# Patient Record
Sex: Female | Born: 1935 | Race: White | Hispanic: No | Smoking: Never smoker
Health system: Southern US, Community
[De-identification: ages and names within clinical notes are randomized; demographics above are authoritative.]

## PROBLEM LIST (undated history)

## (undated) DIAGNOSIS — I4821 Permanent atrial fibrillation: Secondary | ICD-10-CM

## (undated) DIAGNOSIS — I5033 Acute on chronic diastolic (congestive) heart failure: Secondary | ICD-10-CM

## (undated) DIAGNOSIS — E039 Hypothyroidism, unspecified: Secondary | ICD-10-CM

## (undated) DIAGNOSIS — D649 Anemia, unspecified: Secondary | ICD-10-CM

## (undated) DIAGNOSIS — E669 Obesity, unspecified: Secondary | ICD-10-CM

## (undated) DIAGNOSIS — I350 Nonrheumatic aortic (valve) stenosis: Secondary | ICD-10-CM

## (undated) DIAGNOSIS — Z952 Presence of prosthetic heart valve: Secondary | ICD-10-CM

## (undated) DIAGNOSIS — Z950 Presence of cardiac pacemaker: Secondary | ICD-10-CM

## (undated) DIAGNOSIS — C169 Malignant neoplasm of stomach, unspecified: Secondary | ICD-10-CM

## (undated) DIAGNOSIS — I739 Peripheral vascular disease, unspecified: Secondary | ICD-10-CM

## (undated) DIAGNOSIS — E785 Hyperlipidemia, unspecified: Secondary | ICD-10-CM

## (undated) DIAGNOSIS — N189 Chronic kidney disease, unspecified: Secondary | ICD-10-CM

## (undated) DIAGNOSIS — I2510 Atherosclerotic heart disease of native coronary artery without angina pectoris: Secondary | ICD-10-CM

## (undated) DIAGNOSIS — R001 Bradycardia, unspecified: Secondary | ICD-10-CM

## (undated) DIAGNOSIS — I5032 Chronic diastolic (congestive) heart failure: Secondary | ICD-10-CM

## (undated) DIAGNOSIS — R42 Dizziness and giddiness: Secondary | ICD-10-CM

## (undated) DIAGNOSIS — I6529 Occlusion and stenosis of unspecified carotid artery: Secondary | ICD-10-CM

## (undated) DIAGNOSIS — I10 Essential (primary) hypertension: Secondary | ICD-10-CM

## (undated) DIAGNOSIS — M1990 Unspecified osteoarthritis, unspecified site: Secondary | ICD-10-CM

## (undated) DIAGNOSIS — K219 Gastro-esophageal reflux disease without esophagitis: Secondary | ICD-10-CM

## (undated) HISTORY — DX: Bradycardia, unspecified: R00.1

## (undated) HISTORY — PX: WHIPPLE PROCEDURE: SHX2667

## (undated) HISTORY — DX: Obesity, unspecified: E66.9

## (undated) HISTORY — DX: Nonrheumatic aortic (valve) stenosis: I35.0

## (undated) HISTORY — PX: US ECHOCARDIOGRAPHY: HXRAD669

## (undated) HISTORY — DX: Chronic diastolic (congestive) heart failure: I50.32

## (undated) HISTORY — DX: Permanent atrial fibrillation: I48.21

## (undated) HISTORY — PX: EYE SURGERY: SHX253

## (undated) HISTORY — PX: CHOLECYSTECTOMY: SHX55

## (undated) HISTORY — PX: CAROTID ENDARTERECTOMY: SUR193

## (undated) HISTORY — DX: Occlusion and stenosis of unspecified carotid artery: I65.29

## (undated) HISTORY — PX: CATARACT EXTRACTION W/ INTRAOCULAR LENS IMPLANT: SHX1309

## (undated) HISTORY — PX: CORONARY ANGIOPLASTY: SHX604

## (undated) HISTORY — PX: VAGINAL HYSTERECTOMY: SUR661

## (undated) HISTORY — DX: Dizziness and giddiness: R42

## (undated) HISTORY — DX: Essential (primary) hypertension: I10

## (undated) HISTORY — DX: Acute on chronic diastolic (congestive) heart failure: I50.33

## (undated) HISTORY — DX: Peripheral vascular disease, unspecified: I73.9

## (undated) HISTORY — DX: Atherosclerotic heart disease of native coronary artery without angina pectoris: I25.10

## (undated) HISTORY — PX: CARDIOVASCULAR STRESS TEST: SHX262

## (undated) HISTORY — PX: INSERT / REPLACE / REMOVE PACEMAKER: SUR710

## (undated) HISTORY — DX: Hyperlipidemia, unspecified: E78.5

---

## 2012-06-28 DIAGNOSIS — K449 Diaphragmatic hernia without obstruction or gangrene: Secondary | ICD-10-CM | POA: Insufficient documentation

## 2012-06-28 DIAGNOSIS — C7A8 Other malignant neuroendocrine tumors: Secondary | ICD-10-CM | POA: Insufficient documentation

## 2012-08-09 DIAGNOSIS — I214 Non-ST elevation (NSTEMI) myocardial infarction: Secondary | ICD-10-CM | POA: Insufficient documentation

## 2012-12-10 DIAGNOSIS — D3A8 Other benign neuroendocrine tumors: Secondary | ICD-10-CM | POA: Insufficient documentation

## 2016-02-06 ENCOUNTER — Encounter: Payer: Self-pay | Admitting: Cardiology

## 2016-02-06 ENCOUNTER — Encounter (INDEPENDENT_AMBULATORY_CARE_PROVIDER_SITE_OTHER): Payer: Self-pay

## 2016-02-06 ENCOUNTER — Ambulatory Visit (INDEPENDENT_AMBULATORY_CARE_PROVIDER_SITE_OTHER): Payer: Medicare Other | Admitting: Cardiology

## 2016-02-06 VITALS — BP 162/78 | HR 57 | Ht 72.0 in | Wt 212.0 lb

## 2016-02-06 DIAGNOSIS — I4819 Other persistent atrial fibrillation: Secondary | ICD-10-CM

## 2016-02-06 DIAGNOSIS — I481 Persistent atrial fibrillation: Secondary | ICD-10-CM | POA: Diagnosis not present

## 2016-02-06 NOTE — Patient Instructions (Signed)
Medication Instructions:   Your physician recommends that you continue on your current medications as directed. Please refer to the Current Medication list given to you today.  Labwork:  none  Testing/Procedures:  none  Follow-Up:  Your physician recommends that you schedule a follow-up appointment in: 3 months.  Any Other Special Instructions Will Be Listed Below (If Applicable).  If you need a refill on your cardiac medications before your next appointment, please call your pharmacy. 

## 2016-02-06 NOTE — Progress Notes (Addendum)
Electrophysiology Office Note   Date:  02/06/2016   ID:  Theresa Cain, DOB Oct 24, 1926, MRN [id number]  PCP:  Theresa Ranch, MD  Cardiologist:  [facility] Primary Electrophysiologist:  Theresa Corso Meredith Leeds, MD    Chief Complaint  Patient presents with  . New Patient (Initial Visit)    PAF/Bradycardia     History of Present Illness: [name] is a 82 y.o. female who presents today for electrophysiology evaluation.   History of atrial fibrillation, carotid artery disease, hypertension, CVA, peripheral vascular disease. Presented to her primary cardiologist due to lightheadedness, vertigo, and she'll be headed feeling. She says that she was diagnosed with atrial fibrillation approximately a year ago. She was put on Eliquis at that time and has not missed any doses since starting the medication. She says that she has symptoms of fatigue, shortness of breath, and palpitations. She is able to do most of her daily activities without issue. According to her, she wore a monitor which showed evidence of bradycardia down to the 30s.   Today, she denies symptoms of palpitations, chest pain, orthopnea, PND, lower extremity edema, claudication, dizziness, presyncope, syncope, bleeding, or neurologic sequela. The patient is tolerating medications without difficulties and is otherwise without complaint today.    Past Medical History:  Diagnosis Date  . A-fib ([place])   . Angina at rest [facility])   . Anxiety   . Aortic valve stenosis   . Bilateral edema of lower extremity   . Bradycardia   . Carotid artery occlusion   . Hypertension   . Obesity   . PVD (peripheral vascular disease) ([place])   . SOB (shortness of breath)   . Vertigo    Past Surgical History:  Procedure Laterality Date  . CARDIOVASCULAR STRESS TEST    . CAROTID ENDARTERECTOMY    . US ECHOCARDIOGRAPHY       Current Outpatient Prescriptions  Medication Sig Dispense Refill  . amLODipine (NORVASC) 5 MG tablet Take 5 mg by  mouth daily.    Marland Kitchen apixaban (ELIQUIS) 5 MG TABS tablet Take 5 mg by mouth 2 (two) times daily.    Marland Kitchen aspirin EC 81 MG tablet Take 81 mg by mouth daily.    . Cholecalciferol (VITAMIN D3) 50000 units TABS Take 50,000 Units by mouth daily.    Marland Kitchen escitalopram (LEXAPRO) 10 MG tablet Take 10 mg by mouth daily.    Marland Kitchen gabapentin (NEURONTIN) 600 MG tablet Take 600 mg by mouth 2 (two) times daily.     . meclizine (ANTIVERT) 12.5 MG tablet Take 12.5 mg by mouth 3 (three) times daily as needed for dizziness (up to ten days).    Marland Kitchen omeprazole (PRILOSEC) 20 MG capsule Take 20 mg by mouth daily.    Marland Kitchen oxybutynin (DITROPAN-XL) 10 MG 24 hr tablet Take 10 mg by mouth at bedtime.    . pravastatin (PRAVACHOL) 40 MG tablet Take 40 mg by mouth daily.    Marland Kitchen torsemide (DEMADEX) 100 MG tablet Take 100 mg by mouth daily.     No current facility-administered medications for this visit.     Allergies:   Penicillins and Sulfa antibiotics   Social History:  The patient  reports that she has never smoked. She has never used smokeless tobacco. She reports that she does not drink alcohol or use drugs.   Family History:  The patient's family history includes Heart disease in her sister.    ROS:  Please see the history of present illness.   Otherwise, review of  systems is positive for none.   All other systems are reviewed and negative.    PHYSICAL EXAM: VS:  BP (!) 162/78   Pulse (!) 57   Ht 6' (1.829 m)   Wt 212 lb (96.2 kg)   BMI 28.75 kg/m  , BMI Body mass index is 28.75 kg/m. GEN: Well nourished, well developed, in no acute distress  HEENT: normal  Neck: no JVD, carotid bruits, or masses Cardiac: iRRR; 2/6 systolic murmur at LUSB, rubs, or gallops,no edema  Respiratory:  clear to auscultation bilaterally, normal work of breathing GI: soft, nontender, nondistended, + BS MS: no deformity or atrophy  Skin: warm and dry Neuro:  Strength and sensation are intact Psych: euthymic mood, full affect  EKG:  EKG is  ordered today. Personal review of the ekg ordered shows atrial fibrillation, rate 57  Recent Labs: No results found for requested labs within last 8760 hours.    Lipid Panel  No results found for: CHOL, TRIG, HDL, CHOLHDL, VLDL, LDLCALC, LDLDIRECT   Wt Readings from Last 3 Encounters:  02/06/16 212 lb (96.2 kg)      Other studies Reviewed: Additional studies/ records that were reviewed today include: 12/31/15 TTE Review of the above records today demonstrates:  LVEF>55% (see care everywhere)   ASSESSMENT AND PLAN:  1.  Paroxysmal atrial fibrillation: Currently anticoagulated with Eliquis. She is rate controlled on no rate controlling medicines at the time. We'll continue her on her current medical management. We did discuss the option of returning to sinus rhythm. We discussed medical management as well as cardioversion. We Tayonna Bacha await the results of the event monitor to determine the best course of action.  This patients CHA2DS2-VASc Score and unadjusted Ischemic Stroke Rate (% per year) is equal to 7.2 % stroke rate/year from a score of 5  Above score calculated as 1 point each if present [CHF, HTN, DM, Vascular=MI/PAD/Aortic Plaque, Age if 65-74, or Female] Above score calculated as 2 points each if present [Age > 75, or Stroke/TIA/TE]   2. Bradycardia: Patient reports that she has been bradycardic. Unfortunately we do not have the results of her most recent event monitor. We'll work to get those results. In the interim, I have discussed with both her and her daughter the possibility of a pacemaker being placed. Risks and benefits were discussed. Risks include bleeding, infection, tamponade, and pneumothorax, among others. They understand these risks and has agreed to the procedure if it is necessary pending monitor results.    Current medicines are reviewed at length with the patient today.   The patient does not have concerns regarding her medicines.  The following changes  were made today:  none  Labs/ tests ordered today include:  Orders Placed This Encounter  Procedures  . EKG 12-Lead     Disposition:   FU with Antwoin Lackey 3 months  Signed, Stashia Sia Meredith Leeds, MD  02/06/2016 3:53 PM     [facility] [address] [place] [address] [phone] (office) [phone] (fax)

## 2016-02-18 ENCOUNTER — Telehealth: Payer: Self-pay | Admitting: Cardiology

## 2016-02-18 NOTE — Telephone Encounter (Signed)
Advised dtr that we had not received "complete" holter monitor report.  Did inform her that we could see holter "summary" but not entire report.  Dtr will call and request they send entire holter report for review by physician.  She understands I will call her once received/reviewed.   [facility] note from care everywhere: 48 hour Holter Hookup date December 06, 2015 Indication atrial fibrillation Patient with a Holter for 48 hours Total beats 87,102 Minimum rate 22 maximum 113 average of 62 Rhythm is mostly atrial fibrillation with bradycardia Frequent PVCs Short pauses during A. Fib Significant episodes of bradycardia No significant runs of sustained tachycardia Occasional bigeminy No diary submitted . Conclusion Sick sinus syndrome with atrial fibrillation underlying rhythm Severe bradycardia down to 22 times mostly in the 50s Consider long-term anticoagulation if patient is a candidate Consider permanent pacemaker if symptoms persist

## 2016-02-18 NOTE — Telephone Encounter (Signed)
Follow Up:    Pt's daughter trying to find out if you received pt's monitor results from [facility]?

## 2016-02-20 ENCOUNTER — Telehealth: Payer: Self-pay | Admitting: *Deleted

## 2016-02-20 NOTE — Telephone Encounter (Signed)
Patients daughter calling to see if we have received complete holter monitor report from [place]. (see previous note).  Please call.

## 2016-02-21 NOTE — Telephone Encounter (Signed)
Dtr reports mom's SOB worsening and PCP is looking to see if she has PNA.  Hx of AFib, murmur also could be causative factor. Informed dtr we have received complete holter results.  She understands I will discuss mom's case w/ Dr. Curt Bears tomorrow/Monday and call her back by next week with a plan.

## 2016-02-21 NOTE — Telephone Encounter (Signed)
Call Documentation   Shelly A Wells at 02/20/2016 12:12 PM   Status: Signed    Patients daughter calling to see if we have received complete holter monitor report from [place]. (see previous note).  Please call.

## 2016-02-22 ENCOUNTER — Emergency Department (HOSPITAL_COMMUNITY): Payer: Medicare Other

## 2016-02-22 ENCOUNTER — Encounter (HOSPITAL_COMMUNITY): Payer: Self-pay | Admitting: *Deleted

## 2016-02-22 ENCOUNTER — Observation Stay (HOSPITAL_COMMUNITY)
Admission: EM | Admit: 2016-02-22 | Discharge: 2016-02-23 | Disposition: A | Discharge status: Home / Self Care | Payer: Medicare Other | Attending: Internal Medicine | Admitting: Internal Medicine

## 2016-02-22 DIAGNOSIS — T501X5A Adverse effect of loop [high-ceiling] diuretics, initial encounter: Secondary | ICD-10-CM | POA: Insufficient documentation

## 2016-02-22 DIAGNOSIS — Z7982 Long term (current) use of aspirin: Secondary | ICD-10-CM | POA: Insufficient documentation

## 2016-02-22 DIAGNOSIS — Z79899 Other long term (current) drug therapy: Secondary | ICD-10-CM | POA: Insufficient documentation

## 2016-02-22 DIAGNOSIS — R001 Bradycardia, unspecified: Secondary | ICD-10-CM | POA: Insufficient documentation

## 2016-02-22 DIAGNOSIS — D649 Anemia, unspecified: Secondary | ICD-10-CM | POA: Diagnosis not present

## 2016-02-22 DIAGNOSIS — Z8673 Personal history of transient ischemic attack (TIA), and cerebral infarction without residual deficits: Secondary | ICD-10-CM | POA: Diagnosis not present

## 2016-02-22 DIAGNOSIS — I5033 Acute on chronic diastolic (congestive) heart failure: Secondary | ICD-10-CM | POA: Diagnosis not present

## 2016-02-22 DIAGNOSIS — I739 Peripheral vascular disease, unspecified: Secondary | ICD-10-CM | POA: Diagnosis not present

## 2016-02-22 DIAGNOSIS — I350 Nonrheumatic aortic (valve) stenosis: Secondary | ICD-10-CM | POA: Insufficient documentation

## 2016-02-22 DIAGNOSIS — F419 Anxiety disorder, unspecified: Secondary | ICD-10-CM | POA: Diagnosis not present

## 2016-02-22 DIAGNOSIS — I480 Paroxysmal atrial fibrillation: Secondary | ICD-10-CM | POA: Diagnosis not present

## 2016-02-22 DIAGNOSIS — I110 Hypertensive heart disease with heart failure: Secondary | ICD-10-CM | POA: Diagnosis not present

## 2016-02-22 DIAGNOSIS — Z7901 Long term (current) use of anticoagulants: Secondary | ICD-10-CM | POA: Diagnosis not present

## 2016-02-22 DIAGNOSIS — I509 Heart failure, unspecified: Secondary | ICD-10-CM

## 2016-02-22 DIAGNOSIS — E876 Hypokalemia: Secondary | ICD-10-CM | POA: Diagnosis not present

## 2016-02-22 DIAGNOSIS — R0602 Shortness of breath: Secondary | ICD-10-CM | POA: Diagnosis present

## 2016-02-22 HISTORY — DX: Acute on chronic diastolic (congestive) heart failure: I50.33

## 2016-02-22 LAB — BASIC METABOLIC PANEL
ANION GAP: 9 (ref 5–15)
BUN: 7 mg/dL (ref 6–20)
CALCIUM: 8.5 mg/dL — AB (ref 8.9–10.3)
CO2: 26 mmol/L (ref 22–32)
Chloride: 104 mmol/L (ref 101–111)
Creatinine, Ser: 1 mg/dL (ref 0.44–1.00)
GFR, EST AFRICAN AMERICAN: 57 mL/min — AB (ref >60)
GFR, EST NON AFRICAN AMERICAN: 49 mL/min — AB (ref >60)
Glucose, Bld: 120 mg/dL — ABNORMAL HIGH (ref 65–99)
Potassium: 3.5 mmol/L (ref 3.5–5.1)
Sodium: 139 mmol/L (ref 135–145)

## 2016-02-22 LAB — BRAIN NATRIURETIC PEPTIDE: B NATRIURETIC PEPTIDE 5: 390 pg/mL — AB (ref 0.0–100.0)

## 2016-02-22 LAB — CBC
HCT: 29.7 % — ABNORMAL LOW (ref 36.0–46.0)
HEMOGLOBIN: 9 g/dL — AB (ref 12.0–15.0)
MCH: 24.5 pg — ABNORMAL LOW (ref 26.0–34.0)
MCHC: 30.3 g/dL (ref 30.0–36.0)
MCV: 80.7 fL (ref 78.0–100.0)
Platelets: 157 10*3/uL (ref 150–400)
RBC: 3.68 MIL/uL — AB (ref 3.87–5.11)
RDW: 16.7 % — ABNORMAL HIGH (ref 11.5–15.5)
WBC: 6.7 10*3/uL (ref 4.0–10.5)

## 2016-02-22 LAB — I-STAT TROPONIN, ED: TROPONIN I, POC: 0 ng/mL (ref 0.00–0.08)

## 2016-02-22 MED ORDER — PRAVASTATIN SODIUM 40 MG PO TABS
40.0000 mg | ORAL_TABLET | Freq: Every evening | ORAL | Status: DC
  Administered 2016-02-22: 40 mg via ORAL
  Filled 2016-02-22: qty 1

## 2016-02-22 MED ORDER — APIXABAN 5 MG PO TABS
5.0000 mg | ORAL_TABLET | Freq: Two times a day (BID) | ORAL | Status: DC
  Administered 2016-02-22 – 2016-02-23 (×2): 5 mg via ORAL
  Filled 2016-02-22 (×2): qty 1

## 2016-02-22 MED ORDER — AMLODIPINE BESYLATE 5 MG PO TABS
5.0000 mg | ORAL_TABLET | Freq: Every day | ORAL | Status: DC
  Administered 2016-02-23: 5 mg via ORAL
  Filled 2016-02-22: qty 1

## 2016-02-22 MED ORDER — FUROSEMIDE 10 MG/ML IJ SOLN
80.0000 mg | Freq: Once | INTRAMUSCULAR | Status: AC
  Administered 2016-02-22: 80 mg via INTRAVENOUS
  Filled 2016-02-22: qty 8

## 2016-02-22 MED ORDER — SODIUM CHLORIDE 0.9% FLUSH
3.0000 mL | Freq: Two times a day (BID) | INTRAVENOUS | Status: DC
  Administered 2016-02-22 – 2016-02-23 (×2): 3 mL via INTRAVENOUS

## 2016-02-22 MED ORDER — TORSEMIDE 20 MG PO TABS
20.0000 mg | ORAL_TABLET | Freq: Every day | ORAL | Status: DC
Start: 2016-02-23 — End: 2016-02-22

## 2016-02-22 MED ORDER — ESCITALOPRAM OXALATE 10 MG PO TABS
10.0000 mg | ORAL_TABLET | Freq: Every day | ORAL | Status: DC
  Administered 2016-02-22: 10 mg via ORAL
  Filled 2016-02-22: qty 1

## 2016-02-22 MED ORDER — ASPIRIN EC 81 MG PO TBEC
81.0000 mg | DELAYED_RELEASE_TABLET | Freq: Every day | ORAL | Status: DC
  Administered 2016-02-23: 81 mg via ORAL
  Filled 2016-02-22: qty 1

## 2016-02-22 MED ORDER — FUROSEMIDE 10 MG/ML IJ SOLN
40.0000 mg | Freq: Every day | INTRAMUSCULAR | Status: DC
  Administered 2016-02-23: 40 mg via INTRAVENOUS
  Filled 2016-02-22: qty 4

## 2016-02-22 MED ORDER — GABAPENTIN 600 MG PO TABS
1200.0000 mg | ORAL_TABLET | Freq: Two times a day (BID) | ORAL | Status: DC
  Administered 2016-02-22 – 2016-02-23 (×2): 1200 mg via ORAL
  Filled 2016-02-22 (×2): qty 2

## 2016-02-22 MED ORDER — PANTOPRAZOLE SODIUM 40 MG PO TBEC
40.0000 mg | DELAYED_RELEASE_TABLET | Freq: Every day | ORAL | Status: DC
  Administered 2016-02-23: 40 mg via ORAL
  Filled 2016-02-22: qty 1

## 2016-02-22 MED ORDER — ESCITALOPRAM OXALATE 10 MG PO TABS
ORAL_TABLET | ORAL | Status: AC
  Administered 2016-02-23: 10 mg
  Filled 2016-02-22: qty 1

## 2016-02-22 MED ORDER — LEVOFLOXACIN 500 MG PO TABS
500.0000 mg | ORAL_TABLET | Freq: Every day | ORAL | Status: DC
  Administered 2016-02-23: 500 mg via ORAL
  Filled 2016-02-22: qty 1

## 2016-02-22 MED ORDER — PRAVASTATIN SODIUM 40 MG PO TABS
ORAL_TABLET | ORAL | Status: AC
  Administered 2016-02-23: 40 mg
  Filled 2016-02-22: qty 1

## 2016-02-22 MED ORDER — SODIUM CHLORIDE 0.9 % IV SOLN: 250.0000 mL | INTRAVENOUS | Status: DC | PRN

## 2016-02-22 MED ORDER — ACETAMINOPHEN 500 MG PO TABS: 1000.0000 mg | ORAL_TABLET | Freq: Four times a day (QID) | ORAL | Status: DC | PRN

## 2016-02-22 MED ORDER — OXYBUTYNIN CHLORIDE ER 10 MG PO TB24
10.0000 mg | ORAL_TABLET | Freq: Every day | ORAL | Status: DC
  Administered 2016-02-22: 10 mg via ORAL
  Filled 2016-02-22 (×2): qty 1

## 2016-02-22 MED ORDER — ONDANSETRON HCL 4 MG/2ML IJ SOLN
4.0000 mg | Freq: Four times a day (QID) | INTRAMUSCULAR | Status: DC | PRN
Start: 2016-02-22 — End: 2016-02-23

## 2016-02-22 MED ORDER — SODIUM CHLORIDE 0.9% FLUSH: 3.0000 mL | INTRAVENOUS | Status: DC | PRN

## 2016-02-22 NOTE — ED Provider Notes (Signed)
Medical screening examination/treatment/procedure(s) were conducted as a shared visit with non-physician practitioner(s) and myself.  I personally evaluated the patient during the encounter.   EKG Interpretation  Date/Time:  Friday February 22 2016 14:47:27 EST Ventricular Rate:  76 PR Interval:    QRS Duration: 76 QT Interval:  400 QTC Calculation: 450 R Axis:   -20 Text Interpretation:  Atrial fibrillation Septal infarct , age undetermined Abnormal ECG Confirmed by Hashem Goynes  MD, Esaw Knippel ([id number]) on 02/22/2016 9:31:03 PM     Patient here with increasing shortness of breath times several days. Also noted swelling to her extremities. X-rays show CHF. Given Lasix here is doing better initially but when ambulating became more dyspneic. Will admit for diuresis   Lacretia Leigh, MD 02/22/16 2131

## 2016-02-22 NOTE — ED Notes (Signed)
Dr Gardner in room 

## 2016-02-22 NOTE — ED Notes (Signed)
Pt given a warm blanket. No questions at this time.

## 2016-02-22 NOTE — ED Notes (Signed)
This RN in room to speak with pt. Pt sitting up in the chair at the bedside and off the monitor. Pt in NAD. Pt and daughter stated to this RN that they are not happy about being admitted. This RN provided education to both about information about admission.

## 2016-02-22 NOTE — H&P (Signed)
History and Physical    Theresa Cain [id number] DOB: 1926-04-07 DOA: 02/22/2016   PCP: Kendrick Ranch, MD Chief Complaint:  Chief Complaint  Patient presents with  . Shortness of Breath  . Chest Pain    HPI: Theresa Cain is a 82 y.o. female with medical history significant of a.fib, AVS, bradycardia currently being evaluated for pacer.  Patient hadnt been feeling well over last week.  Got put on levaquin.  Thought she was getting dehydrated so stopped her torsemide on her own.  She became increasingly SOB since stopping torsemide over the weekend.  Presents to ED.  ED Course: CHF.  Review of Systems: As per HPI otherwise 10 point review of systems negative.    Past Medical History:  Diagnosis Date  . A-fib ([place])   . Angina at rest [facility])   . Anxiety   . Aortic valve stenosis   . Bilateral edema of lower extremity   . Bradycardia   . Carotid artery occlusion   . Hypertension   . Obesity   . PVD (peripheral vascular disease) ([place])   . SOB (shortness of breath)   . Vertigo     Past Surgical History:  Procedure Laterality Date  . CARDIOVASCULAR STRESS TEST    . CAROTID ENDARTERECTOMY    . US ECHOCARDIOGRAPHY       reports that she has never smoked. She has never used smokeless tobacco. She reports that she does not drink alcohol or use drugs.  Allergies  Allergen Reactions  . Penicillins Hives  . Sulfa Antibiotics Hives    Family History  Problem Relation Age of Onset  . Heart disease Sister       Prior to Admission medications   Medication Sig Start Date End Date Taking? Authorizing Provider  acetaminophen (TYLENOL) 500 MG tablet Take 1,000 mg by mouth every 6 (six) hours as needed for moderate pain.   Yes Historical Provider, MD  amLODipine (NORVASC) 5 MG tablet Take 5 mg by mouth daily.   Yes Historical Provider, MD  apixaban (ELIQUIS) 5 MG TABS tablet Take 5 mg by mouth 2 (two) times daily.   Yes Historical Provider, MD  aspirin EC 81 MG  tablet Take 81 mg by mouth daily.   Yes Historical Provider, MD  DM-Doxylamine-Acetaminophen (NYQUIL COLD & FLU PO) Take 10 mLs by mouth at bedtime as needed (COLD).   Yes Historical Provider, MD  escitalopram (LEXAPRO) 10 MG tablet Take 10 mg by mouth at bedtime.    Yes Historical Provider, MD  gabapentin (NEURONTIN) 600 MG tablet Take 1,200 mg by mouth 2 (two) times daily.    Yes Historical Provider, MD  levofloxacin (LEVAQUIN) 500 MG tablet Take 500 mg by mouth daily.   Yes Historical Provider, MD  Multiple Vitamin (MULTIVITAMIN WITH MINERALS) TABS tablet Take 1 tablet by mouth daily.   Yes Historical Provider, MD  omeprazole (PRILOSEC) 20 MG capsule Take 20 mg by mouth daily.   Yes Historical Provider, MD  oxybutynin (DITROPAN-XL) 10 MG 24 hr tablet Take 10 mg by mouth at bedtime.   Yes Historical Provider, MD  pravastatin (PRAVACHOL) 40 MG tablet Take 40 mg by mouth every evening.    Yes Historical Provider, MD  torsemide (DEMADEX) 20 MG tablet Take 20 mg by mouth daily.   Yes Historical Provider, MD  Vitamin D, Ergocalciferol, (DRISDOL) 50000 units CAPS capsule Take 50,000 Units by mouth 2 (two) times a week.   Yes Historical Provider, MD    Physical Exam: Vitals:  02/22/16 2030 02/22/16 2115 02/22/16 2145 02/22/16 2200  BP: 169/90 156/79 147/74 167/57  Pulse: 61 80 73 80  Resp: 22 17 19 17   Temp:      TempSrc:      SpO2: 98% 94% 99% 96%  Height:          Constitutional: NAD, calm, comfortable Eyes: PERRL, lids and conjunctivae normal ENMT: Mucous membranes are moist. Posterior pharynx clear of any exudate or lesions.Normal dentition.  Neck: normal, supple, no masses, no thyromegaly Respiratory: clear to auscultation bilaterally, no wheezing, no crackles. Normal respiratory effort. No accessory muscle use.  Cardiovascular: Regular rate and rhythm, no murmurs / rubs / gallops. No extremity edema. 2+ pedal pulses. No carotid bruits.  Abdomen: no tenderness, no masses palpated.  No hepatosplenomegaly. Bowel sounds positive.  Musculoskeletal: no clubbing / cyanosis. No joint deformity upper and lower extremities. Good ROM, no contractures. Normal muscle tone.  Skin: no rashes, lesions, ulcers. No induration Neurologic: CN 2-12 grossly intact. Sensation intact, DTR normal. Strength 5/5 in all 4.  Psychiatric: Normal judgment and insight. Alert and oriented x 3. Normal mood.    Labs on Admission: I have personally reviewed following labs and imaging studies  CBC:  Recent Labs Lab 02/22/16 1454  WBC 6.7  HGB 9.0*  HCT 29.7*  MCV 80.7  PLT [id number]   Basic Metabolic Panel:  Recent Labs Lab 02/22/16 1454  NA 139  K 3.5  CL 104  CO2 26  GLUCOSE 120*  BUN 7  CREATININE 1.00  CALCIUM 8.5*   GFR: CrCl cannot be calculated (Unknown ideal weight.). Liver Function Tests: No results for input(s): AST, ALT, ALKPHOS, BILITOT, PROT, ALBUMIN in the last 168 hours. No results for input(s): LIPASE, AMYLASE in the last 168 hours. No results for input(s): AMMONIA in the last 168 hours. Coagulation Profile: No results for input(s): INR, PROTIME in the last 168 hours. Cardiac Enzymes: No results for input(s): CKTOTAL, CKMB, CKMBINDEX, TROPONINI in the last 168 hours. BNP (last 3 results) No results for input(s): PROBNP in the last 8760 hours. HbA1C: No results for input(s): HGBA1C in the last 72 hours. CBG: No results for input(s): GLUCAP in the last 168 hours. Lipid Profile: No results for input(s): CHOL, HDL, LDLCALC, TRIG, CHOLHDL, LDLDIRECT in the last 72 hours. Thyroid Function Tests: No results for input(s): TSH, T4TOTAL, FREET4, T3FREE, THYROIDAB in the last 72 hours. Anemia Panel: No results for input(s): VITAMINB12, FOLATE, FERRITIN, TIBC, IRON, RETICCTPCT in the last 72 hours. Urine analysis: No results found for: COLORURINE, APPEARANCEUR, LABSPEC, PHURINE, GLUCOSEU, HGBUR, BILIRUBINUR, KETONESUR, PROTEINUR, UROBILINOGEN, NITRITE, LEUKOCYTESUR Sepsis  Labs: @LABRCNTIP (procalcitonin:4,lacticidven:4) )No results found for this or any previous visit (from the past 240 hour(s)).   Radiological Exams on Admission: Dg Chest 2 View  Result Date: 02/22/2016 CLINICAL DATA:  Cough for 8 days EXAM: CHEST  2 VIEW COMPARISON:  None. FINDINGS: There is interstitial pulmonary edema. There is no airspace consolidation. There is cardiomegaly with pulmonary venous hypertension. There is atherosclerotic calcification in the aorta. No adenopathy. There is degenerative change in the thoracic spine. There is postoperative change in the right neck region. IMPRESSION: There is evidence of a degree of congestive heart failure. There is aortic atherosclerosis. Electronically Signed   By: Lowella Grip III M.D.   On: 02/22/2016 15:56    EKG: Independently reviewed.  Assessment/Plan Active Problems:   Acute on chronic diastolic CHF (congestive heart failure) ([place])    1. Acute on chronic diastolic CHF - 1.  Admitting for overnight obs 2. CHF pathway 3. Lasix 40mg  IV daily, got 40mg  IV in ED with large volume output 4. Patient actually feels better and wants to go home but EDP is concerned about desat with ambulation to upper 80s. 5. EF 55% in October   DVT prophylaxis: Eliquis Code Status: Full Family Communication: Daughter at bedside Consults called: None Admission status: Admit to obs   Rula Ken[place], [facility] Pager [phone] from 7PM-7AM  If 7AM-7PM, please contact the day physician for the patient [email or web address] Password [facility]  02/22/2016, 10:49 PM

## 2016-02-22 NOTE — ED Triage Notes (Signed)
Pt reports sob since Tuesday. Denies swelling to extremities. Has been to pcp and had xray done which showed fluid on her lungs. Denies hx of chf. Has occ chest pains. ekg done at triage and no resp distress noted, pt is able to speak in full sentences.

## 2016-02-22 NOTE — ED Provider Notes (Signed)
[facility] DEPT Provider Note   CSN: [id number] Arrival date & time: 02/22/16  1442     History   Chief Complaint Chief Complaint  Patient presents with  . Shortness of Breath  . Chest Pain   HPI  Blood pressure 153/76, pulse 75, temperature 98.1 F (36.7 C), temperature source Oral, resp. rate 17, height 5\' 11"  (1.803 m), SpO2 94 %.  Theresa Cain is a 82 y.o. female with past medical history of A. fib with bradycardia (anticoagulated with Xarelto, scheduled pacemaker pending echo), aortic valve stenosis, hypertension, PVD complaining of shortness of breath worsening over the course of several days associated with dry cough no fever chills, reduced by mouth intake. Patient self DC'd her torsemide because she felt like she wasn't drinking a lot of water. She denies orthopnea, worsening peripheral edema. Endorses PND. Has fleeting chest pain, no active chest pain at this time. Patient was started on Levaquin for presumed pneumonia, she's had 2 doses of 500 mg.  Past Medical History:  Diagnosis Date  . A-fib ([place])   . Angina at rest [facility])   . Anxiety   . Aortic valve stenosis   . Bilateral edema of lower extremity   . Bradycardia   . Carotid artery occlusion   . Hypertension   . Obesity   . PVD (peripheral vascular disease) ([place])   . SOB (shortness of breath)   . Vertigo     There are no active problems to display for this patient.   Past Surgical History:  Procedure Laterality Date  . CARDIOVASCULAR STRESS TEST    . CAROTID ENDARTERECTOMY    . US ECHOCARDIOGRAPHY      OB History    No data available       Home Medications    Prior to Admission medications   Medication Sig Start Date End Date Taking? Authorizing Provider  acetaminophen (TYLENOL) 500 MG tablet Take 1,000 mg by mouth every 6 (six) hours as needed for moderate pain.   Yes Historical Provider, MD  amLODipine (NORVASC) 5 MG tablet Take 5 mg by mouth daily.   Yes Historical Provider, MD  apixaban  (ELIQUIS) 5 MG TABS tablet Take 5 mg by mouth 2 (two) times daily.   Yes Historical Provider, MD  aspirin EC 81 MG tablet Take 81 mg by mouth daily.   Yes Historical Provider, MD  DM-Doxylamine-Acetaminophen (NYQUIL COLD & FLU PO) Take 10 mLs by mouth at bedtime as needed (COLD).   Yes Historical Provider, MD  escitalopram (LEXAPRO) 10 MG tablet Take 10 mg by mouth at bedtime.    Yes Historical Provider, MD  gabapentin (NEURONTIN) 600 MG tablet Take 1,200 mg by mouth 2 (two) times daily.    Yes Historical Provider, MD  levofloxacin (LEVAQUIN) 500 MG tablet Take 500 mg by mouth daily.   Yes Historical Provider, MD  Multiple Vitamin (MULTIVITAMIN WITH MINERALS) TABS tablet Take 1 tablet by mouth daily.   Yes Historical Provider, MD  omeprazole (PRILOSEC) 20 MG capsule Take 20 mg by mouth daily.   Yes Historical Provider, MD  oxybutynin (DITROPAN-XL) 10 MG 24 hr tablet Take 10 mg by mouth at bedtime.   Yes Historical Provider, MD  pravastatin (PRAVACHOL) 40 MG tablet Take 40 mg by mouth every evening.    Yes Historical Provider, MD  torsemide (DEMADEX) 20 MG tablet Take 20 mg by mouth daily.   Yes Historical Provider, MD  Vitamin D, Ergocalciferol, (DRISDOL) 50000 units CAPS capsule Take 50,000 Units by mouth  2 (two) times a week.   Yes Historical Provider, MD    Family History Family History  Problem Relation Age of Onset  . Heart disease Sister     Social History Social History  Substance Use Topics  . Smoking status: Never Smoker  . Smokeless tobacco: Never Used  . Alcohol use No     Allergies   Penicillins and Sulfa antibiotics   Review of Systems Review of Systems  10 systems reviewed and found to be negative, except as noted in the HPI.  Physical Exam Updated Vital Signs BP 169/90   Pulse 61   Temp 98.1 F (36.7 C) (Oral)   Resp 22   Ht 5\' 11"  (1.803 m)   LMP  (Exact Date)   SpO2 98%   Physical Exam  Constitutional: She is oriented to person, place, and time. She  appears well-developed and well-nourished. No distress.  HENT:  Head: Normocephalic and atraumatic.  Mouth/Throat: Oropharynx is clear and moist.  Eyes: Conjunctivae and EOM are normal. Pupils are equal, round, and reactive to light.  Neck: Normal range of motion.  Cardiovascular: Normal rate, regular rhythm and intact distal pulses.   Murmur heard. Pulmonary/Chest: Effort normal and breath sounds normal.  Abdominal: Soft. There is no tenderness.  Musculoskeletal: Normal range of motion. She exhibits edema.  1+ pitting edema to ankles bilaterally  Neurological: She is alert and oriented to person, place, and time.  Skin: She is not diaphoretic.  Psychiatric: She has a normal mood and affect.  Nursing note and vitals reviewed.    ED Treatments / Results  Labs (all labs ordered are listed, but only abnormal results are displayed) Labs Reviewed  BASIC METABOLIC PANEL - Abnormal; Notable for the following:       Result Value   Glucose, Bld 120 (*)    Calcium 8.5 (*)    GFR calc non Af Amer 49 (*)    GFR calc Af Amer 57 (*)    All other components within normal limits  CBC - Abnormal; Notable for the following:    RBC 3.68 (*)    Hemoglobin 9.0 (*)    HCT 29.7 (*)    MCH 24.5 (*)    RDW 16.7 (*)    All other components within normal limits  BRAIN NATRIURETIC PEPTIDE - Abnormal; Notable for the following:    B Natriuretic Peptide 390.0 (*)    All other components within normal limits  I-STAT TROPOININ, ED    EKG  EKG Interpretation  Date/Time:  Friday February 22 2016 14:47:27 EST Ventricular Rate:  76 PR Interval:    QRS Duration: 76 QT Interval:  400 QTC Calculation: 450 R Axis:   -20 Text Interpretation:  Atrial fibrillation Septal infarct , age undetermined Abnormal ECG Confirmed by Zenia Resides  MD, ANTHONY ([id number]) on 02/22/2016 9:31:03 PM       Radiology Dg Chest 2 View  Result Date: 02/22/2016 CLINICAL DATA:  Cough for 8 days EXAM: CHEST  2 VIEW COMPARISON:   None. FINDINGS: There is interstitial pulmonary edema. There is no airspace consolidation. There is cardiomegaly with pulmonary venous hypertension. There is atherosclerotic calcification in the aorta. No adenopathy. There is degenerative change in the thoracic spine. There is postoperative change in the right neck region. IMPRESSION: There is evidence of a degree of congestive heart failure. There is aortic atherosclerosis. Electronically Signed   By: Lowella Grip III M.D.   On: 02/22/2016 15:56    Procedures Procedures (including critical  care time)  Medications Ordered in ED Medications  levofloxacin (LEVAQUIN) tablet 500 mg (not administered)  apixaban (ELIQUIS) tablet 5 mg (not administered)  amLODipine (NORVASC) tablet 5 mg (not administered)  acetaminophen (TYLENOL) tablet 1,000 mg (not administered)  aspirin EC tablet 81 mg (not administered)  escitalopram (LEXAPRO) tablet 10 mg (not administered)  gabapentin (NEURONTIN) tablet 1,200 mg (not administered)  torsemide (DEMADEX) tablet 20 mg (not administered)  pravastatin (PRAVACHOL) tablet 40 mg (not administered)  oxybutynin (DITROPAN-XL) 24 hr tablet 10 mg (not administered)  pantoprazole (PROTONIX) EC tablet 40 mg (not administered)  furosemide (LASIX) injection 80 mg (80 mg Intravenous Given 02/22/16 1933)     Initial Impression / Assessment and Plan / ED Course  I have reviewed the triage vital signs and the nursing notes.  Pertinent labs & imaging results that were available during my care of the patient were reviewed by me and considered in my medical decision making (see chart for details).  Clinical Course     Vitals:   02/22/16 1900 02/22/16 1930 02/22/16 2015 02/22/16 2030  BP: 164/96 163/86 (!) 135/101 169/90  Pulse: 80 85 79 61  Resp: 21 25 17 22   Temp:      TempSrc:      SpO2: 93% 94% 98% 98%  Height:        Medications  levofloxacin (LEVAQUIN) tablet 500 mg (not administered)  apixaban (ELIQUIS)  tablet 5 mg (not administered)  amLODipine (NORVASC) tablet 5 mg (not administered)  acetaminophen (TYLENOL) tablet 1,000 mg (not administered)  aspirin EC tablet 81 mg (not administered)  escitalopram (LEXAPRO) tablet 10 mg (not administered)  gabapentin (NEURONTIN) tablet 1,200 mg (not administered)  torsemide (DEMADEX) tablet 20 mg (not administered)  pravastatin (PRAVACHOL) tablet 40 mg (not administered)  oxybutynin (DITROPAN-XL) 24 hr tablet 10 mg (not administered)  pantoprazole (PROTONIX) EC tablet 40 mg (not administered)  furosemide (LASIX) injection 80 mg (80 mg Intravenous Given 02/22/16 1933)    Theresa Cain is 82 y.o. female presenting with Shortness of breath, she has intermittent chest pain but she is chest pain-free at this time. This patient self DC'd her torsemide because she felt like she wasn't drinking enough water to necessitate a water pill. Chest x-rays consistent with CHF. Patient is given IV Lasix 80 mg and she has urinated quite a bit, she reports significant subjective improvement however, when patient ambulates she stays around 87%. Will need admission for further diuresis. Case discussed with Dr. Alcario Drought who accepts admission.  Final Clinical Impressions(s) / ED Diagnoses   Final diagnoses:  Acute on chronic congestive heart failure, unspecified congestive heart failure type [facility])    New Prescriptions New Prescriptions   No medications on file     Monico Blitz, PA-C 02/22/16 2217    Forde Dandy, MD 02/23/16 [id number]

## 2016-02-22 NOTE — ED Notes (Signed)
Dr Alcario Drought, This RN and Elmyra Ricks PA in room to speak with pt and daughter about admission and pt and

## 2016-02-23 DIAGNOSIS — I5033 Acute on chronic diastolic (congestive) heart failure: Secondary | ICD-10-CM | POA: Diagnosis not present

## 2016-02-23 DIAGNOSIS — I110 Hypertensive heart disease with heart failure: Secondary | ICD-10-CM | POA: Diagnosis not present

## 2016-02-23 LAB — BASIC METABOLIC PANEL
ANION GAP: 11 (ref 5–15)
BUN: 8 mg/dL (ref 6–20)
CALCIUM: 8.2 mg/dL — AB (ref 8.9–10.3)
CHLORIDE: 101 mmol/L (ref 101–111)
CO2: 27 mmol/L (ref 22–32)
Creatinine, Ser: 1.03 mg/dL — ABNORMAL HIGH (ref 0.44–1.00)
GFR calc non Af Amer: 47 mL/min — ABNORMAL LOW (ref >60)
GFR, EST AFRICAN AMERICAN: 55 mL/min — AB (ref >60)
GLUCOSE: 126 mg/dL — AB (ref 65–99)
POTASSIUM: 3.1 mmol/L — AB (ref 3.5–5.1)
Sodium: 139 mmol/L (ref 135–145)

## 2016-02-23 MED ORDER — POTASSIUM CHLORIDE CRYS ER 20 MEQ PO TBCR
40.0000 meq | EXTENDED_RELEASE_TABLET | Freq: Once | ORAL | Status: AC
  Administered 2016-02-23: 40 meq via ORAL
  Filled 2016-02-23: qty 2

## 2016-02-23 MED ORDER — INFLUENZA VAC SPLIT QUAD 0.5 ML IM SUSY: 0.5000 mL | PREFILLED_SYRINGE | INTRAMUSCULAR | Status: DC

## 2016-02-23 NOTE — Progress Notes (Signed)
SATURATION QUALIFICATIONS: (This note is used to comply with regulatory documentation for home oxygen)  Patient Saturations on Room Air at Rest = 92%  Patient Saturations on Room Air while Ambulating = 92%   

## 2016-02-23 NOTE — Discharge Instructions (Signed)
Heart Failure °Heart failure is a condition in which the heart has trouble pumping blood because it has become weak or stiff. This means that the heart does not pump blood efficiently for the body to work well. For some people with heart failure, fluid may back up into the lungs and there may be swelling (edema) in the lower legs. Heart failure is usually a long-term (chronic) condition. It is important for you to take good care of yourself and follow the treatment plan from your health care provider. °What are the causes? °This condition is caused by some health problems, including: °· High blood pressure (hypertension). Hypertension causes the heart muscle to work harder than normal. High blood pressure eventually causes the heart to become stiff and weak. °· Coronary artery disease (CAD). CAD is the buildup of cholesterol and fat (plaques) in the arteries of the heart. °· Heart attack (myocardial infarction). Injured tissue, which is caused by the heart attack, does not contract as well and the heart's ability to pump blood is weakened. °· Abnormal heart valves. When the heart valves do not open and close properly, the heart muscle must pump harder to keep the blood flowing. °· Heart muscle disease (cardiomyopathy or myocarditis). Heart muscle disease is damage to the heart muscle from a variety of causes, such as drug or alcohol abuse, infections, or unknown causes. These can increase the risk of heart failure. °· Lung disease. When the lungs do not work properly, the heart must work harder. ° °What increases the risk? °Risk of heart failure increases as a person ages. This condition is also more likely to develop in people who: °· Are overweight. °· Are female. °· Smoke or chew tobacco. °· Abuse alcohol or illegal drugs. °· Have taken medicines that can damage the heart, such as chemotherapy drugs. °· Have diabetes. °? High blood sugar (glucose) is associated with high fat (lipid) levels in the blood. °? Diabetes  can also damage tiny blood vessels that carry nutrients to the heart muscle. °· Have abnormal heart rhythms. °· Have thyroid problems. °· Have low blood counts (anemia). ° °What are the signs or symptoms? °Symptoms of this condition include: °· Shortness of breath with activity, such as when climbing stairs. °· Persistent cough. °· Swelling of the feet, ankles, legs, or abdomen. °· Unexplained weight gain. °· Difficulty breathing when lying flat (orthopnea). °· Waking from sleep because of the need to sit up and get more air. °· Rapid heartbeat. °· Fatigue and loss of energy. °· Feeling light-headed, dizzy, or close to fainting. °· Loss of appetite. °· Nausea. °· Increased urination during the night (nocturia). °· Confusion. ° °How is this diagnosed? °This condition is diagnosed based on: °· Medical history, symptoms, and a physical exam. °· Diagnostic tests, which may include: °? Echocardiogram. °? Electrocardiogram (ECG). °? Chest X-ray. °? Blood tests. °? Exercise stress test. °? Radionuclide scans. °? Cardiac catheterization and angiogram. ° °How is this treated? °Treatment for this condition is aimed at managing the symptoms of heart failure. Medicines, behavioral changes, or other treatments may be necessary to treat heart failure. °Medicines °These may include: °· Angiotensin-converting enzyme (ACE) inhibitors. This type of medicine blocks the effects of a blood protein called angiotensin-converting enzyme. ACE inhibitors relax (dilate) the blood vessels and help to lower blood pressure. °· Angiotensin receptor blockers (ARBs). This type of medicine blocks the actions of a blood protein called angiotensin. ARBs dilate the blood vessels and help to lower blood pressure. °· Water   pills (diuretics). Diuretics cause the kidneys to remove salt and water from the blood. The extra fluid is removed through urination, leaving a lower volume of blood that the heart has to pump. °· Beta blockers. These improve heart  muscle strength and they prevent the heart from beating too quickly. °· Digoxin. This increases the force of the heartbeat. ° °Healthy behavior changes °These may include: °· Reaching and maintaining a healthy weight. °· Stopping smoking or chewing tobacco. °· Eating heart-healthy foods. °· Limiting or avoiding alcohol. °· Stopping use of street drugs (illegal drugs). °· Physical activity. ° °Other treatments °These may include: °· Surgery to open blocked coronary arteries or repair damaged heart valves. °· Placement of a biventricular pacemaker to improve heart muscle function (cardiac resynchronization therapy). This device paces both the right ventricle and left ventricle. °· Placement of a device to treat serious abnormal heart rhythms (implantable cardioverter defibrillator, or ICD). °· Placement of a device to improve the pumping ability of the heart (left ventricular assist device, or LVAD). °· Heart transplant. This can cure heart failure, and it is considered for certain patients who do not improve with other therapies. ° °Follow these instructions at home: °Medicines °· Take over-the-counter and prescription medicines only as told by your health care provider. Medicines are important in reducing the workload of your heart, slowing the progression of heart failure, and improving your symptoms. °? Do not stop taking your medicine unless your health care provider told you to do that. °? Do not skip any dose of medicine. °? Refill your prescriptions before you run out of medicine. You need your medicines every day. °Eating and drinking ° °· Eat heart-healthy foods. Talk with a dietitian to make an eating plan that is right for you. °? Choose foods that contain no trans fat and are low in saturated fat and cholesterol. Healthy choices include fresh or frozen fruits and vegetables, fish, lean meats, legumes, fat-free or low-fat dairy products, and whole-grain or high-fiber foods. °? Limit salt (sodium) if  directed by your health care provider. Sodium restriction may reduce symptoms of heart failure. Ask a dietitian to recommend heart-healthy seasonings. °? Use healthy cooking methods instead of frying. Healthy methods include roasting, grilling, broiling, baking, poaching, steaming, and stir-frying. °· Limit your fluid intake if directed by your health care provider. Fluid restriction may reduce symptoms of heart failure. °Lifestyle °· Stop smoking or using chewing tobacco. Nicotine and tobacco can damage your heart and your blood vessels. Do not use nicotine gum or patches before talking to your health care provider. °· Limit alcohol intake to no more than 1 drink per day for non-pregnant women and 2 drinks per day for men. One drink equals 12 oz of beer, 5 oz of wine, or 1½ oz of hard liquor. °? Drinking more than that is harmful to your heart. Tell your health care provider if you drink alcohol several times a week. °? Talk with your health care provider about whether any level of alcohol use is safe for you. °? If your heart has already been damaged by alcohol or you have severe heart failure, drinking alcohol should be stopped completely. °· Stop use of illegal drugs. °· Lose weight if directed by your health care provider. Weight loss may reduce symptoms of heart failure. °· Do moderate physical activity if directed by your health care provider. People who are elderly and people with severe heart failure should consult with a health care provider for physical activity recommendations. °  Monitor important information °· Weigh yourself every day. Keeping track of your weight daily helps you to notice excess fluid sooner. °? Weigh yourself every morning after you urinate and before you eat breakfast. °? Wear the same amount of clothing each time you weigh yourself. °? Record your daily weight. Provide your health care provider with your weight record. °· Monitor and record your blood pressure as told by your health  care provider. °· Check your pulse as told by your health care provider. °Dealing with extreme temperatures °· If the weather is extremely hot: °? Avoid vigorous physical activity. °? Use air conditioning or fans or seek a cooler location. °? Avoid caffeine and alcohol. °? Wear loose-fitting, lightweight, and light-colored clothing. °· If the weather is extremely cold: °? Avoid vigorous physical activity. °? Layer your clothes. °? Wear mittens or gloves, a hat, and a scarf when you go outside. °? Avoid alcohol. °General instructions °· Manage other health conditions such as hypertension, diabetes, thyroid disease, or abnormal heart rhythms as told by your health care provider. °· Learn to manage stress. If you need help to do this, ask your health care provider. °· Plan rest periods when fatigued. °· Get ongoing education and support as needed. °· Participate in or seek rehabilitation as needed to maintain or improve independence and quality of life. °· Stay up to date with immunizations. Keeping current on pneumococcal and influenza immunizations is especially important to prevent respiratory infections. °· Keep all follow-up visits as told by your health care provider. This is important. °Contact a health care provider if: °· You have a rapid weight gain. °· You have increasing shortness of breath that is unusual for you. °· You are unable to participate in your usual physical activities. °· You tire easily. °· You cough more than normal, especially with physical activity. °· You have any swelling or more swelling in areas such as your hands, feet, ankles, or abdomen. °· You are unable to sleep because it is hard to breathe. °· You feel like your heart is beating quickly (palpitations). °· You become dizzy or light-headed when you stand up. °Get help right away if: °· You have difficulty breathing. °· You notice or your family notices a change in your awareness, such as having trouble staying awake or having  difficulty with concentration. °· You have pain or discomfort in your chest. °· You have an episode of fainting (syncope). °This information is not intended to replace advice given to you by your health care provider. Make sure you discuss any questions you have with your health care provider. °Document Released: 03/10/2005 Document Revised: 11/13/2015 Document Reviewed: 10/03/2015 °[employer] Interactive Patient Education © 2017 [employer] ° °

## 2016-02-23 NOTE — Discharge Summary (Signed)
Pt got discharged to home, discharge instructions provided and patient showed understanding to it, IV taken out,Telemonitor DC,pt left unit in wheelchair with all of the belongings accompanied with a family member (daughter) 

## 2016-02-23 NOTE — Discharge Summary (Signed)
Physician Discharge Summary  Theresa Cain [id number] DOB: 13-Jan-1927  PCP: Kendrick Ranch, MD  Admit date: 02/22/2016 Discharge date: 02/23/2016  Recommendations for Outpatient Follow-up:  1. Dr. Cristie Hem, PCP in 3 days with repeat labs (CBC & BMP). 2. Dr. Constance Haw, EP Cardiology  Home Health: None Equipment/Devices: None    Discharge Condition: Improved and stable.  CODE STATUS: Full  Diet recommendation: Heart healthy diet.  Discharge Diagnoses:  Active Problems:   Acute on chronic diastolic CHF (congestive heart failure) (HCC)   Brief/Interim Summary: 82 year old female with PMH of atrial fibrillation, carotid artery disease, hypertension, CVA, peripheral vascular disease, aortic valve stenosis, being evaluated by EP cardiology for bradycardia, appears to have had an URTI for which she was placed on levofloxacin, had poor appetite and felt that continuing her diuretics would make her more dehydrated and hence voluntarily stopped taking torsemide for 4-5 days and presented to [facility] ED with 3-4 days of progressive dyspnea. She was admitted for decompensated CHF.  Assessment and plan:  1. Acute on chronic diastolic CHF: Precipitated by missing diuretics in the context of URTI. Patient follows with primary Cardiologist, Dr. Clayborn Bigness at the [facility] clinic. As per EP cardiologist's note 02/06/16, TTE 01/10/16 showed LVEF >55%. POC troponin 1 negative. BNP 390. EKG showed A. fib with controlled ventricular rate 76/m, normal axis, Q waves in V1-2 and QTC 450 ms. She received 2 doses of IV Lasix 40 mg with good effect. -2.4 L since admission. Dyspnea resolved and not hypoxic with ambulation on room air. Patient was counseled to resume her home dose of torsemide from tomorrow and regarding compliance with medications. She verbalized understanding.  2. Hypokalemia: Secondary to diuresis. Replaced prior to discharge. 3. Paroxysmal A. fib: Controlled  ventricular rate without bradyarrhythmias. Not on beta blockers. Continue Eliquis. CHA2DS2-VASc Score : 5  4. History of bradycardia: Being evaluated by outpatient EP cardiology. They were waiting on most recent event monitor results and plan follow-up. 5. Essential hypertension: Mildly uncontrolled at times. Continue home regimen of amlodipine. 6. History of CVA: Patient has been on low-dose aspirin and Eliquis prior to admission. Follow-up with PCP or her cardiologist to determine if aspirin can be discontinued due to increased bleeding risk. 7. Anemia: Hemoglobin 9. No prior results to compare. May be chronic. Follow with PCP with repeat CBC.   Discharge Instructions  Discharge Instructions    (HEART FAILURE PATIENTS) Call MD:  Anytime you have any of the following symptoms: 1) 3 pound weight gain in 24 hours or 5 pounds in 1 week 2) shortness of breath, with or without a dry hacking cough 3) swelling in the hands, feet or stomach 4) if you have to sleep on extra pillows at night in order to breathe.    Complete by:  As directed    Call MD for:  extreme fatigue    Complete by:  As directed    Call MD for:  persistant dizziness or light-headedness    Complete by:  As directed    Call MD for:  severe uncontrolled pain    Complete by:  As directed    Diet - low sodium heart healthy    Complete by:  As directed    Increase activity slowly    Complete by:  As directed        Medication List    TAKE these medications   acetaminophen 500 MG tablet Commonly known as:  TYLENOL Take 1,000 mg by mouth every 6 (six)  hours as needed for moderate pain.   amLODipine 5 MG tablet Commonly known as:  NORVASC Take 5 mg by mouth daily.   aspirin EC 81 MG tablet Take 81 mg by mouth daily.   ELIQUIS 5 MG Tabs tablet Generic drug:  apixaban Take 5 mg by mouth 2 (two) times daily.   escitalopram 10 MG tablet Commonly known as:  LEXAPRO Take 10 mg by mouth at bedtime.   gabapentin 600 MG  tablet Commonly known as:  NEURONTIN Take 1,200 mg by mouth 2 (two) times daily.   levofloxacin 500 MG tablet Commonly known as:  LEVAQUIN Take 500 mg by mouth daily.   multivitamin with minerals Tabs tablet Take 1 tablet by mouth daily.   NYQUIL COLD & FLU PO Take 10 mLs by mouth at bedtime as needed (COLD).   omeprazole 20 MG capsule Commonly known as:  PRILOSEC Take 20 mg by mouth daily.   oxybutynin 10 MG 24 hr tablet Commonly known as:  DITROPAN-XL Take 10 mg by mouth at bedtime.   pravastatin 40 MG tablet Commonly known as:  PRAVACHOL Take 40 mg by mouth every evening.   torsemide 20 MG tablet Commonly known as:  DEMADEX Take 20 mg by mouth daily.   Vitamin D (Ergocalciferol) 50000 units Caps capsule Commonly known as:  DRISDOL Take 50,000 Units by mouth 2 (two) times a week.      Follow-up Information    Kendrick Ranch, MD. Schedule an appointment as soon as possible for a visit in 3 day(s).   Specialty:  Internal Medicine Why:  To be seen with repeat labs (CBC & BMP). Contact information: [place] [phone]        Will Meredith Leeds, MD. Schedule an appointment as soon as possible for a visit.   Specialty:  Cardiology Contact information: [address] [place] [address] [phone]          Allergies  Allergen Reactions  . Penicillins Hives  . Sulfa Antibiotics Hives    Consultations:  None   Procedures/Studies: Dg Chest 2 View  Result Date: 02/22/2016 CLINICAL DATA:  Cough for 8 days EXAM: CHEST  2 VIEW COMPARISON:  None. FINDINGS: There is interstitial pulmonary edema. There is no airspace consolidation. There is cardiomegaly with pulmonary venous hypertension. There is atherosclerotic calcification in the aorta. No adenopathy. There is degenerative change in the thoracic spine. There is postoperative change in the right neck region. IMPRESSION: There is evidence of a degree  of congestive heart failure. There is aortic atherosclerosis. Electronically Signed   By: Lowella Grip III M.D.   On: 02/22/2016 15:56      Subjective: Denies complaints. Denies dyspnea, current chest pain, cough, dizziness or lightheadedness. He ambulated steadily with RN supervision without complaints and oxygen saturation 92% on room air. Gives history of chronic intermittent fleeting anterior chest pain, unrelated to exertion which she has had for several months and has brought this to the attention of her cardiologist and has not noted any change in its character.  Discharge Exam:  Vitals:   02/22/16 2245 02/22/16 2324 02/23/16 0449 02/23/16 1200  BP: 165/87 (!) 163/66 (!) 154/79 (!) 118/59  Pulse: 86 85 69 90  Resp: 12 20 20 20   Temp:  98.1 F (36.7 C) 97.3 F (36.3 C) 97.4 F (36.3 C)  TempSrc:  Oral Oral Oral  SpO2: 98% 95% 95% 95%  Weight:  96.7 kg (213 lb 1.6 oz) 95.8  kg (211 lb 1.6 oz)   Height:  5\' 11"  (1.803 m)      General:Pleasant elderly female seen ambulating steadily uncomfortably in the hall with RN supervision without distress.  Cardiovascular: S1 & S2 heard,RRR, S1/S2 +. No murmurs, rubs, gallops or clicks. No JVD. Trace bilateral ankle edema. Telemetry: A. fib with controlled ventricular rate. Respiratory: Clear to auscultation without wheezing, rhonchi or crackles. No increased work of breathing. Abdominal:  Non distended, non tender & soft. No organomegaly or masses appreciated. Normal bowel sounds heard. CNS: Alert and oriented. No focal deficits. Extremities: no edema, no cyanosis    The results of significant diagnostics from this hospitalization (including imaging, microbiology, ancillary and laboratory) are listed below for reference.     Microbiology: No results found for this or any previous visit (from the past 240 hour(s)).   Labs: BNP (last 3 results)  Recent Labs  02/22/16 1454  BNP [id number]*   Basic Metabolic Panel:  Recent  Labs Lab 02/22/16 1454 02/23/16 0316  NA 139 139  K 3.5 3.1*  CL 104 101  CO2 26 27  GLUCOSE 120* 126*  BUN 7 8  CREATININE 1.00 1.03*  CALCIUM 8.5* 8.2*   Liver Function Tests: No results for input(s): AST, ALT, ALKPHOS, BILITOT, PROT, ALBUMIN in the last 168 hours. No results for input(s): LIPASE, AMYLASE in the last 168 hours. No results for input(s): AMMONIA in the last 168 hours. CBC:  Recent Labs Lab 02/22/16 1454  WBC 6.7  HGB 9.0*  HCT 29.7*  MCV 80.7  PLT 157      Time coordinating discharge: Over 30 minutes  SIGNED:  Vernell Leep, MD, FACP, FHM. [facility] Pager [phone]  If 7PM-7AM, please contact night-coverage [email or web address] Password [email or web address] 02/23/2016, 1:42 PM

## 2016-02-25 NOTE — Telephone Encounter (Signed)
Reviewed w/ Camnitz.   lmtcb to arrange appt to discuss possible PPM.

## 2016-02-26 NOTE — Telephone Encounter (Signed)
Pt's dtr returns my call.  Explains mom was just in the hospital for acute on chronic chf.  We discussed monitor and pt symptoms.  They are scheduled to follow up next Monday with Camnitz to discuss possible PPM implant for bradycarida. She thanks me for helping.

## 2016-03-03 ENCOUNTER — Other Ambulatory Visit: Payer: Self-pay | Admitting: Cardiology

## 2016-03-03 ENCOUNTER — Ambulatory Visit (INDEPENDENT_AMBULATORY_CARE_PROVIDER_SITE_OTHER): Payer: Medicare Other | Admitting: Cardiology

## 2016-03-03 ENCOUNTER — Encounter: Payer: Self-pay | Admitting: Cardiology

## 2016-03-03 ENCOUNTER — Encounter: Payer: Self-pay | Admitting: *Deleted

## 2016-03-03 VITALS — BP 152/74 | HR 62 | Ht 72.0 in | Wt 210.8 lb

## 2016-03-03 DIAGNOSIS — I495 Sick sinus syndrome: Secondary | ICD-10-CM

## 2016-03-03 DIAGNOSIS — Z01812 Encounter for preprocedural laboratory examination: Secondary | ICD-10-CM | POA: Diagnosis not present

## 2016-03-03 DIAGNOSIS — I4819 Other persistent atrial fibrillation: Secondary | ICD-10-CM

## 2016-03-03 DIAGNOSIS — I481 Persistent atrial fibrillation: Secondary | ICD-10-CM

## 2016-03-03 LAB — CBC WITH DIFFERENTIAL/PLATELET
BASOS ABS: 0 {cells}/uL (ref 0–200)
BASOS PCT: 0 %
EOS PCT: 3 %
Eosinophils Absolute: 258 cells/uL (ref 15–500)
HCT: 32 % — ABNORMAL LOW (ref 35.0–45.0)
HEMOGLOBIN: 9.5 g/dL — AB (ref 11.7–15.5)
LYMPHS ABS: 1978 {cells}/uL (ref 850–3900)
Lymphocytes Relative: 23 %
MCH: 23.2 pg — AB (ref 27.0–33.0)
MCHC: 29.7 g/dL — ABNORMAL LOW (ref 32.0–36.0)
MCV: 78 fL — ABNORMAL LOW (ref 80.0–100.0)
Monocytes Absolute: 516 cells/uL (ref 200–950)
Monocytes Relative: 6 %
NEUTROS ABS: 5848 {cells}/uL (ref 1500–7800)
Neutrophils Relative %: 68 %
PLATELETS: 255 10*3/uL (ref 140–400)
RBC: 4.1 MIL/uL (ref 3.80–5.10)
RDW: 16.8 % — ABNORMAL HIGH (ref 11.0–15.0)
WBC: 8.6 10*3/uL (ref 3.8–10.8)

## 2016-03-03 LAB — BASIC METABOLIC PANEL
BUN: 16 mg/dL (ref 7–25)
CHLORIDE: 100 mmol/L (ref 98–110)
CO2: 31 mmol/L (ref 20–31)
CREATININE: 1.62 mg/dL — AB (ref 0.60–0.88)
Calcium: 8.6 mg/dL (ref 8.6–10.4)
Glucose, Bld: 115 mg/dL — ABNORMAL HIGH (ref 65–99)
Potassium: 4.5 mmol/L (ref 3.5–5.3)
Sodium: 138 mmol/L (ref 135–146)

## 2016-03-03 NOTE — Addendum Note (Signed)
Addended by: Stanton Kidney on: 03/03/2016 05:27 PM   Modules accepted: Orders

## 2016-03-03 NOTE — Progress Notes (Signed)
Electrophysiology Office Note   Date:  03/03/2016   ID:  Theresa Cain, DOB 1926/09/25, MRN [id number]  PCP:  Kendrick Ranch, MD  Cardiologist:  [facility] Primary Electrophysiologist:  Tristin Gladman Meredith Leeds, MD    Chief Complaint  Patient presents with  . Follow-up    PAF     History of Present Illness: Theresa Cain is a 82 y.o. female who presents today for electrophysiology evaluation.   History of atrial fibrillation, carotid artery disease, hypertension, CVA, peripheral vascular disease. Presented to her primary cardiologist due to lightheadedness, vertigo, and she'll be headed feeling. She says that she was diagnosed with atrial fibrillation. She was put on Eliquis at that time. She was admitted to the hospital with acute on chronic diastolic heart failure. This was precipitated by missing her diuretics in the context of a URTI. She received Lasix and was out 2.4 L since admission. She was in atrial fibrillation during the hospital stay with controlled ventricular rate and no bradycardia arrhythmias.   Today, she denies symptoms of palpitations, chest pain, orthopnea, PND, lower extremity edema, claudication, dizziness, presyncope, syncope, bleeding, or neurologic sequela. The patient is tolerating medications without difficulties and is otherwise without complaint today. She does say that her heart rate goes fast multiple times during the day which makes her feel weak and fatigued. She wore a heart monitor with heart rates down into the 30s to 40s.    Past Medical History:  Diagnosis Date  . A-fib ([place])   . Angina at rest [facility])   . Anxiety   . Aortic valve stenosis   . Bilateral edema of lower extremity   . Bradycardia   . Carotid artery occlusion   . Hypertension   . Obesity   . PVD (peripheral vascular disease) ([place])   . SOB (shortness of breath)   . Vertigo    Past Surgical History:  Procedure Laterality Date  . CARDIOVASCULAR STRESS TEST    . CAROTID  ENDARTERECTOMY    . US ECHOCARDIOGRAPHY       Current Outpatient Prescriptions  Medication Sig Dispense Refill  . acetaminophen (TYLENOL) 500 MG tablet Take 1,000 mg by mouth every 6 (six) hours as needed for moderate pain.    Marland Kitchen amLODipine (NORVASC) 5 MG tablet Take 5 mg by mouth daily.    Marland Kitchen apixaban (ELIQUIS) 5 MG TABS tablet Take 5 mg by mouth 2 (two) times daily.    Marland Kitchen aspirin EC 81 MG tablet Take 81 mg by mouth daily.    Marland Kitchen DM-Doxylamine-Acetaminophen (NYQUIL COLD & FLU PO) Take 10 mLs by mouth at bedtime as needed (COLD).    Marland Kitchen escitalopram (LEXAPRO) 10 MG tablet Take 10 mg by mouth at bedtime.     . gabapentin (NEURONTIN) 600 MG tablet Take 1,200 mg by mouth 2 (two) times daily.     Marland Kitchen levofloxacin (LEVAQUIN) 500 MG tablet Take 500 mg by mouth daily.    . Multiple Vitamin (MULTIVITAMIN WITH MINERALS) TABS tablet Take 1 tablet by mouth daily.    Marland Kitchen omeprazole (PRILOSEC) 20 MG capsule Take 20 mg by mouth daily.    Marland Kitchen oxybutynin (DITROPAN-XL) 10 MG 24 hr tablet Take 10 mg by mouth at bedtime.    . pravastatin (PRAVACHOL) 40 MG tablet Take 40 mg by mouth every evening.     . torsemide (DEMADEX) 20 MG tablet Take 20 mg by mouth daily.    . Vitamin D, Ergocalciferol, (DRISDOL) 50000 units CAPS capsule Take 50,000 Units by mouth 2 (  two) times a week.     No current facility-administered medications for this visit.     Allergies:   Penicillins and Sulfa antibiotics   Social History:  The patient  reports that she has never smoked. She has never used smokeless tobacco. She reports that she does not drink alcohol or use drugs.   Family History:  The patient's family history includes Heart disease in her sister.    ROS:  Please see the history of present illness.   Otherwise, review of systems is positive for Waking up short of breath, chest pressure, cough, dyspnea on exertion, dizziness.   All other systems are reviewed and negative.    PHYSICAL EXAM: VS:  BP (!) 152/74   Pulse 62   Ht  6' (1.829 m)   Wt 210 lb 12.8 oz (95.6 kg)   LMP  (Exact Date)   BMI 28.59 kg/m  , BMI Body mass index is 28.59 kg/m. GEN: Well nourished, well developed, in no acute distress  HEENT: normal  Neck: no JVD, carotid bruits, or masses Cardiac: iRRR; 2/6 systolic murmur at LUSB, rubs, or gallops,no edema  Respiratory:  clear to auscultation bilaterally, normal work of breathing GI: soft, nontender, nondistended, + BS MS: no deformity or atrophy  Skin: warm and dry Neuro:  Strength and sensation are intact Psych: euthymic mood, full affect  EKG:  EKG is ordered today. Personal review of the ekg ordered 02/23/16 shows atrial fibrillation, rate 86, septal infarct  Recent Labs: 02/22/2016: B Natriuretic Peptide 390.0; Hemoglobin 9.0; Platelets 157 02/23/2016: BUN 8; Creatinine, Ser 1.03; Potassium 3.1; Sodium 139    Lipid Panel  No results found for: CHOL, TRIG, HDL, CHOLHDL, VLDL, LDLCALC, LDLDIRECT   Wt Readings from Last 3 Encounters:  03/03/16 210 lb 12.8 oz (95.6 kg)  02/23/16 211 lb 1.6 oz (95.8 kg)  02/06/16 212 lb (96.2 kg)      Other studies Reviewed: Additional studies/ records that were reviewed today include: 12/31/15 TTE Review of the above records today demonstrates:  LVEF>55% (see care everywhere)   ASSESSMENT AND PLAN:  1.  Paroxysmal atrial fibrillation: Currently anticoagulated with Eliquis. She is rate controlled on no rate controlling medicines at the time. We'll continue her on her current medical management. We did discuss the option of returning to sinus rhythm. We discussed medical management as well as cardioversion. We Chesni Vos await the results of the event monitor to determine the best course of action.  This patients CHA2DS2-VASc Score and unadjusted Ischemic Stroke Rate (% per year) is equal to 7.2 % stroke rate/year from a score of 5  Above score calculated as 1 point each if present [CHF, HTN, DM, Vascular=MI/PAD/Aortic Plaque, Age if 65-74, or  Female] Above score calculated as 2 points each if present [Age > 75, or Stroke/TIA/TE]   2. Bradycardia: She has had episodes of bradycardia during waking hours, as well as tachycardia with her atrial fibrillation. I did discuss with her the option of pacemaker placement for further control of her atrial fibrillation. We discussed risks and benefits of pacemaker placement. Risks include bleeding, tamponade, infection, and pneumothorax. She understands these risks and has agreed to the procedure.    Current medicines are reviewed at length with the patient today.   The patient does not have concerns regarding her medicines.  The following changes were made today:  none  Labs/ tests ordered today include:  No orders of the defined types were placed in this encounter.  Disposition:   FU with Raha Tennison 3 months  Signed, Seann Genther Meredith Leeds, MD  03/03/2016 4:18 PM     [address] [place] [id number] [phone] (office) [phone] (fax)

## 2016-03-03 NOTE — Patient Instructions (Signed)
Medication Instructions:  Your physician recommends that you continue on your current medications as directed. Please refer to the Current Medication list given to you today.  Labwork: Pre procedure labs today: BMET/CBD w/ diff  Testing/Procedures: Your physician has recommended that you have a pacemaker inserted. A pacemaker is a small device that is placed under the skin of your chest or abdomen to help control abnormal heart rhythms. This device uses electrical pulses to prompt the heart to beat at a normal rate. Pacemakers are used to treat heart rhythms that are too slow. Wire (leads) are attached to the pacemaker that goes into the chambers of you heart. This is done in the hospital and usually requires and overnight stay. Please see the instruction sheet given to you today for more information.  Follow-Up: Your physician recommends that you schedule a follow-up appointment in: 10-14 days, after your procedure on 03/04/16, with device clinic for a wound check.  Your physician recommends that you schedule a follow-up appointment in: 3 months, after your procedure on 03/04/16, with Dr. Curt Bears.  If you need a refill on your cardiac medications before your next appointment, please call your pharmacy.  Thank you for choosing [facility]!!   Trinidad Curet, RN [phone]  Any Other Special Instructions Will Be Listed Below (If Applicable).  Pacemaker Implantation, Adult Pacemaker implantation is a procedure to place a pacemaker inside your chest. A pacemaker is a small computer that sends electrical signals to the heart and helps your heart beat normally. A pacemaker also stores information about your heart rhythms. You may need pacemaker implantation if you:  Have a slow heartbeat (bradycardia).  Faint (syncope).  Have shortness of breath (dyspnea) due to heart problems. The pacemaker attaches to your heart through a wire, called a lead. Sometimes just one lead is needed. Other  times, there will be two leads. There are two types of pacemakers:  Transvenous pacemaker. This type is placed under the skin or muscle of your chest. The lead goes through a vein in the chest area to reach the inside of the heart.  Epicardial pacemaker. This type is placed under the skin or muscle of your chest or belly. The lead goes through your chest to the outside of the heart. Tell a health care provider about:  Any allergies you have.  All medicines you are taking, including vitamins, herbs, eye drops, creams, and over-the-counter medicines.  Any problems you or family members have had with anesthetic medicines.  Any blood or bone disorders you have.  Any surgeries you have had.  Any medical conditions you have.  Whether you are pregnant or may be pregnant. What are the risks? Generally, this is a safe procedure. However, problems may occur, including:  Infection.  Bleeding.  Failure of the pacemaker or the lead.  Collapse of a lung or bleeding into a lung.  Blood clot inside a blood vessel with a lead.  Damage to the heart.  Infection inside the heart (endocarditis).  Allergic reactions to medicines. What happens before the procedure? Staying hydrated  Follow instructions from your health care provider about hydration, which may include:  Up to 2 hours before the procedure - you may continue to drink clear liquids, such as water, clear fruit juice, black coffee, and plain tea. Eating and drinking restrictions  Follow instructions from your health care provider about eating and drinking, which may include:  8 hours before the procedure - stop eating heavy meals or foods such as meat,  fried foods, or fatty foods.  6 hours before the procedure - stop eating light meals or foods, such as toast or cereal.  6 hours before the procedure - stop drinking milk or drinks that contain milk.  2 hours before the procedure - stop drinking clear liquids. Medicines  Ask  your health care provider about:  Changing or stopping your regular medicines. This is especially important if you are taking diabetes medicines or blood thinners.  Taking medicines such as aspirin and ibuprofen. These medicines can thin your blood. Do not take these medicines before your procedure if your health care provider instructs you not to.  You may be given antibiotic medicine to help prevent infection. General instructions  You will have a heart evaluation. This may include an electrocardiogram (ECG), chest X-ray, and heart imaging (echocardiogram,  or echo) tests.  You will have blood tests.  Do not use any products that contain nicotine or tobacco, such as cigarettes and e-cigarettes. If you need help quitting, ask your health care provider.  Plan to have someone take you home from the hospital or clinic.  If you will be going home right after the procedure, plan to have someone with you for 24 hours.  Ask your health care provider how your surgical site will be marked or identified. What happens during the procedure?  To reduce your risk of infection:  Your health care team will wash or sanitize their hands.  Your skin will be washed with soap.  Hair may be removed from the surgical area.  An IV tube will be inserted into one of your veins.  You will be given one or more of the following:  A medicine to help you relax (sedative).  A medicine to numb the area (local anesthetic).  A medicine to make you fall asleep (general anesthetic).  If you are getting a transvenous pacemaker:  An incision will be made in your upper chest.  A pocket will be made for the pacemaker. It may be placed under the skin or between layers of muscle.  The lead will be inserted into a blood vessel that returns to the heart.  While X-rays are taken by an imaging machine (fluoroscopy), the lead will be advanced through the vein to the inside of your heart.  The other end of the  lead will be tunneled under the skin and attached to the pacemaker.  If you are getting an epicardial pacemaker:  An incision will be made near your ribs or breastbone (sternum) for the lead.  The lead will be attached to the outside of your heart.  Another incision will be made in your chest or upper belly to create a pocket for the pacemaker.  The free end of the lead will be tunneled under the skin and attached to the pacemaker.  The transvenous or epicardial pacemaker will be tested. Imaging studies may be done to check the lead position.  The incisions will be closed with stitches (sutures), adhesive strips, or skin glue.  Bandages (dressing) will be placed over the incisions. The procedure may vary among health care providers and hospitals. What happens after the procedure?  Your blood pressure, heart rate, breathing rate, and blood oxygen level will be monitored until the medicines you were given have worn off.  You will be given antibiotics and pain medicine.  ECG and chest x-rays will be done.  You will wear a continuous type of ECG (Holter monitor) to check your heart rhythm.  Your  health care provider willprogram the pacemaker.  Do not drive for 24 hours if you received a sedative. This information is not intended to replace advice given to you by your health care provider. Make sure you discuss any questions you have with your health care provider. Document Released: 02/28/2002 Document Revised: 09/28/2015 Document Reviewed: 08/22/2015 Elsevier Interactive Patient Education  2017 [employer].

## 2016-03-04 ENCOUNTER — Ambulatory Visit (HOSPITAL_COMMUNITY)
Admission: RE | Admit: 2016-03-04 | Discharge: 2016-03-06 | Disposition: A | Discharge status: Home / Self Care | Payer: Medicare Other | Source: Ambulatory Visit | Attending: Cardiology | Admitting: Cardiology

## 2016-03-04 ENCOUNTER — Encounter (HOSPITAL_COMMUNITY)
Admission: RE | Disposition: A | Discharge status: Home / Self Care | Payer: Self-pay | Source: Ambulatory Visit | Attending: Cardiology

## 2016-03-04 ENCOUNTER — Encounter (HOSPITAL_COMMUNITY): Payer: Self-pay | Admitting: General Practice

## 2016-03-04 DIAGNOSIS — Z79899 Other long term (current) drug therapy: Secondary | ICD-10-CM | POA: Insufficient documentation

## 2016-03-04 DIAGNOSIS — I700 Atherosclerosis of aorta: Secondary | ICD-10-CM | POA: Insufficient documentation

## 2016-03-04 DIAGNOSIS — Z880 Allergy status to penicillin: Secondary | ICD-10-CM | POA: Insufficient documentation

## 2016-03-04 DIAGNOSIS — I480 Paroxysmal atrial fibrillation: Secondary | ICD-10-CM | POA: Diagnosis not present

## 2016-03-04 DIAGNOSIS — T82110A Breakdown (mechanical) of cardiac electrode, initial encounter: Secondary | ICD-10-CM | POA: Diagnosis present

## 2016-03-04 DIAGNOSIS — I6529 Occlusion and stenosis of unspecified carotid artery: Secondary | ICD-10-CM | POA: Insufficient documentation

## 2016-03-04 DIAGNOSIS — Z959 Presence of cardiac and vascular implant and graft, unspecified: Secondary | ICD-10-CM

## 2016-03-04 DIAGNOSIS — I272 Pulmonary hypertension, unspecified: Secondary | ICD-10-CM | POA: Diagnosis not present

## 2016-03-04 DIAGNOSIS — I119 Hypertensive heart disease without heart failure: Secondary | ICD-10-CM | POA: Insufficient documentation

## 2016-03-04 DIAGNOSIS — Z7901 Long term (current) use of anticoagulants: Secondary | ICD-10-CM | POA: Diagnosis not present

## 2016-03-04 DIAGNOSIS — T82120A Displacement of cardiac electrode, initial encounter: Secondary | ICD-10-CM | POA: Insufficient documentation

## 2016-03-04 DIAGNOSIS — Y713 Surgical instruments, materials and cardiovascular devices (including sutures) associated with adverse incidents: Secondary | ICD-10-CM | POA: Diagnosis not present

## 2016-03-04 DIAGNOSIS — I495 Sick sinus syndrome: Secondary | ICD-10-CM | POA: Diagnosis present

## 2016-03-04 HISTORY — DX: Malignant neoplasm of stomach, unspecified: C16.9

## 2016-03-04 HISTORY — DX: Unspecified osteoarthritis, unspecified site: M19.90

## 2016-03-04 HISTORY — PX: EP IMPLANTABLE DEVICE: SHX172B

## 2016-03-04 HISTORY — DX: Gastro-esophageal reflux disease without esophagitis: K21.9

## 2016-03-04 HISTORY — DX: Presence of cardiac pacemaker: Z95.0

## 2016-03-04 LAB — SURGICAL PCR SCREEN
MRSA, PCR: NEGATIVE
STAPHYLOCOCCUS AUREUS: POSITIVE — AB

## 2016-03-04 SURGERY — PACEMAKER IMPLANT

## 2016-03-04 MED ORDER — LIDOCAINE HCL (PF) 1 % IJ SOLN
INTRAMUSCULAR | Status: AC
  Filled 2016-03-04: qty 60

## 2016-03-04 MED ORDER — FENTANYL CITRATE (PF) 100 MCG/2ML IJ SOLN
INTRAMUSCULAR | Status: AC
  Filled 2016-03-04: qty 2

## 2016-03-04 MED ORDER — AMLODIPINE BESYLATE 5 MG PO TABS
5.0000 mg | ORAL_TABLET | Freq: Every day | ORAL | Status: DC
  Administered 2016-03-05 – 2016-03-06 (×2): 5 mg via ORAL
  Filled 2016-03-04 (×2): qty 1

## 2016-03-04 MED ORDER — VANCOMYCIN HCL IN DEXTROSE 1-5 GM/200ML-% IV SOLN
1000.0000 mg | INTRAVENOUS | Status: AC
  Administered 2016-03-04: 1000 mg via INTRAVENOUS

## 2016-03-04 MED ORDER — MIDAZOLAM HCL 5 MG/5ML IJ SOLN
INTRAMUSCULAR | Status: DC | PRN
  Administered 2016-03-04: 1 mg via INTRAVENOUS

## 2016-03-04 MED ORDER — FENTANYL CITRATE (PF) 100 MCG/2ML IJ SOLN
INTRAMUSCULAR | Status: DC | PRN
  Administered 2016-03-04: 25 ug via INTRAVENOUS

## 2016-03-04 MED ORDER — LIDOCAINE HCL (PF) 1 % IJ SOLN
INTRAMUSCULAR | Status: DC | PRN
  Administered 2016-03-04: 40 mL

## 2016-03-04 MED ORDER — GABAPENTIN 400 MG PO CAPS
1200.0000 mg | ORAL_CAPSULE | Freq: Two times a day (BID) | ORAL | Status: DC
  Administered 2016-03-04 – 2016-03-06 (×4): 1200 mg via ORAL
  Filled 2016-03-04 (×4): qty 3

## 2016-03-04 MED ORDER — HEPARIN (PORCINE) IN NACL 2-0.9 UNIT/ML-% IJ SOLN
INTRAMUSCULAR | Status: DC | PRN
  Administered 2016-03-04: 17:00:00

## 2016-03-04 MED ORDER — ACETAMINOPHEN 325 MG PO TABS: 325.0000 mg | ORAL_TABLET | ORAL | Status: DC | PRN

## 2016-03-04 MED ORDER — CHLORHEXIDINE GLUCONATE CLOTH 2 % EX PADS
6.0000 | MEDICATED_PAD | Freq: Every day | CUTANEOUS | Status: DC
  Administered 2016-03-04: 22:00:00 6 via TOPICAL

## 2016-03-04 MED ORDER — METOPROLOL TARTRATE 25 MG PO TABS
25.0000 mg | ORAL_TABLET | Freq: Two times a day (BID) | ORAL | Status: DC
  Administered 2016-03-04 – 2016-03-06 (×4): 25 mg via ORAL
  Filled 2016-03-04 (×4): qty 1

## 2016-03-04 MED ORDER — OXYBUTYNIN CHLORIDE ER 10 MG PO TB24
10.0000 mg | ORAL_TABLET | Freq: Every day | ORAL | Status: DC
  Administered 2016-03-04 – 2016-03-05 (×2): 10 mg via ORAL
  Filled 2016-03-04 (×2): qty 1

## 2016-03-04 MED ORDER — ADULT MULTIVITAMIN W/MINERALS CH
1.0000 | ORAL_TABLET | Freq: Every day | ORAL | Status: DC
  Administered 2016-03-05 – 2016-03-06 (×2): 1 via ORAL
  Filled 2016-03-04 (×2): qty 1

## 2016-03-04 MED ORDER — APIXABAN 5 MG PO TABS
5.0000 mg | ORAL_TABLET | Freq: Two times a day (BID) | ORAL | Status: DC
  Administered 2016-03-04: 22:00:00 5 mg via ORAL
  Filled 2016-03-04: qty 1

## 2016-03-04 MED ORDER — SODIUM CHLORIDE 0.9 % IR SOLN
80.0000 mg | Status: AC
  Administered 2016-03-04: 80 mg

## 2016-03-04 MED ORDER — LEVOFLOXACIN 500 MG PO TABS
500.0000 mg | ORAL_TABLET | Freq: Every day | ORAL | Status: DC
  Filled 2016-03-04 (×2): qty 1

## 2016-03-04 MED ORDER — VITAMIN D (ERGOCALCIFEROL) 1.25 MG (50000 UNIT) PO CAPS
50000.0000 [IU] | ORAL_CAPSULE | ORAL | Status: DC
  Filled 2016-03-04: qty 1

## 2016-03-04 MED ORDER — HEPARIN (PORCINE) IN NACL 2-0.9 UNIT/ML-% IJ SOLN
INTRAMUSCULAR | Status: AC
  Filled 2016-03-04: qty 1000

## 2016-03-04 MED ORDER — SODIUM CHLORIDE 0.9 % IR SOLN
Status: AC
  Filled 2016-03-04: qty 2

## 2016-03-04 MED ORDER — VANCOMYCIN HCL IN DEXTROSE 1-5 GM/200ML-% IV SOLN
INTRAVENOUS | Status: AC
  Filled 2016-03-04: qty 200

## 2016-03-04 MED ORDER — ACETAMINOPHEN 500 MG PO TABS: 1000.0000 mg | ORAL_TABLET | Freq: Four times a day (QID) | ORAL | Status: DC | PRN

## 2016-03-04 MED ORDER — PANTOPRAZOLE SODIUM 40 MG PO TBEC
40.0000 mg | DELAYED_RELEASE_TABLET | Freq: Every day | ORAL | Status: DC
  Administered 2016-03-05 – 2016-03-06 (×2): 40 mg via ORAL
  Filled 2016-03-04 (×2): qty 1

## 2016-03-04 MED ORDER — ESCITALOPRAM OXALATE 10 MG PO TABS
10.0000 mg | ORAL_TABLET | Freq: Every day | ORAL | Status: DC
  Administered 2016-03-04 – 2016-03-05 (×2): 10 mg via ORAL
  Filled 2016-03-04 (×2): qty 1

## 2016-03-04 MED ORDER — ASPIRIN EC 81 MG PO TBEC
81.0000 mg | DELAYED_RELEASE_TABLET | Freq: Every day | ORAL | Status: DC
  Filled 2016-03-04: qty 1

## 2016-03-04 MED ORDER — ONDANSETRON HCL 4 MG/2ML IJ SOLN: 4.0000 mg | Freq: Four times a day (QID) | INTRAMUSCULAR | Status: DC | PRN

## 2016-03-04 MED ORDER — MIDAZOLAM HCL 5 MG/5ML IJ SOLN
INTRAMUSCULAR | Status: AC
  Filled 2016-03-04: qty 5

## 2016-03-04 MED ORDER — VANCOMYCIN HCL IN DEXTROSE 1-5 GM/200ML-% IV SOLN
1000.0000 mg | Freq: Two times a day (BID) | INTRAVENOUS | Status: AC
  Administered 2016-03-05: 1000 mg via INTRAVENOUS
  Filled 2016-03-04: qty 200

## 2016-03-04 MED ORDER — GABAPENTIN 600 MG PO TABS: 1200.0000 mg | ORAL_TABLET | Freq: Two times a day (BID) | ORAL | Status: DC

## 2016-03-04 MED ORDER — MUPIROCIN 2 % EX OINT
TOPICAL_OINTMENT | CUTANEOUS | Status: AC
  Administered 2016-03-04: 1 via TOPICAL
  Filled 2016-03-04: qty 22

## 2016-03-04 MED ORDER — YOU HAVE A PACEMAKER BOOK
Freq: Once | Status: AC
  Administered 2016-03-04: 21:00:00
  Filled 2016-03-04: qty 1

## 2016-03-04 MED ORDER — MUPIROCIN 2 % EX OINT
1.0000 "application " | TOPICAL_OINTMENT | Freq: Two times a day (BID) | CUTANEOUS | Status: DC
  Administered 2016-03-04 – 2016-03-06 (×4): 1 via NASAL
  Filled 2016-03-04: qty 22

## 2016-03-04 MED ORDER — TORSEMIDE 20 MG PO TABS
20.0000 mg | ORAL_TABLET | Freq: Every day | ORAL | Status: DC
  Administered 2016-03-05: 10:00:00 20 mg via ORAL
  Filled 2016-03-04: qty 1

## 2016-03-04 MED ORDER — SODIUM CHLORIDE 0.9 % IV SOLN
INTRAVENOUS | Status: DC
  Administered 2016-03-04: 13:00:00 via INTRAVENOUS

## 2016-03-04 MED ORDER — PRAVASTATIN SODIUM 40 MG PO TABS
40.0000 mg | ORAL_TABLET | Freq: Every evening | ORAL | Status: DC
  Administered 2016-03-04 – 2016-03-05 (×2): 40 mg via ORAL
  Filled 2016-03-04 (×2): qty 1

## 2016-03-04 MED ORDER — MUPIROCIN 2 % EX OINT
1.0000 "application " | TOPICAL_OINTMENT | Freq: Once | CUTANEOUS | Status: AC
  Administered 2016-03-04: 1 via TOPICAL

## 2016-03-04 SURGICAL SUPPLY — 7 items
CABLE SURGICAL S-101-97-12 (CABLE) ×3 IMPLANT
LEAD CAPSURE NOVUS 5076-52CM (Lead) ×3 IMPLANT
LEAD CAPSURE NOVUS 5076-58CM (Lead) ×3 IMPLANT
PAD DEFIB LIFELINK (PAD) ×3 IMPLANT
PPM ADVISA MRI DR A2DR01 (Pacemaker) ×3 IMPLANT
SHEATH CLASSIC 7F (SHEATH) ×6 IMPLANT
TRAY PACEMAKER INSERTION (PACKS) ×3 IMPLANT

## 2016-03-04 NOTE — H&P (Signed)
Theresa Cain is a 82 y.o. female with a history of atrial fibrillation and tachy-brady syndrome.  She has been having symptomatic tachycardia with issues with bradycardia as well on no medications.  On exam, irregular rhythm, 3/6 systolic murmur at the base, lungs clear. Risks and benefits explained.  Risks include but not limited to bleeding, infection, tamponade, pneumothorax.  The patient understands the risks and has agreed to the procedure.  Will Curt Bears, MD 03/04/2016 3:25 PM

## 2016-03-05 ENCOUNTER — Encounter (HOSPITAL_COMMUNITY): Payer: Self-pay | Admitting: Cardiology

## 2016-03-05 ENCOUNTER — Encounter (HOSPITAL_COMMUNITY)
Admission: RE | Disposition: A | Discharge status: Home / Self Care | Payer: Self-pay | Source: Ambulatory Visit | Attending: Cardiology

## 2016-03-05 ENCOUNTER — Ambulatory Visit (HOSPITAL_COMMUNITY): Payer: Medicare Other

## 2016-03-05 DIAGNOSIS — T82120A Displacement of cardiac electrode, initial encounter: Secondary | ICD-10-CM

## 2016-03-05 DIAGNOSIS — I495 Sick sinus syndrome: Secondary | ICD-10-CM | POA: Diagnosis not present

## 2016-03-05 DIAGNOSIS — T82110A Breakdown (mechanical) of cardiac electrode, initial encounter: Secondary | ICD-10-CM | POA: Diagnosis present

## 2016-03-05 DIAGNOSIS — I272 Pulmonary hypertension, unspecified: Secondary | ICD-10-CM | POA: Diagnosis not present

## 2016-03-05 DIAGNOSIS — I480 Paroxysmal atrial fibrillation: Secondary | ICD-10-CM | POA: Diagnosis not present

## 2016-03-05 HISTORY — PX: EP IMPLANTABLE DEVICE: SHX172B

## 2016-03-05 LAB — BASIC METABOLIC PANEL
ANION GAP: 6 (ref 5–15)
BUN: 10 mg/dL (ref 6–20)
CALCIUM: 8.9 mg/dL (ref 8.9–10.3)
CO2: 28 mmol/L (ref 22–32)
Chloride: 104 mmol/L (ref 101–111)
Creatinine, Ser: 1.05 mg/dL — ABNORMAL HIGH (ref 0.44–1.00)
GFR calc Af Amer: 54 mL/min — ABNORMAL LOW (ref >60)
GFR, EST NON AFRICAN AMERICAN: 46 mL/min — AB (ref >60)
GLUCOSE: 127 mg/dL — AB (ref 65–99)
POTASSIUM: 4.3 mmol/L (ref 3.5–5.1)
SODIUM: 138 mmol/L (ref 135–145)

## 2016-03-05 LAB — GLUCOSE, CAPILLARY: Glucose-Capillary: 96 mg/dL (ref 65–99)

## 2016-03-05 SURGERY — LEAD REVISION/REPAIR
Anesthesia: LOCAL

## 2016-03-05 MED ORDER — VANCOMYCIN HCL IN DEXTROSE 1-5 GM/200ML-% IV SOLN
1000.0000 mg | Freq: Two times a day (BID) | INTRAVENOUS | Status: AC
  Administered 2016-03-06: 1000 mg via INTRAVENOUS
  Filled 2016-03-05: qty 200

## 2016-03-05 MED ORDER — SODIUM CHLORIDE 0.9 % IR SOLN
80.0000 mg | Status: AC
  Administered 2016-03-05: 80 mg

## 2016-03-05 MED ORDER — APIXABAN 2.5 MG PO TABS
2.5000 mg | ORAL_TABLET | Freq: Two times a day (BID) | ORAL | Status: DC
  Administered 2016-03-06: 2.5 mg via ORAL
  Filled 2016-03-05 (×2): qty 1

## 2016-03-05 MED ORDER — ACETAMINOPHEN 325 MG PO TABS: 325.0000 mg | ORAL_TABLET | ORAL | Status: DC | PRN

## 2016-03-05 MED ORDER — MIDAZOLAM HCL 5 MG/5ML IJ SOLN
INTRAMUSCULAR | Status: AC
  Filled 2016-03-05: qty 5

## 2016-03-05 MED ORDER — LIDOCAINE HCL (PF) 1 % IJ SOLN
INTRAMUSCULAR | Status: AC
  Filled 2016-03-05: qty 60

## 2016-03-05 MED ORDER — VANCOMYCIN HCL IN DEXTROSE 1-5 GM/200ML-% IV SOLN
1000.0000 mg | INTRAVENOUS | Status: AC
  Administered 2016-03-05: 1000 mg via INTRAVENOUS

## 2016-03-05 MED ORDER — HEPARIN (PORCINE) IN NACL 2-0.9 UNIT/ML-% IJ SOLN
INTRAMUSCULAR | Status: AC
  Filled 2016-03-05: qty 500

## 2016-03-05 MED ORDER — FENTANYL CITRATE (PF) 100 MCG/2ML IJ SOLN
INTRAMUSCULAR | Status: AC
  Filled 2016-03-05: qty 2

## 2016-03-05 MED ORDER — VANCOMYCIN HCL IN DEXTROSE 1-5 GM/200ML-% IV SOLN
INTRAVENOUS | Status: AC
  Filled 2016-03-05: qty 200

## 2016-03-05 MED ORDER — MIDAZOLAM HCL 5 MG/5ML IJ SOLN
INTRAMUSCULAR | Status: DC | PRN
  Administered 2016-03-05: 1 mg via INTRAVENOUS

## 2016-03-05 MED ORDER — SODIUM CHLORIDE 0.9 % IV SOLN
INTRAVENOUS | Status: AC
  Administered 2016-03-05: 17:00:00 via INTRAVENOUS

## 2016-03-05 MED ORDER — HEPARIN (PORCINE) IN NACL 2-0.9 UNIT/ML-% IJ SOLN
INTRAMUSCULAR | Status: DC | PRN
  Administered 2016-03-05: 16:00:00

## 2016-03-05 MED ORDER — SODIUM CHLORIDE 0.9 % IR SOLN
Status: AC
Start: 2016-03-05 — End: 2016-03-05
  Filled 2016-03-05: qty 2

## 2016-03-05 MED ORDER — SODIUM CHLORIDE 0.9 % IV SOLN: INTRAVENOUS | Status: DC

## 2016-03-05 MED ORDER — LIDOCAINE HCL (PF) 1 % IJ SOLN
INTRAMUSCULAR | Status: DC | PRN
  Administered 2016-03-05: 45 mL via INTRADERMAL

## 2016-03-05 MED ORDER — ONDANSETRON HCL 4 MG/2ML IJ SOLN: 4.0000 mg | Freq: Four times a day (QID) | INTRAMUSCULAR | Status: DC | PRN

## 2016-03-05 MED ORDER — FENTANYL CITRATE (PF) 100 MCG/2ML IJ SOLN
INTRAMUSCULAR | Status: DC | PRN
  Administered 2016-03-05: 25 ug via INTRAVENOUS

## 2016-03-05 SURGICAL SUPPLY — 6 items
CABLE SURGICAL S-101-97-12 (CABLE) ×2 IMPLANT
HEMOSTAT SURGICEL 2X4 FIBR (HEMOSTASIS) ×2 IMPLANT
LEAD CAPSURE NOVUS 5076-52CM (Lead) ×2 IMPLANT
PAD DEFIB LIFELINK (PAD) ×2 IMPLANT
SHEATH CLASSIC 7F (SHEATH) ×2 IMPLANT
TRAY PACEMAKER INSERTION (PACKS) ×2 IMPLANT

## 2016-03-05 NOTE — Progress Notes (Signed)
Received report by phone from radiology, unable to confirm placement of RA lead by chest xray. Texted Chanetta Marshall NP above information.

## 2016-03-05 NOTE — Discharge Instructions (Addendum)

## 2016-03-05 NOTE — Progress Notes (Addendum)
SUBJECTIVE: The patient is doing well today.  At this time, she denies chest pain, shortness of breath, or any new concerns. 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  Marland Kitchen amLODipine  5 mg Oral Daily  . apixaban  5 mg Oral BID  . aspirin EC  81 mg Oral Daily  . Chlorhexidine Gluconate Cloth  6 each Topical Daily  . escitalopram  10 mg Oral QHS  . gabapentin  1,200 mg Oral BID  . levofloxacin  500 mg Oral q1800  . metoprolol tartrate  25 mg Oral BID  . multivitamin with minerals  1 tablet Oral Daily  . mupirocin ointment  1 application Nasal BID  . oxybutynin  10 mg Oral QHS  . pantoprazole  40 mg Oral Daily  . pravastatin  40 mg Oral QPM  . torsemide  20 mg Oral Daily  . [START ON 03/06/2016] Vitamin D (Ergocalciferol)  50,000 Units Oral Once per day on Mon Thu     OBJECTIVE: Physical Exam: Vitals:   03/04/16 2000 03/04/16 2027 03/04/16 2100 03/05/16 0309  BP: (!) 160/62 (!) 160/62 (!) 142/50 (!) 145/54  Pulse: 61 63  60  Resp: (!) 21 18 20  (!) 24  Temp:  97.7 F (36.5 C)  97.9 F (36.6 C)  TempSrc:  Oral  Oral  SpO2: 94% 94% 97% 94%  Weight:    211 lb 10.3 oz (96 kg)  Height:        Intake/Output Summary (Last 24 hours) at 03/05/16 [id number] Last data filed at 03/05/16 0600  Gross per 24 hour  Intake            542.5 ml  Output             1150 ml  Net           -607.5 ml    Telemetry reveals sinus rhythm  GEN- The patient is well appearing, alert and oriented x 3 today.   Head- normocephalic, atraumatic Eyes-  Sclera clear, conjunctiva pink Ears- hearing intact Oropharynx- clear Neck- supple, no JVP Lymph- no cervical lymphadenopathy Lungs- Clear to ausculation bilaterally, normal work of breathing Heart- Regular rate and rhythm, no murmurs, rubs or gallops, PMI not laterally displaced GI- soft, NT, ND, + BS Extremities- no clubbing, cyanosis, or edema Skin- no rash or lesion Psych- euthymic mood, full affect Neuro- strength  and sensation are intact  LABS: Basic Metabolic Panel:  Recent Labs  03/03/16 1700  NA 138  K 4.5  CL 100  CO2 31  GLUCOSE 115*  BUN 16  CREATININE 1.62*  CALCIUM 8.6   Liver Function Tests: No results for input(s): AST, ALT, ALKPHOS, BILITOT, PROT, ALBUMIN in the last 72 hours. No results for input(s): LIPASE, AMYLASE in the last 72 hours. CBC:  Recent Labs  03/03/16 1700  WBC 8.6  NEUTROABS 5,848  HGB 9.5*  HCT 32.0*  MCV 78.0*  PLT 255   Cardiac Enzymes: No results for input(s): CKTOTAL, CKMB, CKMBINDEX, TROPONINI in the last 72 hours. BNP: Invalid input(s): POCBNP D-Dimer: No results for input(s): DDIMER in the last 72 hours. Hemoglobin A1C: No results for input(s): HGBA1C in the last 72 hours. Fasting Lipid Panel: No results for input(s): CHOL, HDL, LDLCALC, TRIG, CHOLHDL, LDLDIRECT in the last 72 hours. Thyroid Function Tests: No results for input(s): TSH, T4TOTAL, T3FREE, THYROIDAB in the last 72 hours.  Invalid input(s): FREET3 Anemia Panel: No results for input(s): VITAMINB12, FOLATE, FERRITIN, TIBC, IRON,  RETICCTPCT in the last 72 hours.  RADIOLOGY: Dg Chest 2 View  Result Date: 03/05/2016 CLINICAL DATA:  Cardiac arrhythmia with pacemaker placement EXAM: CHEST  2 VIEW COMPARISON:  February 22, 2016 FINDINGS: There is a pacemaker lead which clearly extends to the right ventricle. A second pacemaker lead appears somewhat inferior in location with respect to expected location of the right atrium. No pneumothorax. There remains interstitial pulmonary edema with mild cardiomegaly. There appears to be pulmonary venous hypertension as well. There is atherosclerotic calcification aorta. No adenopathy. IMPRESSION: Pacemaker with leads as described. The more posterior lead tip is more inferior in location than is generally seen with right atrial placement. On this study, is not possible to confirm that this lead is lead attached to the right atrium. The more  anterior lead tip is clearly in the right ventricular region. No pneumothorax. Evidence of underlying congestive heart failure persists. There is atherosclerotic calcification in the aorta. These results will be called to the ordering clinician or representative by the Radiologist Assistant, and communication documented in the PACS or zVision Dashboard. Electronically Signed   By: Lowella Grip III M.D.   On: 03/05/2016 07:35   Dg Chest 2 View  Result Date: 02/22/2016 CLINICAL DATA:  Cough for 8 days EXAM: CHEST  2 VIEW COMPARISON:  None. FINDINGS: There is interstitial pulmonary edema. There is no airspace consolidation. There is cardiomegaly with pulmonary venous hypertension. There is atherosclerotic calcification in the aorta. No adenopathy. There is degenerative change in the thoracic spine. There is postoperative change in the right neck region. IMPRESSION: There is evidence of a degree of congestive heart failure. There is aortic atherosclerosis. Electronically Signed   By: Lowella Grip III M.D.   On: 02/22/2016 15:56    ASSESSMENT AND PLAN:  Active Problems:   Tachy-brady syndrome ([place])  Unfortunately, her RA lead has since dislodged.  Will need to be replaced.  Discussed this with her and her daughter.  Risks and benefits of lead revision discussed.  Risks include but not limited to bleeding, infection, tamponade, pneumothorax.  She understands these risks and has agreed to the procedure.  She has had a small amount of breakfast, will plan for later in the day today. Will Meredith Leeds, MD 03/05/2016 8:28 AM   n Reviewed XR personally and reviewed procedure with patient and daughter.  Will hope to use current lead to maintain access Do not see indication for ASA so will stop Cr 1.6 this am so apixoban dose may need adjustment, although that is acute change   Will recheck bmet in am

## 2016-03-05 NOTE — Care Management Note (Signed)
Case Management Note  Patient Details  Name: Theresa Cain MRN: [id number] Date of Birth: 05-14-1926  Subjective/Objective:  S/p pace maker implant, patient has RA lead dislodgement and for replacement today. She lives alone, her son lives right beside her and he checks on her quite often per daughter, also daughter states she will be there with her some as well.  NCM will cont to follow for dc needs.                   Action/Plan:   Expected Discharge Date:                  Expected Discharge Plan:  Home/Self Care  In-House Referral:     Discharge planning Services  CM Consult  Post Acute Care Choice:    Choice offered to:     DME Arranged:    DME Agency:     HH Arranged:    HH Agency:     Status of Service:  Completed, signed off  If discussed at [employer] of Stay Meetings, dates discussed:    Additional Comments:  Zenon Mayo, RN 03/05/2016, 9:03 AM

## 2016-03-06 ENCOUNTER — Ambulatory Visit (HOSPITAL_COMMUNITY): Payer: Medicare Other

## 2016-03-06 ENCOUNTER — Encounter (HOSPITAL_COMMUNITY): Payer: Self-pay | Admitting: Internal Medicine

## 2016-03-06 DIAGNOSIS — I272 Pulmonary hypertension, unspecified: Secondary | ICD-10-CM | POA: Diagnosis not present

## 2016-03-06 DIAGNOSIS — I480 Paroxysmal atrial fibrillation: Secondary | ICD-10-CM | POA: Diagnosis not present

## 2016-03-06 DIAGNOSIS — I495 Sick sinus syndrome: Secondary | ICD-10-CM | POA: Diagnosis not present

## 2016-03-06 DIAGNOSIS — T82120A Displacement of cardiac electrode, initial encounter: Secondary | ICD-10-CM | POA: Diagnosis not present

## 2016-03-06 MED ORDER — METOPROLOL TARTRATE 25 MG PO TABS: 25.0000 mg | ORAL_TABLET | Freq: Two times a day (BID) | ORAL | 3 refills | Status: DC

## 2016-03-06 MED ORDER — MUPIROCIN 2 % EX OINT: 1.0000 "application " | TOPICAL_OINTMENT | Freq: Two times a day (BID) | CUTANEOUS | 0 refills | Status: AC

## 2016-03-06 NOTE — Discharge Summary (Signed)
ELECTROPHYSIOLOGY PROCEDURE DISCHARGE SUMMARY    Patient ID: Theresa Cain,  MRN: [id number], DOB/AGE: 80-Jan-1928 80 y.o.  Admit date: 03/04/2016 Discharge date: 03/06/2016  Primary Care Physician: Kendrick Ranch, MD  Primary Cardiologist: Dr. Clayborn Bigness Electrophysiologist: Dr. Curt Bears  Primary Discharge Diagnosis:  1. Tachy/brady syndrome     status post pacemaker implantation this admission  Secondary Discharge Diagnosis:  1. Paroxysmal AFib     CHA2DS2Vasc is at least 5, on Eliquis 2. PVD (carotid) 3. HTN  Allergies  Allergen Reactions  . Penicillins Hives  . Sulfa Antibiotics Hives     Procedures This Admission:  1.  Implantation of a MDT dual chamber PPM on 03/04/16 by Dr Curt Bears.  The patient received a Medtronic Advisa DR MRI Sure Scan model [id number] (serial number [id number]), Medtronic model [id number] (serial number [id number]) right atrial lead and a Medtronic model 5076 (serial number [id number]) right ventricular leadThere were no immediate post procedure complications. 2. RA lead revision 03/05/16 with Dr. Caryl Comes secondary to RA lead dislodgement 3.  CXR on 03/06/16 demonstrated no pneumothorax status post device implantation.   Brief HPI: Theresa Cain is a 82 y.o. female was referred to electrophysiology in the outpatient setting for consideration of PPM implantation.  Past medical history includes PAF, HTN, PVD.  The patient has had symptomatic bradycardia , tachy/brady syndrome.  Risks, benefits, and alternatives to PPM implantation were reviewed with the patient who wished to proceed.   Hospital Course:  The patient was admitted and underwent implantation of a PPM with details as outlined above.  Unfortunately overnight he RA lead dislodged and required revision 03/05/16 with dr. Caryl Comes.  SHe was monitored on telemetry overnight which demonstrated AFib w/CVR, intermittent V pacing.  Left chest was without hematoma or ecchymosis.  The device was  interrogated and found to be functioning normally.  CXR was obtained and demonstrated no pneumothorax status post device implantation.  Wound care, arm mobility, and restrictions were reviewed with the patient.  Last Creatinine is 1.05, back to her baseline and Kellyn Mansfield resume her home dose of Eliquis 5mg  BID.  The patient was examined by Dr. Curt Bears and considered stable for discharge to home.    Physical Exam: Vitals:   03/05/16 2000 03/05/16 2045 03/06/16 0454 03/06/16 0711  BP:  (!) 120/49 (!) 131/55 (!) 116/94  Pulse:  (!) 50 (!) 56 (!) 55  Resp: 20 17 19  (!) 22  Temp:  97.5 F (36.4 C) 97.7 F (36.5 C) 98.1 F (36.7 C)  TempSrc:  Oral Oral Oral  SpO2:  95% 95% 94%  Weight:   202 lb 2.6 oz (91.7 kg)   Height:        GEN- The patient is well appearing, alert and oriented x 3 today.   HEENT: normocephalic, atraumatic; sclera clear, conjunctiva pink; hearing intact; oropharynx clear; neck supple, no JVP Lungs- Clear to ausculation bilaterally, normal work of breathing.  No wheezes, rales, rhonchi Heart- Regular rate and rhythm, 2/6 SM, no rubs or gallops GI- soft, non-tender, non-distended Extremities- no clubbing, cyanosis, or edema MS- no significant deformity or atrophy Skin- warm and dry, no rash or lesion, left chest without hematoma/ecchymosis Psych- euthymic mood, full affect Neuro- no gross deficits   Labs:   Lab Results  Component Value Date   WBC 8.6 03/03/2016   HGB 9.5 (L) 03/03/2016   HCT 32.0 (L) 03/03/2016   MCV 78.0 (L) 03/03/2016   PLT 255 03/03/2016     Recent  Labs Lab 03/05/16 1050  NA 138  K 4.3  CL 104  CO2 28  BUN 10  CREATININE 1.05*  CALCIUM 8.9  GLUCOSE 127*    Discharge Medications:    Medication List    STOP taking these medications   aspirin EC 81 MG tablet   levofloxacin 500 MG tablet Commonly known as:  LEVAQUIN     TAKE these medications   acetaminophen 500 MG tablet Commonly known as:  TYLENOL Take 1,000 mg by mouth  every 6 (six) hours as needed for moderate pain.   amLODipine 5 MG tablet Commonly known as:  NORVASC Take 5 mg by mouth daily.   ELIQUIS 5 MG Tabs tablet Generic drug:  apixaban Take 5 mg by mouth 2 (two) times daily.   escitalopram 10 MG tablet Commonly known as:  LEXAPRO Take 10 mg by mouth at bedtime.   gabapentin 600 MG tablet Commonly known as:  NEURONTIN Take 1,200 mg by mouth 2 (two) times daily.   metoprolol tartrate 25 MG tablet Commonly known as:  LOPRESSOR Take 1 tablet (25 mg total) by mouth 2 (two) times daily.   multivitamin with minerals Tabs tablet Take 1 tablet by mouth daily.   mupirocin ointment 2 % Commonly known as:  BACTROBAN Place 1 application into the nose 2 (two) times daily.   NYQUIL COLD & FLU PO Take 10 mLs by mouth at bedtime as needed (COLD).   omeprazole 20 MG capsule Commonly known as:  PRILOSEC Take 20 mg by mouth daily.   oxybutynin 10 MG 24 hr tablet Commonly known as:  DITROPAN-XL Take 10 mg by mouth at bedtime.   pravastatin 40 MG tablet Commonly known as:  PRAVACHOL Take 40 mg by mouth every evening.   torsemide 20 MG tablet Commonly known as:  DEMADEX Take 20 mg by mouth daily.   Vitamin D (Ergocalciferol) 50000 units Caps capsule Commonly known as:  DRISDOL Take 50,000 Units by mouth 2 (two) times a week.       Disposition:  Home Discharge Instructions    Diet - low sodium heart healthy    Complete by:  As directed    Increase activity slowly    Complete by:  As directed      Follow-up Information    [facility] Follow up on 03/20/2016.   Specialty:  Cardiology Why:  2:30PM, wound check Contact information: [address], [place]       Keirah Konitzer Meredith Leeds, MD Follow up on 06/19/2016.   Specialty:  Cardiology Why:  2:30PM Contact information: [place] [address] [phone]            Duration of Discharge Encounter: Greater than 30 minutes including physician time.  Signed, Tommye Standard, PA-C 03/06/2016 9:11 AM  I have seen and examined this patient with Tommye Standard.  Agree with above, note added to reflect my findings.  On exam, regular rhythm, no murmurs, lungs clear. Had pacemaker placed for sick sinus with A lead dislodgement.  Lead revised without abnormality.  CXR and interrogation stable.  Plan for discharge today with follow up in device clinic in 10 days.    Delane Wessinger M. Eyob Godlewski MD 03/06/2016 11:44 AM

## 2016-03-06 NOTE — Progress Notes (Signed)
Tech offered bath to Pt. Pt and daughter stated that they would take care of bath once Pt arrived home.

## 2016-03-06 NOTE — Progress Notes (Signed)
Hourly rounding done

## 2016-03-20 ENCOUNTER — Ambulatory Visit (INDEPENDENT_AMBULATORY_CARE_PROVIDER_SITE_OTHER): Payer: Medicare Other | Admitting: *Deleted

## 2016-03-20 DIAGNOSIS — I481 Persistent atrial fibrillation: Secondary | ICD-10-CM | POA: Diagnosis not present

## 2016-03-20 DIAGNOSIS — I4819 Other persistent atrial fibrillation: Secondary | ICD-10-CM

## 2016-03-20 LAB — CUP PACEART INCLINIC DEVICE CHECK
Brady Statistic AS VP Percent: 49.82 %
Brady Statistic AS VS Percent: 50.18 %
Brady Statistic RA Percent Paced: 0 %
Implantable Lead Implant Date: 20171212
Implantable Lead Location: 753859
Implantable Lead Location: 753860
Implantable Lead Model: 5076
Lead Channel Impedance Value: 475 Ohm
Lead Channel Pacing Threshold Amplitude: 0.5 V
Lead Channel Pacing Threshold Pulse Width: 0.4 ms
Lead Channel Sensing Intrinsic Amplitude: 1 mV
Lead Channel Sensing Intrinsic Amplitude: 1.875 mV
Lead Channel Sensing Intrinsic Amplitude: 12.125 mV
Lead Channel Setting Pacing Amplitude: 3.5 V
Lead Channel Setting Pacing Pulse Width: 0.4 ms
MDC IDC LEAD IMPLANT DT: 20171212
MDC IDC MSMT BATTERY REMAINING LONGEVITY: 128 mo
MDC IDC MSMT BATTERY VOLTAGE: 3.11 V
MDC IDC MSMT LEADCHNL RA IMPEDANCE VALUE: 323 Ohm
MDC IDC MSMT LEADCHNL RV IMPEDANCE VALUE: 418 Ohm
MDC IDC MSMT LEADCHNL RV IMPEDANCE VALUE: 513 Ohm
MDC IDC MSMT LEADCHNL RV SENSING INTR AMPL: 11.125 mV
MDC IDC PG IMPLANT DT: 20171212
MDC IDC SESS DTM: 20171228165123
MDC IDC SET LEADCHNL RV SENSING SENSITIVITY: 2.8 mV
MDC IDC STAT BRADY AP VP PERCENT: 0 %
MDC IDC STAT BRADY AP VS PERCENT: 0 %
MDC IDC STAT BRADY RV PERCENT PACED: 53.41 %

## 2016-03-20 NOTE — Progress Notes (Signed)
Wound check appointment. Steri-strips removed. Wound without redness or edema. Incision edges approximated, wound well healed. Normal device function. Thresholds, sensing, and impedances consistent with implant measurements. Device programmed at 3.5V for extra safety margin until 3 month visit. Histogram distribution appropriate for patient and level of activity. 100% AF- on Eliquis. No high ventricular rates noted. Patient educated about wound care, arm mobility, lifting restrictions. Theresa Cain was encouraged to keep a record of daily weights and to call our office with weight gain of 2 lbs in 1 day or 5lbs over 3 days (d/t occasional SOB). ROV with WC 06/19/16.

## 2016-05-05 ENCOUNTER — Telehealth: Payer: Self-pay | Admitting: Cardiology

## 2016-05-05 NOTE — Telephone Encounter (Signed)
New message      Pt c/o of Chest Pain: STAT if CP now or developed within 24 hours  1. Are you having CP right now? yes  2. Are you experiencing any other symptoms (ex. SOB, nausea, vomiting, sweating)? Sob, tightness,   3. How long have you been experiencing Cp? Week ago   4. Is your CP continuous or coming and going? continuous  5. Have you taken Nitroglycerin? Does not have any ?  Put pt through to triage

## 2016-05-05 NOTE — Telephone Encounter (Signed)
Spoke with daughter, Theresa Cain, [place] on file.  She states pt had PPM placed in Dec.  Pt has been extremely weak and fatigued.  It takes two people to assist her in getting up.  Friday night pt woke up during the night and could not catch her breath.  Woke up last night at 1AM with chest tightness.  This resolved after a couple of hours.  This is the second time she has had the chest tightness.  No BP's available but daughter states they have been good.  HR this morning was 49.  Pt prescribed Torsemide 20mg  QD.  Has been taking 10mg  BID to see if any improvement in weakness and fatigue but there was no improvement.  Spoke with Dr. Curt Bears and he said have pt get chest Xray and follow up with PCP and if PCP feels she needs to see him we can schedule her.  Spoke with daughter and informed her of recommendations.  PCP is in [place], [place].  Daughter states that she will call them now and see if they can do the chest xray so pt doesn't have to come all the way to [place].  Advised her to contact our office if they are unable to and we can order it.  Daughter appreciative for assistance.

## 2016-05-06 ENCOUNTER — Telehealth: Payer: Self-pay | Admitting: Cardiology

## 2016-05-06 NOTE — Telephone Encounter (Signed)
New message      Calling to give update from PCP---Dr Linton Ham.  PCP did cxr---pt is in CHF.  They doubled her fluid pill.  Daughter want to talk to nurse regarding heart valve.  Please call

## 2016-05-06 NOTE — Telephone Encounter (Signed)
Left message to call back  

## 2016-05-06 NOTE — Telephone Encounter (Signed)
Patients daughter called back.  She stated that she had talked to [facility] yesterday about mom's ECHO. She stated that [place] is getting a copy and would like a call back when she gets it.  Don't know if this will have an impact on when the patient will be schedules to come in.

## 2016-05-07 NOTE — Telephone Encounter (Signed)
Informed dtr that echo reviewed and it showed only mild valve dz -- nothing requiring intervention.  This isn't the cause of her current symptoms.  Keep current follow up in March.  Advised to call the office with further questions/concerns. (Dtr is going to inform PCP who thinks it is related to her valve) Advised dtr to also have PCP do follow up BMET after increasing Lasix.  She will call their office to arrange.

## 2016-05-09 ENCOUNTER — Ambulatory Visit: Payer: Medicare Other | Admitting: Cardiology

## 2016-05-19 ENCOUNTER — Telehealth: Payer: Self-pay | Admitting: Cardiology

## 2016-05-19 NOTE — Telephone Encounter (Signed)
New Message:    Please call asap,Pt is having an allergic reaction to her Metoprolol 25 mg. She is having real bad headaches,pain shooting through her head,cam not sleep. He hand and lips are numb and tingling and she is real weak. Pt did stop taking her Metoprolol on Friday.pt 's blood have elevated since she stopped taking it. Her blood pressure yesterday was 136/72.

## 2016-05-19 NOTE — Telephone Encounter (Signed)
Called, spoke to [place], pt's daughter (on [place]). Daughter stated pt has felt fatigued since pt started Metoprolol on 03/04/17. Headache and tingling in lips and fingers started about 2 weeks ago. BPs while taking Metoprolol have been around 116/60s. Pt decided to stop taking Metoprolol on 05/16/16, due to symptoms. BP yesterday was 136/70. Daughter stated pt is feeling much better. Will forward to Dr. Curt Bears to advise.

## 2016-05-20 MED ORDER — DILTIAZEM HCL ER COATED BEADS 180 MG PO CP24: 180.0000 mg | ORAL_CAPSULE | Freq: Every day | ORAL | 3 refills | Status: DC

## 2016-05-20 NOTE — Telephone Encounter (Signed)
Advised to start Diltiazem 180 mg once daily for blood pressure control.  Dtr verbalized understanding and will pick up for pt this afternoon and have her start it.

## 2016-06-10 ENCOUNTER — Encounter: Payer: Self-pay | Admitting: Cardiology

## 2016-06-19 ENCOUNTER — Encounter (INDEPENDENT_AMBULATORY_CARE_PROVIDER_SITE_OTHER): Payer: Self-pay

## 2016-06-19 ENCOUNTER — Encounter: Payer: Self-pay | Admitting: Cardiology

## 2016-06-19 ENCOUNTER — Ambulatory Visit (INDEPENDENT_AMBULATORY_CARE_PROVIDER_SITE_OTHER): Payer: Medicare Other | Admitting: Cardiology

## 2016-06-19 VITALS — BP 114/60 | HR 57 | Ht 72.0 in | Wt 206.0 lb

## 2016-06-19 DIAGNOSIS — I4819 Other persistent atrial fibrillation: Secondary | ICD-10-CM

## 2016-06-19 DIAGNOSIS — I481 Persistent atrial fibrillation: Secondary | ICD-10-CM

## 2016-06-19 DIAGNOSIS — I495 Sick sinus syndrome: Secondary | ICD-10-CM

## 2016-06-19 DIAGNOSIS — Z950 Presence of cardiac pacemaker: Secondary | ICD-10-CM

## 2016-06-19 LAB — CUP PACEART INCLINIC DEVICE CHECK
Battery Remaining Longevity: 109 mo
Brady Statistic AP VS Percent: 0 %
Brady Statistic AS VS Percent: 51.29 %
Brady Statistic RV Percent Paced: 52.39 %
Implantable Lead Implant Date: 20171212
Implantable Lead Location: 753860
Implantable Lead Model: 5076
Lead Channel Impedance Value: 304 Ohm
Lead Channel Impedance Value: 437 Ohm
Lead Channel Impedance Value: 513 Ohm
Lead Channel Pacing Threshold Amplitude: 0.5 V
Lead Channel Pacing Threshold Pulse Width: 0.4 ms
Lead Channel Sensing Intrinsic Amplitude: 10.375 mV
Lead Channel Setting Pacing Amplitude: 2.5 V
Lead Channel Setting Sensing Sensitivity: 2.8 mV
MDC IDC LEAD IMPLANT DT: 20171212
MDC IDC LEAD LOCATION: 753859
MDC IDC MSMT BATTERY VOLTAGE: 3.03 V
MDC IDC MSMT LEADCHNL RA SENSING INTR AMPL: 0.625 mV
MDC IDC MSMT LEADCHNL RA SENSING INTR AMPL: 1.25 mV
MDC IDC MSMT LEADCHNL RV IMPEDANCE VALUE: 399 Ohm
MDC IDC MSMT LEADCHNL RV SENSING INTR AMPL: 12.5 mV
MDC IDC PG IMPLANT DT: 20171212
MDC IDC SESS DTM: 20180329150259
MDC IDC SET LEADCHNL RV PACING PULSEWIDTH: 0.4 ms
MDC IDC STAT BRADY AP VP PERCENT: 0 %
MDC IDC STAT BRADY AS VP PERCENT: 48.71 %
MDC IDC STAT BRADY RA PERCENT PACED: 0 %

## 2016-06-19 MED ORDER — AMIODARONE HCL 200 MG PO TABS: 200.0000 mg | ORAL_TABLET | Freq: Every day | ORAL | 0 refills | Status: DC

## 2016-06-19 MED ORDER — AMIODARONE HCL 200 MG PO TABS: ORAL_TABLET | ORAL | 0 refills | Status: DC

## 2016-06-19 NOTE — Progress Notes (Signed)
Electrophysiology Office Note   Date:  06/19/2016   ID:  Theresa Cain, DOB 27-Dec-1926, MRN [id number]  PCP:  Kendrick Ranch, MD  Cardiologist:  [facility] Primary Electrophysiologist:  Thelbert Gartin Meredith Leeds, MD    Chief Complaint  Patient presents with  . Pacemaker Check    Tachy-Brady syndrome     History of Present Illness: Theresa Cain is a 83 y.o. female who presents today for electrophysiology evaluation.   History of atrial fibrillation, carotid artery disease, hypertension, CVA, peripheral vascular disease. Presented to her primary cardiologist due to lightheadedness, vertigo, and she'll be headed feeling. She says that she was diagnosed with atrial fibrillation. 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 Says that she feels much better after pacemaker placement. Unfortunately, she does have some episodes of fatigue and shortness of breath. These happen on random days that are not predictably.   Today, she denies symptoms of palpitations, chest pain, orthopnea, PND, lower extremity edema, claudication, dizziness, presyncope, syncope, bleeding, or neurologic sequela. The patient is tolerating medications without difficulties and is otherwise without complaint today.    Past Medical History:  Diagnosis Date  . A-fib ([place])   . Angina at rest [facility])   . Anxiety   . Aortic valve stenosis   . Bilateral edema of lower extremity   . Bradycardia   . Carotid artery occlusion   . CHF (congestive heart failure) ([place])   . GERD (gastroesophageal reflux disease)   . Heart murmur   . High cholesterol   . Hypertension   . Obesity   . Osteoarthritis   . Presence of permanent cardiac pacemaker   . PVD (peripheral vascular disease) ([place])   . SOB (shortness of breath)   . Stomach cancer ([place]) ~ 2013  . Vertigo    Past Surgical History:  Procedure Laterality Date  . CARDIOVASCULAR STRESS TEST    . CAROTID ENDARTERECTOMY Right ~ 2005  . CATARACT  EXTRACTION W/ INTRAOCULAR LENS IMPLANT Right   . CHOLECYSTECTOMY  ~ 2013   "part of her [facility] OR"  . EP IMPLANTABLE DEVICE N/A 03/04/2016   Procedure: Pacemaker Implant;  Surgeon: Esmay Amspacher Meredith Leeds, MD;  Location: [facility];  Service: Cardiovascular;  Laterality: N/A;  . EP IMPLANTABLE DEVICE N/A 03/05/2016   Procedure: Lead Revision/Repair;  Surgeon: Deboraha Sprang, MD;  Location: [facility];  Service: Cardiovascular;  Laterality: N/A;  . INSERT / REPLACE / REMOVE PACEMAKER    . US ECHOCARDIOGRAPHY    . VAGINAL HYSTERECTOMY    . WHIPPLE PROCEDURE  ~ 2013     Current Outpatient Prescriptions  Medication Sig Dispense Refill  . acetaminophen (TYLENOL) 500 MG tablet Take 1,000 mg by mouth every 6 (six) hours as needed for moderate pain.    Marland Kitchen amLODipine (NORVASC) 5 MG tablet Take 5 mg by mouth daily.    Marland Kitchen apixaban (ELIQUIS) 5 MG TABS tablet Take 5 mg by mouth 2 (two) times daily.    Marland Kitchen diltiazem (CARDIZEM CD) 180 MG 24 hr capsule Take 1 capsule (180 mg total) by mouth daily. 90 capsule 3  . DM-Doxylamine-Acetaminophen (NYQUIL COLD & FLU PO) Take 10 mLs by mouth at bedtime as needed (COLD).    Marland Kitchen escitalopram (LEXAPRO) 10 MG tablet Take 10 mg by mouth at bedtime.     . gabapentin (NEURONTIN) 600 MG tablet Take 1,200 mg by mouth 2 (two) times daily.     . Multiple Vitamin (MULTIVITAMIN WITH MINERALS) TABS  tablet Take 1 tablet by mouth daily.    Marland Kitchen omeprazole (PRILOSEC) 20 MG capsule Take 20 mg by mouth daily.    Marland Kitchen oxybutynin (DITROPAN-XL) 10 MG 24 hr tablet Take 10 mg by mouth at bedtime.    . pravastatin (PRAVACHOL) 40 MG tablet Take 40 mg by mouth every evening.     . torsemide (DEMADEX) 20 MG tablet Take 20 mg by mouth daily.    . Vitamin D, Ergocalciferol, (DRISDOL) 50000 units CAPS capsule Take 50,000 Units by mouth 2 (two) times a week.     No current facility-administered medications for this visit.     Allergies:   Penicillins and Sulfa antibiotics   Social  History:  The patient  reports that she has never smoked. She has never used smokeless tobacco. She reports that she does not drink alcohol or use drugs.   Family History:  The patient's family history includes Heart disease in her sister.    ROS:  Please see the history of present illness.   Otherwise, review of systems is positive for fatigue, leg swelling, DOE, joint swelling, headaches, dizziness.   All other systems are reviewed and negative.    PHYSICAL EXAM: VS:  BP 114/60   Pulse (!) 57   Ht 6' (1.829 m)   Wt 206 lb (93.4 kg)   BMI 27.94 kg/m  , BMI Body mass index is 27.94 kg/m. GEN: Well nourished, well developed, in no acute distress  HEENT: normal  Neck: no JVD, carotid bruits, or masses Cardiac: iRRR; 2/6 systolic murmur at LUSB, rubs, or gallops,no edema  Respiratory:  clear to auscultation bilaterally, normal work of breathing GI: soft, nontender, nondistended, + BS MS: no deformity or atrophy  Skin: warm and dry Neuro:  Strength and sensation are intact Psych: euthymic mood, full affect  EKG:  EKG is ordered today. Personal review of the ekg ordered shows atrial fibrillation, intermittent V pacing  Recent Labs: 02/22/2016: B Natriuretic Peptide 390.0 03/03/2016: Hemoglobin 9.5; Platelets 255 03/05/2016: BUN 10; Creatinine, Ser 1.05; Potassium 4.3; Sodium 138    Lipid Panel  No results found for: CHOL, TRIG, HDL, CHOLHDL, VLDL, LDLCALC, LDLDIRECT   Wt Readings from Last 3 Encounters:  06/19/16 206 lb (93.4 kg)  03/06/16 202 lb 2.6 oz (91.7 kg)  03/03/16 210 lb 12.8 oz (95.6 kg)      Other studies Reviewed: Additional studies/ records that were reviewed today include: 12/31/15 TTE Review of the above records today demonstrates:  LVEF>55%, Moderate aortic stenosis, peak gradient 57.8 mmHg (see care everywhere)   ASSESSMENT AND PLAN:  1.  Paroxysmal atrial fibrillation: Currently anticoagulated with Eliquis. She is continuing to have episodes of  fatigue that is potentially associated with atrial fibrillation. We'll therefore start her on amiodarone today and have her cardioverted in 2 weeks after amiodarone load.  This patients CHA2DS2-VASc Score and unadjusted Ischemic Stroke Rate (% per year) is equal to 7.2 % stroke rate/year from a score of 5  Above score calculated as 1 point each if present [CHF, HTN, DM, Vascular=MI/PAD/Aortic Plaque, Age if 65-74, or Female] Above score calculated as 2 points each if present [Age > 75, or Stroke/TIA/TE]   2. Tachy-brady syndrome: Medtronic dual chamber pacemaker placed 03/04/16. Device functioning appropriately. No changes necessary today.  3. Aortic stenosis: Found to be moderate on her echo from 12/31/15 by Dr. Geronimo Running. Continue current monitoring. Current medicines are reviewed at length with the patient today.   The patient does not have concerns  regarding her medicines.  The following changes were made today:  Start amiodarone  Labs/ tests ordered today include:  Orders Placed This Encounter  Procedures  . EKG 12-Lead     Disposition:   FU with Shaka Cardin 3 months  Signed, Miryah Ralls Meredith Leeds, MD  06/19/2016 2:42 PM     [address] [place] [address] [phone] (office) [phone] (fax)

## 2016-06-19 NOTE — Patient Instructions (Signed)
Medication Instructions:    Your physician has recommended you make the following change in your medication: 1) START Amiodarone  - take 400 mg (2 tablets) twice daily for two weeks, then  - take 400 mg (2 tablets ) once daily for two weeks, then  - take 200 mg (1 tablets) once daily  --- If you need a refill on your cardiac medications before your next appointment, please call your pharmacy. ---  Labwork:  None ordered  Testing/Procedures: Your physician has recommended that you have a Cardioversion (DCCV). Electrical Cardioversion uses a jolt of electricity to your heart either through paddles or wired patches attached to your chest. This is a controlled, usually prescheduled, procedure. Defibrillation is done under light anesthesia in the hospital, and you usually go home the day of the procedure. This is done to get your heart back into a normal rhythm. You are not awake for the procedure.  Sherin Murdoch, RN will call you to arrange this procedure.  Follow-Up: Your physician recommends that you schedule a follow-up appointment in: 6 weeks with Dr. Curt Bears.  Thank you for choosing [facility]!!   Trinidad Curet, RN [phone]   Any Other Special Instructions Will Be Listed Below (If Applicable).   Electrical Cardioversion Electrical cardioversion is the delivery of a jolt of electricity to restore a normal rhythm to the heart. A rhythm that is too fast or is not regular keeps the heart from pumping well. In this procedure, sticky patches or metal paddles are placed on the chest to deliver electricity to the heart from a device. This procedure may be done in an emergency if:  There is low or no blood pressure as a result of the heart rhythm.  Normal rhythm must be restored as fast as possible to protect the brain and heart from further damage.  It may save a life. This procedure may also be done for irregular or fast heart rhythms that are not immediately  life-threatening. Tell a health care provider about:  Any allergies you have.  All medicines you are taking, including vitamins, herbs, eye drops, creams, and over-the-counter medicines.  Any problems you or family members have had with anesthetic medicines.  Any blood disorders you have.  Any surgeries you have had.  Any medical conditions you have.  Whether you are pregnant or may be pregnant. What are the risks? Generally, this is a safe procedure. However, problems may occur, including:  Allergic reactions to medicines.  A blood clot that breaks free and travels to other parts of your body.  The possible return of an abnormal heart rhythm within hours or days after the procedure.  Your heart stopping (cardiac arrest). This is rare. What happens before the procedure? Medicines   Your health care provider may have you start taking:  Blood-thinning medicines (anticoagulants) so your blood does not clot as easily.  Medicines may be given to help stabilize your heart rate and rhythm.  Ask your health care provider about changing or stopping your regular medicines. This is especially important if you are taking diabetes medicines or blood thinners. General instructions   Plan to have someone take you home from the hospital or clinic.  If you will be going home right after the procedure, plan to have someone with you for 24 hours.  Follow instructions from your health care provider about eating or drinking restrictions. What happens during the procedure?  To lower your risk of infection:  Your health care team will wash or  sanitize their hands.  Your skin will be washed with soap.  An IV tube will be inserted into one of your veins.  You will be given a medicine to help you relax (sedative).  Sticky patches (electrodes) or metal paddles may be placed on your chest.  An electrical shock will be delivered. The procedure may vary among health care providers and  hospitals. What happens after the procedure?  Your blood pressure, heart rate, breathing rate, and blood oxygen level will be monitored until the medicines you were given have worn off.  Do not drive for 24 hours if you were given a sedative.  Your heart rhythm will be watched to make sure it does not change. This information is not intended to replace advice given to you by your health care provider. Make sure you discuss any questions you have with your health care provider. Document Released: 02/28/2002 Document Revised: 11/07/2015 Document Reviewed: 09/14/2015 Elsevier Interactive Patient Education  2017 [place].     Amiodarone tablets What is this medicine? AMIODARONE (a MEE oh da rone) is an antiarrhythmic drug. It helps make your heart beat regularly. Because of the side effects caused by this medicine, it is only used when other medicines have not worked. It is usually used for heartbeat problems that may be life threatening. This medicine may be used for other purposes; ask your health care provider or pharmacist if you have questions. COMMON BRAND NAME(S): Cordarone, Pacerone What should I tell my health care provider before I take this medicine? They need to know if you have any of these conditions: -liver disease -lung disease -other heart problems -thyroid disease -an unusual or allergic reaction to amiodarone, iodine, other medicines, foods, dyes, or preservatives -pregnant or trying to get pregnant -breast-feeding How should I use this medicine? Take this medicine by mouth with a glass of water. Follow the directions on the prescription label. You can take this medicine with or without food. However, you should always take it the same way each time. Take your doses at regular intervals. Do not take your medicine more often than directed. Do not stop taking except on the advice of your doctor or health care professional. A special MedGuide will be given to you by  the pharmacist with each prescription and refill. Be sure to read this information carefully each time. Talk to your pediatrician regarding the use of this medicine in children. Special care may be needed. Overdosage: If you think you have taken too much of this medicine contact a poison control center or emergency room at once. NOTE: This medicine is only for you. Do not share this medicine with others. What if I miss a dose? If you miss a dose, take it as soon as you can. If it is almost time for your next dose, take only that dose. Do not take double or extra doses. What may interact with this medicine? Do not take this medicine with any of the following medications: -abarelix -apomorphine -arsenic trioxide -certain antibiotics like erythromycin, gemifloxacin, levofloxacin, pentamidine -certain medicines for depression like amoxapine, tricyclic antidepressants -certain medicines for fungal infections like fluconazole, itraconazole, ketoconazole, posaconazole, voriconazole -certain medicines for irregular heart beat like disopyramide, dofetilide, dronedarone, ibutilide, propafenone, sotalol -certain medicines for malaria like chloroquine, halofantrine -cisapride -droperidol -haloperidol -hawthorn -maprotiline -methadone -phenothiazines like chlorpromazine, mesoridazine, thioridazine -pimozide -ranolazine -red yeast rice -vardenafil -ziprasidone This medicine may also interact with the following medications: -antiviral medicines for HIV or AIDS -certain medicines for blood pressure, heart  disease, irregular heart beat -certain medicines for cholesterol like atorvastatin, cerivastatin, lovastatin, simvastatin -certain medicines for hepatitis C like sofosbuvir and ledipasvir; sofosbuvir -certain medicines for seizures like phenytoin -certain medicines for thyroid problems -certain medicines that treat or prevent blood clots like  warfarin -cholestyramine -cimetidine -clopidogrel -cyclosporine -dextromethorphan -diuretics -fentanyl -general anesthetics -grapefruit juice -lidocaine -loratadine -methotrexate -other medicines that prolong the QT interval (cause an abnormal heart rhythm) -procainamide -quinidine -rifabutin, rifampin, or rifapentine -St. John's Wort -trazodone This list may not describe all possible interactions. Give your health care provider a list of all the medicines, herbs, non-prescription drugs, or dietary supplements you use. Also tell them if you smoke, drink alcohol, or use illegal drugs. Some items may interact with your medicine. What should I watch for while using this medicine? Your condition will be monitored closely when you first begin therapy. Often, this drug is first started in a hospital or other monitored health care setting. Once you are on maintenance therapy, visit your doctor or health care professional for regular checks on your progress. Because your condition and use of this medicine carry some risk, it is a good idea to carry an identification card, necklace or bracelet with details of your condition, medications, and doctor or health care professional. Dennis Bast may get drowsy or dizzy. Do not drive, use machinery, or do anything that needs mental alertness until you know how this medicine affects you. Do not stand or sit up quickly, especially if you are an older patient. This reduces the risk of dizzy or fainting spells. This medicine can make you more sensitive to the sun. Keep out of the sun. If you cannot avoid being in the sun, wear protective clothing and use sunscreen. Do not use sun lamps or tanning beds/booths. You should have regular eye exams before and during treatment. Call your doctor if you have blurred vision, see halos, or your eyes become sensitive to light. Your eyes may get dry. It may be helpful to use a lubricating eye solution or artificial tears  solution. If you are going to have surgery or a procedure that requires contrast dyes, tell your doctor or health care professional that you are taking this medicine. What side effects may I notice from receiving this medicine? Side effects that you should report to your doctor or health care professional as soon as possible: -allergic reactions like skin rash, itching or hives, swelling of the face, lips, or tongue -blue-gray coloring of the skin -blurred vision, seeing blue green halos, increased sensitivity of the eyes to light -breathing problems -chest pain -dark urine -fast, irregular heartbeat -feeling faint or light-headed -intolerance to heat or cold -nausea or vomiting -pain and swelling of the scrotum -pain, tingling, numbness in feet, hands -redness, blistering, peeling or loosening of the skin, including inside the mouth -spitting up blood -stomach pain -sweating -unusual or uncontrolled movements of body -unusually weak or tired -weight gain or loss -yellowing of the eyes or skin Side effects that usually do not require medical attention (report to your doctor or health care professional if they continue or are bothersome): -change in sex drive or performance -constipation -dizziness -headache -loss of appetite -trouble sleeping This list may not describe all possible side effects. Call your doctor for medical advice about side effects. You may report side effects to FDA at 1-800-FDA-1088. Where should I keep my medicine? Keep out of the reach of children. Store at room temperature between 20 and 25 degrees C (68 and 77 degrees F).  Protect from light. Keep container tightly closed. Throw away any unused medicine after the expiration date. NOTE: This sheet is a summary. It may not cover all possible information. If you have questions about this medicine, talk to your doctor, pharmacist, or health care provider.  2018 Elsevier/Gold Standard (2013-06-13 19:48:11)

## 2016-07-15 ENCOUNTER — Telehealth: Payer: Self-pay | Admitting: Cardiology

## 2016-07-15 NOTE — Telephone Encounter (Signed)
New message    Theresa Cain from [facility] patient care is calling to find out if EKG was received that she faxed over

## 2016-07-17 NOTE — Telephone Encounter (Signed)
EKG received and reviewed by Dr. Curt Bears yesterday, 4/25. Not in AFib.  Will see pt at scheduled follow up. Patient verbalized understanding and agreeable to plan.

## 2016-08-06 ENCOUNTER — Other Ambulatory Visit: Payer: Self-pay | Admitting: Cardiology

## 2016-08-06 ENCOUNTER — Encounter: Payer: Self-pay | Admitting: *Deleted

## 2016-08-06 ENCOUNTER — Ambulatory Visit (INDEPENDENT_AMBULATORY_CARE_PROVIDER_SITE_OTHER): Payer: Medicare Other | Admitting: Cardiology

## 2016-08-06 ENCOUNTER — Encounter: Payer: Self-pay | Admitting: Cardiology

## 2016-08-06 VITALS — BP 140/74 | HR 58 | Ht 72.0 in | Wt 212.2 lb

## 2016-08-06 DIAGNOSIS — Z01812 Encounter for preprocedural laboratory examination: Secondary | ICD-10-CM

## 2016-08-06 DIAGNOSIS — I481 Persistent atrial fibrillation: Secondary | ICD-10-CM

## 2016-08-06 DIAGNOSIS — I4819 Other persistent atrial fibrillation: Secondary | ICD-10-CM

## 2016-08-06 DIAGNOSIS — I495 Sick sinus syndrome: Secondary | ICD-10-CM | POA: Diagnosis not present

## 2016-08-06 LAB — CUP PACEART INCLINIC DEVICE CHECK
Battery Remaining Longevity: 108 mo
Brady Statistic AP VP Percent: 0 %
Brady Statistic AP VS Percent: 0 %
Brady Statistic RA Percent Paced: 0 %
Brady Statistic RV Percent Paced: 46.32 %
Date Time Interrogation Session: 20180516113057
Implantable Lead Implant Date: 20171212
Implantable Lead Location: 753859
Implantable Lead Model: 5076
Lead Channel Impedance Value: 380 Ohm
Lead Channel Impedance Value: 494 Ohm
Lead Channel Sensing Intrinsic Amplitude: 0.75 mV
Lead Channel Sensing Intrinsic Amplitude: 9.875 mV
MDC IDC LEAD IMPLANT DT: 20171212
MDC IDC LEAD LOCATION: 753860
MDC IDC MSMT BATTERY VOLTAGE: 3.03 V
MDC IDC MSMT LEADCHNL RA IMPEDANCE VALUE: 285 Ohm
MDC IDC MSMT LEADCHNL RA IMPEDANCE VALUE: 399 Ohm
MDC IDC MSMT LEADCHNL RA SENSING INTR AMPL: 1.75 mV
MDC IDC MSMT LEADCHNL RV PACING THRESHOLD AMPLITUDE: 0.75 V
MDC IDC MSMT LEADCHNL RV PACING THRESHOLD PULSEWIDTH: 0.4 ms
MDC IDC MSMT LEADCHNL RV SENSING INTR AMPL: 10.375 mV
MDC IDC PG IMPLANT DT: 20171212
MDC IDC SET LEADCHNL RV PACING AMPLITUDE: 2.5 V
MDC IDC SET LEADCHNL RV PACING PULSEWIDTH: 0.4 ms
MDC IDC SET LEADCHNL RV SENSING SENSITIVITY: 2.8 mV
MDC IDC STAT BRADY AS VP PERCENT: 43.03 %
MDC IDC STAT BRADY AS VS PERCENT: 56.97 %

## 2016-08-06 LAB — BASIC METABOLIC PANEL
BUN / CREAT RATIO: 13 (ref 12–28)
BUN: 19 mg/dL (ref 8–27)
CO2: 25 mmol/L (ref 18–29)
CREATININE: 1.52 mg/dL — AB (ref 0.57–1.00)
Calcium: 8.6 mg/dL — ABNORMAL LOW (ref 8.7–10.3)
Chloride: 97 mmol/L (ref 96–106)
GFR calc Af Amer: 35 mL/min/{1.73_m2} — ABNORMAL LOW (ref >59)
GFR, EST NON AFRICAN AMERICAN: 31 mL/min/{1.73_m2} — AB (ref >59)
Glucose: 109 mg/dL — ABNORMAL HIGH (ref 65–99)
Potassium: 4.6 mmol/L (ref 3.5–5.2)
SODIUM: 140 mmol/L (ref 134–144)

## 2016-08-06 LAB — CBC WITH DIFFERENTIAL/PLATELET
BASOS: 1 %
Basophils Absolute: 0.1 10*3/uL (ref 0.0–0.2)
EOS (ABSOLUTE): 0.3 10*3/uL (ref 0.0–0.4)
EOS: 5 %
HEMOGLOBIN: 8.6 g/dL — AB (ref 11.1–15.9)
Hematocrit: 28.2 % — ABNORMAL LOW (ref 34.0–46.6)
IMMATURE GRANULOCYTES: 0 %
Immature Grans (Abs): 0 10*3/uL (ref 0.0–0.1)
LYMPHS ABS: 1.2 10*3/uL (ref 0.7–3.1)
Lymphs: 20 %
MCH: 23.9 pg — ABNORMAL LOW (ref 26.6–33.0)
MCHC: 30.5 g/dL — AB (ref 31.5–35.7)
MCV: 78 fL — AB (ref 79–97)
MONOCYTES: 6 %
MONOS ABS: 0.4 10*3/uL (ref 0.1–0.9)
Neutrophils Absolute: 4 10*3/uL (ref 1.4–7.0)
Neutrophils: 68 %
Platelets: 170 10*3/uL (ref 150–379)
RBC: 3.6 x10E6/uL — ABNORMAL LOW (ref 3.77–5.28)
RDW: 17.3 % — AB (ref 12.3–15.4)
WBC: 5.9 10*3/uL (ref 3.4–10.8)

## 2016-08-06 MED ORDER — AMIODARONE HCL 200 MG PO TABS: 200.0000 mg | ORAL_TABLET | Freq: Every day | ORAL | 2 refills | Status: DC

## 2016-08-06 NOTE — Patient Instructions (Addendum)
Medication Instructions:    Your physician recommends that you continue on your current medications as directed. Please refer to the Current Medication list given to you today.  Labwork:  Pre procedure labs today: BMET & CBC w/ diff  Testing/Procedures: Your physician has recommended that you have a Cardioversion (DCCV). Electrical Cardioversion uses a jolt of electricity to your heart either through paddles or wired patches attached to your chest. This is a controlled, usually prescheduled, procedure. Defibrillation is done under light anesthesia in the hospital, and you usually go home the day of the procedure. This is done to get your heart back into a normal rhythm. You are not awake for the procedure. Please see the instruction sheet given to you today.  Follow-Up:  Your physician recommends that you schedule a follow-up appointment in: 4 weeks with Dr. Curt Bears.  - If you need a refill on your cardiac medications before your next appointment, please call your pharmacy.    Thank you for choosing [facility]!!   Trinidad Curet, RN [phone]   Any Other Special Instructions Will Be Listed Below (If Applicable).   Electrical Cardioversion Electrical cardioversion is the delivery of a jolt of electricity to restore a normal rhythm to the heart. A rhythm that is too fast or is not regular keeps the heart from pumping well. In this procedure, sticky patches or metal paddles are placed on the chest to deliver electricity to the heart from a device. This procedure may be done in an emergency if:  There is low or no blood pressure as a result of the heart rhythm.  Normal rhythm must be restored as fast as possible to protect the brain and heart from further damage.  It may save a life. This procedure may also be done for irregular or fast heart rhythms that are not immediately life-threatening. Tell a health care provider about:  Any allergies you have.  All medicines you  are taking, including vitamins, herbs, eye drops, creams, and over-the-counter medicines.  Any problems you or family members have had with anesthetic medicines.  Any blood disorders you have.  Any surgeries you have had.  Any medical conditions you have.  Whether you are pregnant or may be pregnant. What are the risks? Generally, this is a safe procedure. However, problems may occur, including:  Allergic reactions to medicines.  A blood clot that breaks free and travels to other parts of your body.  The possible return of an abnormal heart rhythm within hours or days after the procedure.  Your heart stopping (cardiac arrest). This is rare. What happens before the procedure? Medicines   Your health care provider may have you start taking:  Blood-thinning medicines (anticoagulants) so your blood does not clot as easily.  Medicines may be given to help stabilize your heart rate and rhythm.  Ask your health care provider about changing or stopping your regular medicines. This is especially important if you are taking diabetes medicines or blood thinners. General instructions   Plan to have someone take you home from the hospital or clinic.  If you will be going home right after the procedure, plan to have someone with you for 24 hours.  Follow instructions from your health care provider about eating or drinking restrictions. What happens during the procedure?  To lower your risk of infection:  Your health care team will wash or sanitize their hands.  Your skin will be washed with soap.  An IV tube will be inserted into one  of your veins.  You will be given a medicine to help you relax (sedative).  Sticky patches (electrodes) or metal paddles may be placed on your chest.  An electrical shock will be delivered. The procedure may vary among health care providers and hospitals. What happens after the procedure?  Your blood pressure, heart rate, breathing rate, and  blood oxygen level will be monitored until the medicines you were given have worn off.  Do not drive for 24 hours if you were given a sedative.  Your heart rhythm will be watched to make sure it does not change. This information is not intended to replace advice given to you by your health care provider. Make sure you discuss any questions you have with your health care provider. Document Released: 02/28/2002 Document Revised: 11/07/2015 Document Reviewed: 09/14/2015 Elsevier Interactive Patient Education  2017 [employer].

## 2016-08-06 NOTE — Progress Notes (Signed)
Electrophysiology Office Note   Date:  08/06/2016   ID:  Theresa Cain, DOB [birth year], MRN [id number]  PCP:  Theresa Ranch, MD  Cardiologist:  Theresa Cain Primary Electrophysiologist:  Theresa Allbaugh Meredith Leeds, MD    Chief Complaint  Patient presents with  . Pacemaker Check    PAF/Tach-Brady syndrome     History of Present Illness: Theresa Cain is a 83 y.o. female who presents today for electrophysiology evaluation.   History of atrial fibrillation, carotid artery disease, hypertension, CVA, peripheral vascular disease. Presented to her primary cardiologist due to lightheadedness, vertigo, and she'll be headed feeling. She says that she was diagnosed with atrial fibrillation. 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 She was started on amiodarone at her last clinic visit due to recurrent atrial fibrillation. She remains in atrial fibrillation today. She has been in atrial fibrillation since December. She complains of quite a bit of fatigue and shortness of breath. She came into the clinic today in a wheelchair. She has not had palpitations, chest pain, PND, orthopnea, or edema. She has not had syncope or presyncope. Her main complaint is fatigue and dyspnea on exertion.    Past Medical History:  Diagnosis Date  . A-fib ([place])   . Angina at rest [facility])   . Anxiety   . Aortic valve stenosis   . Bilateral edema of lower extremity   . Bradycardia   . Carotid artery occlusion   . CHF (congestive heart failure) ([place])   . GERD (gastroesophageal reflux disease)   . Heart murmur   . High cholesterol   . Hypertension   . Obesity   . Osteoarthritis   . Presence of permanent cardiac pacemaker   . PVD (peripheral vascular disease) ([place])   . SOB (shortness of breath)   . Stomach cancer ([place]) ~ 2013  . Vertigo    Past Surgical History:  Procedure Laterality Date  . CARDIOVASCULAR STRESS TEST    . CAROTID ENDARTERECTOMY Right ~ 2005  . CATARACT  EXTRACTION W/ INTRAOCULAR LENS IMPLANT Right   . CHOLECYSTECTOMY  ~ 2013   "part of her [facility] OR"  . EP IMPLANTABLE DEVICE N/A 03/04/2016   Procedure: Pacemaker Implant;  Surgeon: Theresa Petion Meredith Leeds, MD;  Location: [place] [facility];  Service: Cardiovascular;  Laterality: N/A;  . EP IMPLANTABLE DEVICE N/A 03/05/2016   Procedure: Lead Revision/Repair;  Surgeon: Theresa Sprang, MD;  Location: [place] [facility];  Service: Cardiovascular;  Laterality: N/A;  . INSERT / REPLACE / REMOVE PACEMAKER    . US ECHOCARDIOGRAPHY    . VAGINAL HYSTERECTOMY    . WHIPPLE PROCEDURE  ~ 2013     Current Outpatient Prescriptions  Medication Sig Dispense Refill  . acetaminophen (TYLENOL) 500 MG tablet Take 1,000 mg by mouth every 6 (six) hours as needed for moderate pain.    Marland Kitchen amiodarone (PACERONE) 200 MG tablet Take 1 tablet (200 mg total) by mouth daily. 90 tablet 0  . apixaban (ELIQUIS) 5 MG TABS tablet Take 5 mg by mouth 2 (two) times daily.    Marland Kitchen diltiazem (CARDIZEM CD) 180 MG 24 hr capsule Take 1 capsule (180 mg total) by mouth daily. 90 capsule 3  . escitalopram (LEXAPRO) 10 MG tablet Take 10 mg by mouth at bedtime.     . gabapentin (NEURONTIN) 600 MG tablet Take 1,200 mg by mouth 2 (two) times daily.     . Multiple Vitamin (MULTIVITAMIN WITH MINERALS) TABS tablet Take 1 tablet  by mouth daily.    Marland Kitchen omeprazole (PRILOSEC) 20 MG capsule Take 20 mg by mouth daily.    Marland Kitchen oxybutynin (DITROPAN-XL) 10 MG 24 hr tablet Take 10 mg by mouth at bedtime.    . pravastatin (PRAVACHOL) 40 MG tablet Take 40 mg by mouth every evening.     . torsemide (DEMADEX) 20 MG tablet Take 20 mg by mouth daily.    . Vitamin D, Ergocalciferol, (DRISDOL) 50000 units CAPS capsule Take 50,000 Units by mouth 2 (two) times a week.     No current facility-administered medications for this visit.     Allergies:   Penicillins and Sulfa antibiotics   Social History:  The patient  reports that she has never smoked. She has never used  smokeless tobacco. She reports that she does not drink alcohol or use drugs.   Family History:  The patient's family history includes Heart disease in her sister.    ROS:  Please see the history of present illness.   Otherwise, review of systems is positive for fatigue, dyspnea on exertion, back pain, dizziness.   All other systems are reviewed and negative.   PHYSICAL EXAM: VS:  BP 140/74   Pulse (!) 58   Ht 6' (1.829 m)   Wt 212 lb 3.2 oz (96.3 kg)   BMI 28.78 kg/m  , BMI Body mass index is 28.78 kg/m. GEN: Well nourished, well developed, in no acute distress  HEENT: normal  Neck: no JVD, carotid bruits, or masses Cardiac: RRR; no murmurs, rubs, or gallops,no edema  Respiratory:  clear to auscultation bilaterally, normal work of breathing GI: soft, nontender, nondistended, + BS MS: no deformity or atrophy  Skin: warm and dry, device site well healed Neuro:  Strength and sensation are intact Psych: euthymic mood, full affect   EKG:  EKG is ordered today. Personal review of the ekg ordered shows AF, LAD  Personal review of the device interrogation today. Results in [place]: 02/22/2016: B Natriuretic Peptide 390.0 03/03/2016: Hemoglobin 9.5; Platelets 255 03/05/2016: BUN 10; Creatinine, Ser 1.05; Potassium 4.3; Sodium 138    Lipid Panel  No results found for: CHOL, TRIG, HDL, CHOLHDL, VLDL, LDLCALC, LDLDIRECT   Wt Readings from Last 3 Encounters:  08/06/16 212 lb 3.2 oz (96.3 kg)  06/19/16 206 lb (93.4 kg)  03/06/16 202 lb 2.6 oz (91.7 kg)      Other studies Reviewed: Additional studies/ records that were reviewed today include: 12/31/15 TTE Review of the above records today demonstrates:  LVEF>55%, Moderate aortic stenosis, peak gradient 57.8 mmHg (see care everywhere)   ASSESSMENT AND PLAN:  1.  Persistent atrial fibrillation: Currently anticoagulated on Eliquis. Has been loaded on amiodarone. Does remain in atrial fibrillation today despite  amiodarone load. She likely Theresa Cain need cardioversion to return to sinus rhythm. Risks and benefits were discussed. She understands the risks and has agreed to the procedure.  This patients CHA2DS2-VASc Score and unadjusted Ischemic Stroke Rate (% per year) is equal to 7.2 % stroke rate/year from a score of 5  Above score calculated as 1 point each if present [CHF, HTN, DM, Vascular=MI/PAD/Aortic Plaque, Age if 65-74, or Female] Above score calculated as 2 points each if present [Age > 75, or Stroke/TIA/TE]   2. Tachy-brady syndrome: Medtronic dual chamber pacemaker placed 1111/03/10. Functioning properly with no changes made today.  3. Aortic stenosis: Found to be moderate on her previous echo. Should she not improve after cardioversion, she may benefit from an  echo and evaluation for valve replacement.  Current medicines are reviewed at length with the patient today.   The patient does not have concerns regarding her medicines.  The following changes were made today:  none  Labs/ tests ordered today include:  Orders Placed This Encounter  Procedures  . Basic Metabolic Panel (BMET)  . CBC w/Diff  . CUP PACEART INCLINIC DEVICE CHECK  . EKG 12-Lead     Disposition:   FU with Keishaun Hazel 1 months  Signed, Kasyn Rolph Meredith Leeds, MD  08/06/2016 11:32 AM     [facility] [address] [place] [address] [phone] (office) [phone] (fax)

## 2016-08-06 NOTE — Addendum Note (Signed)
Addended by: Stanton Kidney on: 08/06/2016 11:42 AM   Modules accepted: Orders

## 2016-08-08 ENCOUNTER — Encounter (HOSPITAL_COMMUNITY)
Admission: RE | Disposition: A | Discharge status: Home / Self Care | Payer: Self-pay | Source: Ambulatory Visit | Attending: Cardiology

## 2016-08-08 ENCOUNTER — Ambulatory Visit (HOSPITAL_COMMUNITY): Payer: Medicare Other | Admitting: Anesthesiology

## 2016-08-08 ENCOUNTER — Encounter (HOSPITAL_COMMUNITY): Payer: Self-pay | Admitting: *Deleted

## 2016-08-08 ENCOUNTER — Ambulatory Visit (HOSPITAL_COMMUNITY)
Admission: RE | Admit: 2016-08-08 | Discharge: 2016-08-08 | Disposition: A | Discharge status: Home / Self Care | Payer: Medicare Other | Source: Ambulatory Visit | Attending: Cardiology | Admitting: Cardiology

## 2016-08-08 DIAGNOSIS — Z880 Allergy status to penicillin: Secondary | ICD-10-CM | POA: Insufficient documentation

## 2016-08-08 DIAGNOSIS — I481 Persistent atrial fibrillation: Secondary | ICD-10-CM | POA: Diagnosis not present

## 2016-08-08 DIAGNOSIS — Z882 Allergy status to sulfonamides status: Secondary | ICD-10-CM | POA: Insufficient documentation

## 2016-08-08 DIAGNOSIS — Z7901 Long term (current) use of anticoagulants: Secondary | ICD-10-CM | POA: Insufficient documentation

## 2016-08-08 DIAGNOSIS — I350 Nonrheumatic aortic (valve) stenosis: Secondary | ICD-10-CM | POA: Diagnosis not present

## 2016-08-08 DIAGNOSIS — I495 Sick sinus syndrome: Secondary | ICD-10-CM | POA: Diagnosis not present

## 2016-08-08 DIAGNOSIS — Z8673 Personal history of transient ischemic attack (TIA), and cerebral infarction without residual deficits: Secondary | ICD-10-CM | POA: Diagnosis not present

## 2016-08-08 DIAGNOSIS — F419 Anxiety disorder, unspecified: Secondary | ICD-10-CM | POA: Insufficient documentation

## 2016-08-08 DIAGNOSIS — I480 Paroxysmal atrial fibrillation: Secondary | ICD-10-CM | POA: Diagnosis not present

## 2016-08-08 DIAGNOSIS — Z950 Presence of cardiac pacemaker: Secondary | ICD-10-CM | POA: Diagnosis not present

## 2016-08-08 DIAGNOSIS — I110 Hypertensive heart disease with heart failure: Secondary | ICD-10-CM | POA: Diagnosis not present

## 2016-08-08 DIAGNOSIS — K219 Gastro-esophageal reflux disease without esophagitis: Secondary | ICD-10-CM | POA: Insufficient documentation

## 2016-08-08 DIAGNOSIS — E7800 Pure hypercholesterolemia, unspecified: Secondary | ICD-10-CM | POA: Diagnosis not present

## 2016-08-08 DIAGNOSIS — Z85028 Personal history of other malignant neoplasm of stomach: Secondary | ICD-10-CM | POA: Insufficient documentation

## 2016-08-08 DIAGNOSIS — I4891 Unspecified atrial fibrillation: Secondary | ICD-10-CM | POA: Diagnosis present

## 2016-08-08 DIAGNOSIS — M1990 Unspecified osteoarthritis, unspecified site: Secondary | ICD-10-CM | POA: Insufficient documentation

## 2016-08-08 DIAGNOSIS — I4819 Other persistent atrial fibrillation: Secondary | ICD-10-CM

## 2016-08-08 DIAGNOSIS — I739 Peripheral vascular disease, unspecified: Secondary | ICD-10-CM | POA: Diagnosis not present

## 2016-08-08 DIAGNOSIS — Z8249 Family history of ischemic heart disease and other diseases of the circulatory system: Secondary | ICD-10-CM | POA: Diagnosis not present

## 2016-08-08 DIAGNOSIS — I509 Heart failure, unspecified: Secondary | ICD-10-CM | POA: Insufficient documentation

## 2016-08-08 HISTORY — PX: CARDIOVERSION: SHX1299

## 2016-08-08 SURGERY — CARDIOVERSION
Anesthesia: General

## 2016-08-08 MED ORDER — LIDOCAINE 2% (20 MG/ML) 5 ML SYRINGE
INTRAMUSCULAR | Status: AC
  Filled 2016-08-08: qty 5

## 2016-08-08 MED ORDER — FENTANYL CITRATE (PF) 100 MCG/2ML IJ SOLN: 25.0000 ug | INTRAMUSCULAR | Status: DC | PRN

## 2016-08-08 MED ORDER — LIDOCAINE 2% (20 MG/ML) 5 ML SYRINGE
INTRAMUSCULAR | Status: DC | PRN
  Administered 2016-08-08: 50 mg via INTRAVENOUS

## 2016-08-08 MED ORDER — SODIUM CHLORIDE 0.9 % IV SOLN
INTRAVENOUS | Status: DC
  Administered 2016-08-08: 11:00:00 via INTRAVENOUS

## 2016-08-08 MED ORDER — PROPOFOL 10 MG/ML IV BOLUS
INTRAVENOUS | Status: DC | PRN
  Administered 2016-08-08: 50 mg via INTRAVENOUS

## 2016-08-08 MED ORDER — ONDANSETRON HCL 4 MG/2ML IJ SOLN: 4.0000 mg | Freq: Once | INTRAMUSCULAR | Status: DC | PRN

## 2016-08-08 MED ORDER — PROPOFOL 10 MG/ML IV BOLUS
INTRAVENOUS | Status: AC
  Filled 2016-08-08: qty 20

## 2016-08-08 NOTE — CV Procedure (Signed)
   Electrical Cardioversion Procedure Note Theresa Cain [id number] [birth year]  Procedure: Electrical Cardioversion Indications:  Atrial Fibrillation  Time Out: Verified patient identification, verified procedure,medications/allergies/relevent history reviewed, required imaging and test results available.  Performed  Procedure Details  The patient was NPO after midnight. Anesthesia was administered at the beside  by Dr.Joslin with 50mg  of propofol and 20mg  of Lidocaine.  Cardioversion was done with synchronized biphasic defibrillation with AP pads with 150watts but failed to convert to NSR.   Cardioversion was done with synchronized biphasic defibrillation with AP pads with 200watts and converted the patient to AV paced rhythm.  The patient tolerated the procedure well   IMPRESSION:  Successful cardioversion of atrial fibrillation    Theresa Cain 08/08/2016, 10:52 AM

## 2016-08-08 NOTE — Anesthesia Postprocedure Evaluation (Addendum)
Anesthesia Post Note  Patient: Theresa Cain  Procedure(s) Performed: Procedure(s) (LRB): CARDIOVERSION (N/A)  Patient location during evaluation: Endoscopy Anesthesia Type: General Level of consciousness: awake, awake and alert and oriented Pain management: pain level controlled Vital Signs Assessment: post-procedure vital signs reviewed and stable Respiratory status: spontaneous breathing, nonlabored ventilation and respiratory function stable Cardiovascular status: blood pressure returned to baseline Anesthetic complications: no       Last Vitals:  Vitals:   08/08/16 1110 08/08/16 1120  BP: (!) 102/55 (!) 126/59  Pulse: (!) 47 (!) 58  Resp: 16 17  Temp:      Last Pain:  Vitals:   08/08/16 1106  TempSrc: Oral                 [name]

## 2016-08-08 NOTE — H&P (View-Only) (Signed)
Electrophysiology Office Note   Date:  08/06/2016   ID:  Theresa Cain, DOB [birth year], MRN [id number]  PCP:  Kendrick Ranch, MD  Cardiologist:  [facility] Primary Electrophysiologist:  Theresa Urenda Meredith Leeds, MD    Chief Complaint  Patient presents with  . Pacemaker Check    PAF/Tach-Brady syndrome     History of Present Illness: Theresa Cain is a 83 y.o. female who presents today for electrophysiology evaluation.   History of atrial fibrillation, carotid artery disease, hypertension, CVA, peripheral vascular disease. Presented to her primary cardiologist due to lightheadedness, vertigo, and she'll be headed feeling. She says that she was diagnosed with atrial fibrillation. 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 She was started on amiodarone at her last clinic visit due to recurrent atrial fibrillation. She remains in atrial fibrillation today. She has been in atrial fibrillation since December. She complains of quite a bit of fatigue and shortness of breath. She came into the clinic today in a wheelchair. She has not had palpitations, chest pain, PND, orthopnea, or edema. She has not had syncope or presyncope. Her main complaint is fatigue and dyspnea on exertion.    Past Medical History:  Diagnosis Date  . A-fib ([place])   . Angina at rest [facility])   . Anxiety   . Aortic valve stenosis   . Bilateral edema of lower extremity   . Bradycardia   . Carotid artery occlusion   . CHF (congestive heart failure) ([place])   . GERD (gastroesophageal reflux disease)   . Heart murmur   . High cholesterol   . Hypertension   . Obesity   . Osteoarthritis   . Presence of permanent cardiac pacemaker   . PVD (peripheral vascular disease) ([place])   . SOB (shortness of breath)   . Stomach cancer ([place]) ~ 2013  . Vertigo    Past Surgical History:  Procedure Laterality Date  . CARDIOVASCULAR STRESS TEST    . CAROTID ENDARTERECTOMY Right ~ 2005  . CATARACT  EXTRACTION W/ INTRAOCULAR LENS IMPLANT Right   . CHOLECYSTECTOMY  ~ 2013   "part of her [facility]"  . EP IMPLANTABLE DEVICE N/A 03/04/2016   Procedure: Pacemaker Implant;  Surgeon: Theresa Ramdass Meredith Leeds, MD;  Location: [place] [facility];  Service: Cardiovascular;  Laterality: N/A;  . EP IMPLANTABLE DEVICE N/A 03/05/2016   Procedure: Lead Revision/Repair;  Surgeon: Deboraha Sprang, MD;  Location: [place] [facility];  Service: Cardiovascular;  Laterality: N/A;  . INSERT / REPLACE / REMOVE PACEMAKER    . US ECHOCARDIOGRAPHY    . VAGINAL HYSTERECTOMY    . WHIPPLE PROCEDURE  ~ 2013     Current Outpatient Prescriptions  Medication Sig Dispense Refill  . acetaminophen (TYLENOL) 500 MG tablet Take 1,000 mg by mouth every 6 (six) hours as needed for moderate pain.    Marland Kitchen amiodarone (PACERONE) 200 MG tablet Take 1 tablet (200 mg total) by mouth daily. 90 tablet 0  . apixaban (ELIQUIS) 5 MG TABS tablet Take 5 mg by mouth 2 (two) times daily.    Marland Kitchen diltiazem (CARDIZEM CD) 180 MG 24 hr capsule Take 1 capsule (180 mg total) by mouth daily. 90 capsule 3  . escitalopram (LEXAPRO) 10 MG tablet Take 10 mg by mouth at bedtime.     . gabapentin (NEURONTIN) 600 MG tablet Take 1,200 mg by mouth 2 (two) times daily.     . Multiple Vitamin (MULTIVITAMIN WITH MINERALS) TABS tablet Take 1 tablet  by mouth daily.    Marland Kitchen omeprazole (PRILOSEC) 20 MG capsule Take 20 mg by mouth daily.    Marland Kitchen oxybutynin (DITROPAN-XL) 10 MG 24 hr tablet Take 10 mg by mouth at bedtime.    . pravastatin (PRAVACHOL) 40 MG tablet Take 40 mg by mouth every evening.     . torsemide (DEMADEX) 20 MG tablet Take 20 mg by mouth daily.    . Vitamin D, Ergocalciferol, (DRISDOL) 50000 units CAPS capsule Take 50,000 Units by mouth 2 (two) times a week.     No current facility-administered medications for this visit.     Allergies:   Penicillins and Sulfa antibiotics   Social History:  The patient  reports that she has never smoked. She has never used  smokeless tobacco. She reports that she does not drink alcohol or use drugs.   Family History:  The patient's family history includes Heart disease in her sister.    ROS:  Please see the history of present illness.   Otherwise, review of systems is positive for fatigue, dyspnea on exertion, back pain, dizziness.   All other systems are reviewed and negative.   PHYSICAL EXAM: VS:  BP 140/74   Pulse (!) 58   Ht 6' (1.829 m)   Wt 212 lb 3.2 oz (96.3 kg)   BMI 28.78 kg/m  , BMI Body mass index is 28.78 kg/m. GEN: Well nourished, well developed, in no acute distress  HEENT: normal  Neck: no JVD, carotid bruits, or masses Cardiac: RRR; no murmurs, rubs, or gallops,no edema  Respiratory:  clear to auscultation bilaterally, normal work of breathing GI: soft, nontender, nondistended, + BS MS: no deformity or atrophy  Skin: warm and dry, device site well healed Neuro:  Strength and sensation are intact Psych: euthymic mood, full affect   EKG:  EKG is ordered today. Personal review of the ekg ordered shows AF, LAD  Personal review of the device interrogation today. Results in [place]: 02/22/2016: B Natriuretic Peptide 390.0 03/03/2016: Hemoglobin 9.5; Platelets 255 03/05/2016: BUN 10; Creatinine, Ser 1.05; Potassium 4.3; Sodium 138    Lipid Panel  No results found for: CHOL, TRIG, HDL, CHOLHDL, VLDL, LDLCALC, LDLDIRECT   Wt Readings from Last 3 Encounters:  08/06/16 212 lb 3.2 oz (96.3 kg)  06/19/16 206 lb (93.4 kg)  03/06/16 202 lb 2.6 oz (91.7 kg)      Other studies Reviewed: Additional studies/ records that were reviewed today include: 12/31/15 TTE Review of the above records today demonstrates:  LVEF>55%, Moderate aortic stenosis, peak gradient 57.8 mmHg (see care everywhere)   ASSESSMENT AND PLAN:  1.  Persistent atrial fibrillation: Currently anticoagulated on Eliquis. Has been loaded on amiodarone. Does remain in atrial fibrillation today despite  amiodarone load. She likely Almer [place] need cardioversion to return to sinus rhythm. Risks and benefits were discussed. She understands the risks and has agreed to the procedure.  This patients CHA2DS2-VASc Score and unadjusted Ischemic Stroke Rate (% per year) is equal to 7.2 % stroke rate/year from a score of 5  Above score calculated as 1 point each if present [CHF, HTN, DM, Vascular=MI/PAD/Aortic Plaque, Age if 65-74, or Female] Above score calculated as 2 points each if present [Age > 75, or Stroke/TIA/TE]   2. Tachy-brady syndrome: Medtronic dual chamber pacemaker placed 1111/03/10. Functioning properly with no changes made today.  3. Aortic stenosis: Found to be moderate on her previous echo. Should she not improve after cardioversion, she may benefit from an  echo and evaluation for valve replacement.  Current medicines are reviewed at length with the patient today.   The patient does not have concerns regarding her medicines.  The following changes were made today:  none  Labs/ tests ordered today include:  Orders Placed This Encounter  Procedures  . Basic Metabolic Panel (BMET)  . CBC w/Diff  . CUP PACEART INCLINIC DEVICE CHECK  . EKG 12-Lead     Disposition:   FU with Chaniqua Brisby 1 months  Signed, Theresa Dubs Meredith Leeds, MD  08/06/2016 11:32 AM     [facility] [address] [place] [address] [phone] (office) [phone] (fax)

## 2016-08-08 NOTE — Discharge Instructions (Signed)
Electrical Cardioversion, Care After °This sheet gives you information about how to care for yourself after your procedure. Your health care provider may also give you more specific instructions. If you have problems or questions, contact your health care provider. °What can I expect after the procedure? °After the procedure, it is common to have: °· Some redness on the skin where the shocks were given. °Follow these instructions at home: °· Do not drive for 24 hours if you were given a medicine to help you relax (sedative). °· Take over-the-counter and prescription medicines only as told by your health care provider. °· Ask your health care provider how to check your pulse. Check it often. °· Rest for 48 hours after the procedure or as told by your health care provider. °· Avoid or limit your caffeine use as told by your health care provider. °Contact a health care provider if: °· You feel like your heart is beating too quickly or your pulse is not regular. °· You have a serious muscle cramp that does not go away. °Get help right away if: °· You have discomfort in your chest. °· You are dizzy or you feel faint. °· You have trouble breathing or you are short of breath. °· Your speech is slurred. °· You have trouble moving an arm or leg on one side of your body. °· Your fingers or toes turn cold or blue. °This information is not intended to replace advice given to you by your health care provider. Make sure you discuss any questions you have with your health care provider. °Document Released: 12/29/2012 Document Revised: 10/12/2015 Document Reviewed: 09/14/2015 °Elsevier Interactive Patient Education © 2017 [employer] ° °

## 2016-08-08 NOTE — Interval H&P Note (Signed)
History and Physical Interval Note:  08/08/2016 10:51 AM  Theresa Cain  has presented today for surgery, with the diagnosis of AFIB  The various methods of treatment have been discussed with the patient and family. After consideration of risks, benefits and other options for treatment, the patient has consented to  Procedure(s): CARDIOVERSION (N/A) as a surgical intervention .  The patient's history has been reviewed, patient examined, no change in status, stable for surgery.  I have reviewed the patient's chart and labs.  Questions were answered to the patient's satisfaction.     Fransico Him

## 2016-08-08 NOTE — Transfer of Care (Signed)
Immediate Anesthesia Transfer of Care Note  Patient: Theresa Cain  Procedure(s) Performed: Procedure(s): CARDIOVERSION (N/A)  Patient Location: PACU and Endoscopy Unit  Anesthesia Type:MAC  Level of Consciousness: awake, alert , oriented and patient cooperative  Airway & Oxygen Therapy: Patient Spontanous Breathing and Patient connected to nasal cannula oxygen  Post-op Assessment: Report given to RN, Post -op Vital signs reviewed and stable and Patient moving all extremities  Post vital signs: Reviewed and stable  Last Vitals:  Vitals:   08/08/16 1100 08/08/16 1101  BP:    Pulse: (!) 59 (!) 59  Resp: (!) 26 (!) 23  Temp:      Last Pain:  Vitals:   08/08/16 1019  TempSrc: Oral         Complications: No apparent anesthesia complications

## 2016-08-08 NOTE — Anesthesia Preprocedure Evaluation (Addendum)
Anesthesia Evaluation  Patient identified by MRN, date of birth, ID band Patient awake    Reviewed: Allergy & Precautions, NPO status , Patient's Chart, lab work & pertinent test results  Airway Mallampati: II  TM Distance: >3 FB Neck ROM: Full    Dental  (+) Teeth Intact   Pulmonary    breath sounds clear to auscultation       Cardiovascular hypertension,  Rhythm:Irregular Rate:Normal     Neuro/Psych    GI/Hepatic   Endo/Other    Renal/GU      Musculoskeletal   Abdominal   Peds  Hematology   Anesthesia Other Findings   Reproductive/Obstetrics                            Anesthesia Physical Anesthesia Plan  ASA: III  Anesthesia Plan: General   Post-op Pain Management:    Induction: Intravenous  Airway Management Planned: Mask  Additional Equipment:   Intra-op Plan:   Post-operative Plan:   Informed Consent: I have reviewed the patients History and Physical, chart, labs and discussed the procedure including the risks, benefits and alternatives for the proposed anesthesia with the patient or authorized representative who has indicated his/her understanding and acceptance.     Plan Discussed with: CRNA and Anesthesiologist  Anesthesia Plan Comments:        Anesthesia Quick Evaluation

## 2016-08-09 ENCOUNTER — Encounter (HOSPITAL_COMMUNITY): Payer: Self-pay | Admitting: Cardiology

## 2016-08-20 ENCOUNTER — Encounter: Payer: Self-pay | Admitting: Cardiology

## 2016-09-02 ENCOUNTER — Encounter (INDEPENDENT_AMBULATORY_CARE_PROVIDER_SITE_OTHER): Payer: Self-pay

## 2016-09-02 ENCOUNTER — Encounter: Payer: Self-pay | Admitting: Cardiology

## 2016-09-02 ENCOUNTER — Ambulatory Visit (INDEPENDENT_AMBULATORY_CARE_PROVIDER_SITE_OTHER): Payer: Medicare Other | Admitting: Cardiology

## 2016-09-02 VITALS — BP 136/68 | HR 78 | Ht 72.0 in | Wt 213.8 lb

## 2016-09-02 DIAGNOSIS — I4819 Other persistent atrial fibrillation: Secondary | ICD-10-CM

## 2016-09-02 DIAGNOSIS — I495 Sick sinus syndrome: Secondary | ICD-10-CM

## 2016-09-02 DIAGNOSIS — Z45018 Encounter for adjustment and management of other part of cardiac pacemaker: Secondary | ICD-10-CM | POA: Diagnosis not present

## 2016-09-02 DIAGNOSIS — I481 Persistent atrial fibrillation: Secondary | ICD-10-CM

## 2016-09-02 NOTE — Progress Notes (Signed)
Electrophysiology Office Note   Date:  09/02/2016   ID:  Theresa Cain, DOB June 05, 1926, MRN [id number]  PCP:  Kendrick Ranch, MD  Cardiologist:  [facility] Primary Electrophysiologist:  Nollan Muldrow Meredith Leeds, MD    Chief Complaint  Patient presents with  . Pacemaker Check    Persistent Afib/tacy-brady syndrome     History of Present Illness: Theresa Cain is a 83 y.o. female who presents today for electrophysiology evaluation.   History of atrial fibrillation, carotid artery disease, hypertension, CVA, peripheral vascular disease. Presented to her primary cardiologist due to lightheadedness, vertigo, and she'll be headed feeling. She says that she was diagnosed with atrial fibrillation. 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 She had a cardioversion on 08/08/16. She returned to atrial fibrillation fairly rapidly. Today, she says she feels well. She's been able to work in her garden without issue. She does not have much in the way of chest pain, dyspnea on exertion.  Today, denies symptoms of palpitations, chest pain, orthopnea, PND, lower extremity edema, claudication, dizziness, presyncope, syncope, bleeding, or neurologic sequela. The patient is tolerating medications without difficulties and is otherwise without complaint today.    Past Medical History:  Diagnosis Date  . A-fib ([place])   . Angina at rest [facility])   . Anxiety   . Aortic valve stenosis   . Bilateral edema of lower extremity   . Bradycardia   . Carotid artery occlusion   . CHF (congestive heart failure) ([place])   . GERD (gastroesophageal reflux disease)   . Heart murmur   . High cholesterol   . Hypertension   . Obesity   . Osteoarthritis   . Presence of permanent cardiac pacemaker   . PVD (peripheral vascular disease) ([place])   . SOB (shortness of breath)   . Stomach cancer ([place]) ~ 2013  . Vertigo    Past Surgical History:  Procedure Laterality Date  . CARDIOVASCULAR STRESS  TEST    . CARDIOVERSION N/A 08/08/2016   Procedure: CARDIOVERSION;  Surgeon: Sueanne Margarita, MD;  Location: [facility] ENDOSCOPY;  Service: Cardiovascular;  Laterality: N/A;  . CAROTID ENDARTERECTOMY Right ~ 2005  . CATARACT EXTRACTION W/ INTRAOCULAR LENS IMPLANT Right   . CHOLECYSTECTOMY  ~ 2013   "part of her [facility] OR"  . EP IMPLANTABLE DEVICE N/A 03/04/2016   Procedure: Pacemaker Implant;  Surgeon: Oswell Say Meredith Leeds, MD;  Location: [place] CV LAB;  Service: Cardiovascular;  Laterality: N/A;  . EP IMPLANTABLE DEVICE N/A 03/05/2016   Procedure: Lead Revision/Repair;  Surgeon: Deboraha Sprang, MD;  Location: [place] CV LAB;  Service: Cardiovascular;  Laterality: N/A;  . INSERT / REPLACE / REMOVE PACEMAKER    . US ECHOCARDIOGRAPHY    . VAGINAL HYSTERECTOMY    . WHIPPLE PROCEDURE  ~ 2013     Current Outpatient Prescriptions  Medication Sig Dispense Refill  . acetaminophen (TYLENOL) 500 MG tablet Take 1,000 mg by mouth every 6 (six) hours as needed for moderate pain.    Marland Kitchen amiodarone (PACERONE) 200 MG tablet Take 1 tablet (200 mg total) by mouth daily. 90 tablet 2  . apixaban (ELIQUIS) 5 MG TABS tablet Take 5 mg by mouth 2 (two) times daily.    Marland Kitchen diltiazem (CARDIZEM CD) 180 MG 24 hr capsule Take 1 capsule (180 mg total) by mouth daily. 90 capsule 3  . escitalopram (LEXAPRO) 10 MG tablet Take 10 mg by mouth at bedtime.     . gabapentin (NEURONTIN) 600  MG tablet Take 1,200 mg by mouth 2 (two) times daily.     . Multiple Vitamin (MULTIVITAMIN WITH MINERALS) TABS tablet Take 1 tablet by mouth daily.    Marland Kitchen omeprazole (PRILOSEC) 20 MG capsule Take 20 mg by mouth daily.    Marland Kitchen oxybutynin (DITROPAN-XL) 10 MG 24 hr tablet Take 10 mg by mouth at bedtime.    . pravastatin (PRAVACHOL) 40 MG tablet Take 40 mg by mouth every evening.     . torsemide (DEMADEX) 20 MG tablet Take 20 mg by mouth daily.    . Vitamin D, Ergocalciferol, (DRISDOL) 50000 units CAPS capsule Take 50,000 Units by mouth 2 (two)  times a week.     No current facility-administered medications for this visit.     Allergies:   Penicillins and Sulfa antibiotics   Social History:  The patient  reports that she has never smoked. She has never used smokeless tobacco. She reports that she does not drink alcohol or use drugs.   Family History:  The patient's family history includes Heart disease in her sister.    ROS:  Please see the history of present illness.   Otherwise, review of systems is positive for dyspnea on exertion, balance problems, dizziness.   All other systems are reviewed and negative.     PHYSICAL EXAM: VS:  BP 136/68   Pulse 78   Ht 6' (1.829 m)   Wt 213 lb 12.8 oz (97 kg)   BMI 29.00 kg/m  , BMI Body mass index is 29 kg/m. GEN: Well nourished, well developed, in no acute distress  HEENT: normal  Neck: no JVD, carotid bruits, or masses Cardiac: RRR; no murmurs, rubs, or gallops,no edema  Respiratory:  clear to auscultation bilaterally, normal work of breathing GI: soft, nontender, nondistended, + BS MS: no deformity or atrophy  Skin: warm and dry, device site well healed Neuro:  Strength and sensation are intact Psych: euthymic mood, full affect  EKG:  EKG is ordered today. Personal review of the ekg ordered shows atrial fibrillation, V pacing   Personal review of the device interrogation today. Results in [place]: 02/22/2016: B Natriuretic Peptide 390.0 08/06/2016: BUN 19; Creatinine, Ser 1.52; Hemoglobin 8.6; Platelets 170; Potassium 4.6; Sodium 140    Lipid Panel  No results found for: CHOL, TRIG, HDL, CHOLHDL, VLDL, LDLCALC, LDLDIRECT   Wt Readings from Last 3 Encounters:  09/02/16 213 lb 12.8 oz (97 kg)  08/08/16 207 lb (93.9 kg)  08/06/16 212 lb 3.2 oz (96.3 kg)      Other studies Reviewed: Additional studies/ records that were reviewed today include: 12/31/15 TTE Review of the above records today demonstrates:  LVEF>55%, Moderate aortic stenosis, peak  gradient 57.8 mmHg (see care everywhere)   ASSESSMENT AND PLAN:  1.  Persistent atrial fibrillation: Currently on amiodarone, Eliquis, and diltiazem. She was cardioverted approximately one month ago after an amiodarone load. She appears to be back in atrial fibrillation. Fortunately she does not have symptoms from her atrial fibrillation. We'll continue current management without changes.  This patients CHA2DS2-VASc Score and unadjusted Ischemic Stroke Rate (% per year) is equal to 7.2 % stroke rate/year from a score of 5  Above score calculated as 1 point each if present [CHF, HTN, DM, Vascular=MI/PAD/Aortic Plaque, Age if 65-74, or Female] Above score calculated as 2 points each if present [Age > 75, or Stroke/TIA/TE]   2. Tachy-brady syndrome: Medtronic dual chamber pacemaker placed 12/17. Functioning appropriately. No changes  made today.  3. Aortic stenosis: Unchanged on exam today. No chest pain shortness of breath, syncope. Continue monitoring.  Current medicines are reviewed at length with the patient today.   The patient does not have concerns regarding her medicines.  The following changes were made today:    Labs/ tests ordered today include:  Orders Placed This Encounter  Procedures  . EKG 12-Lead     Disposition:   FU with Zali Kamaka 6 months  Signed, Kavian Peters Meredith Leeds, MD  09/02/2016 4:07 PM     [place] [address] [place] [address] [phone] (office) [phone] (fax)

## 2016-09-02 NOTE — Patient Instructions (Signed)
Medication Instructions:    Your physician recommends that you continue on your current medications as directed. Please refer to the Current Medication list given to you today.  --- If you need a refill on your cardiac medications before your next appointment, please call your pharmacy. ---  Labwork:  None ordered  Testing/Procedures:  None ordered  Follow-Up: Remote monitoring is used to monitor your Pacemaker of ICD from home. This monitoring reduces the number of office visits required to check your device to one time per year. It allows [place] to keep an eye on the functioning of your device to ensure it is working properly. You are scheduled for a device check from home on 12/02/2016. You may send your transmission at any time that day. If you have a wireless device, the transmission will be sent automatically. After your physician reviews your transmission, you will receive a postcard with your next transmission date.   Your physician wants you to follow-up in: 6 months with Dr. Curt Bears.  You will receive a reminder letter in the mail two months in advance. If you don't receive a letter, please call our office to schedule the follow-up appointment.  Thank you for choosing [facility]!!   Trinidad Curet, RN [phone]

## 2016-09-03 LAB — CUP PACEART INCLINIC DEVICE CHECK
Brady Statistic AS VP Percent: 94.92 %
Brady Statistic AS VS Percent: 4.35 %
Date Time Interrogation Session: 20180612194929
Implantable Lead Location: 753860
Lead Channel Pacing Threshold Amplitude: 0.625 V
Lead Channel Pacing Threshold Pulse Width: 0.4 ms
Lead Channel Sensing Intrinsic Amplitude: 1.125 mV
Lead Channel Sensing Intrinsic Amplitude: 12.875 mV
Lead Channel Setting Pacing Amplitude: 2.5 V
Lead Channel Setting Pacing Pulse Width: 0.4 ms
Lead Channel Setting Sensing Sensitivity: 2.8 mV
MDC IDC LEAD IMPLANT DT: 20171212
MDC IDC LEAD IMPLANT DT: 20171212
MDC IDC LEAD LOCATION: 753859
MDC IDC MSMT BATTERY REMAINING LONGEVITY: 99 mo
MDC IDC MSMT BATTERY VOLTAGE: 3.02 V
MDC IDC MSMT LEADCHNL RA IMPEDANCE VALUE: 304 Ohm
MDC IDC MSMT LEADCHNL RA IMPEDANCE VALUE: 380 Ohm
MDC IDC MSMT LEADCHNL RA SENSING INTR AMPL: 1.25 mV
MDC IDC MSMT LEADCHNL RV IMPEDANCE VALUE: 380 Ohm
MDC IDC MSMT LEADCHNL RV IMPEDANCE VALUE: 494 Ohm
MDC IDC MSMT LEADCHNL RV SENSING INTR AMPL: 12 mV
MDC IDC PG IMPLANT DT: 20171212
MDC IDC SET LEADCHNL RA PACING AMPLITUDE: 2 V
MDC IDC STAT BRADY AP VP PERCENT: 0.28 %
MDC IDC STAT BRADY AP VS PERCENT: 0.45 %
MDC IDC STAT BRADY RA PERCENT PACED: 0.6 %
MDC IDC STAT BRADY RV PERCENT PACED: 95.24 %

## 2016-11-13 NOTE — Addendum Note (Signed)
Addendum  created 11/13/16 1447 by Roberts Gaudy, MD   Sign clinical note

## 2016-12-02 ENCOUNTER — Ambulatory Visit (INDEPENDENT_AMBULATORY_CARE_PROVIDER_SITE_OTHER): Payer: Medicare Other | Admitting: *Deleted

## 2016-12-02 ENCOUNTER — Telehealth: Payer: Self-pay | Admitting: Cardiology

## 2016-12-02 DIAGNOSIS — I495 Sick sinus syndrome: Secondary | ICD-10-CM | POA: Diagnosis not present

## 2016-12-02 NOTE — Telephone Encounter (Signed)
LMOVM reminding pt to send remote transmission.   

## 2016-12-03 ENCOUNTER — Encounter: Payer: Self-pay | Admitting: Cardiology

## 2016-12-03 LAB — CUP PACEART REMOTE DEVICE CHECK
Battery Voltage: 3.01 V
Brady Statistic AP VP Percent: 0.21 %
Brady Statistic AS VP Percent: 94.9 %
Brady Statistic RA Percent Paced: 0.18 %
Brady Statistic RV Percent Paced: 95.17 %
Date Time Interrogation Session: 20180912131550
Implantable Lead Implant Date: 20171212
Implantable Lead Location: 753860
Implantable Lead Model: 5076
Lead Channel Impedance Value: 437 Ohm
Lead Channel Impedance Value: 513 Ohm
Lead Channel Pacing Threshold Pulse Width: 0.4 ms
Lead Channel Sensing Intrinsic Amplitude: 1.125 mV
Lead Channel Sensing Intrinsic Amplitude: 1.125 mV
Lead Channel Sensing Intrinsic Amplitude: 15.25 mV
Lead Channel Sensing Intrinsic Amplitude: 15.25 mV
Lead Channel Setting Pacing Amplitude: 2.5 V
Lead Channel Setting Sensing Sensitivity: 2.8 mV
MDC IDC LEAD IMPLANT DT: 20171212
MDC IDC LEAD LOCATION: 753859
MDC IDC MSMT BATTERY REMAINING LONGEVITY: 85 mo
MDC IDC MSMT LEADCHNL RA IMPEDANCE VALUE: 323 Ohm
MDC IDC MSMT LEADCHNL RV IMPEDANCE VALUE: 399 Ohm
MDC IDC MSMT LEADCHNL RV PACING THRESHOLD AMPLITUDE: 0.625 V
MDC IDC PG IMPLANT DT: 20171212
MDC IDC SET LEADCHNL RA PACING AMPLITUDE: 2 V
MDC IDC SET LEADCHNL RV PACING PULSEWIDTH: 0.4 ms
MDC IDC STAT BRADY AP VS PERCENT: 0 %
MDC IDC STAT BRADY AS VS PERCENT: 4.88 %

## 2016-12-03 NOTE — Progress Notes (Signed)
Remote pacemaker transmission.   

## 2016-12-04 ENCOUNTER — Telehealth: Payer: Self-pay

## 2016-12-04 ENCOUNTER — Other Ambulatory Visit: Payer: Self-pay

## 2016-12-04 NOTE — Telephone Encounter (Signed)
Spoke with patient's daughter and explained that Dr. Shonna Chock ordered her amiodarone to be discontinued. Patients daughter confirmed medication and verbalized understanding.

## 2017-03-02 ENCOUNTER — Encounter: Payer: Medicare Other | Admitting: Cardiology

## 2017-03-03 ENCOUNTER — Ambulatory Visit (INDEPENDENT_AMBULATORY_CARE_PROVIDER_SITE_OTHER): Payer: Medicare Other | Admitting: *Deleted

## 2017-03-03 DIAGNOSIS — I495 Sick sinus syndrome: Secondary | ICD-10-CM | POA: Diagnosis not present

## 2017-03-03 NOTE — Progress Notes (Signed)
Remote pacemaker transmission.   

## 2017-03-05 LAB — CUP PACEART REMOTE DEVICE CHECK
Battery Remaining Longevity: 86 mo
Battery Voltage: 3.01 V
Brady Statistic AP VS Percent: 0 %
Brady Statistic AS VS Percent: 8.46 %
Brady Statistic RV Percent Paced: 91.62 %
Implantable Lead Implant Date: 20171212
Implantable Lead Location: 753859
Implantable Lead Model: 5076
Implantable Lead Model: 5076
Implantable Pulse Generator Implant Date: 20171212
Lead Channel Impedance Value: 285 Ohm
Lead Channel Impedance Value: 380 Ohm
Lead Channel Impedance Value: 418 Ohm
Lead Channel Impedance Value: 494 Ohm
Lead Channel Sensing Intrinsic Amplitude: 1.375 mV
Lead Channel Sensing Intrinsic Amplitude: 11.75 mV
Lead Channel Setting Pacing Amplitude: 2 V
MDC IDC LEAD IMPLANT DT: 20171212
MDC IDC LEAD LOCATION: 753860
MDC IDC MSMT LEADCHNL RA SENSING INTR AMPL: 1.375 mV
MDC IDC MSMT LEADCHNL RV PACING THRESHOLD AMPLITUDE: 0.625 V
MDC IDC MSMT LEADCHNL RV PACING THRESHOLD PULSEWIDTH: 0.4 ms
MDC IDC MSMT LEADCHNL RV SENSING INTR AMPL: 11.75 mV
MDC IDC SESS DTM: 20181211155748
MDC IDC SET LEADCHNL RV PACING AMPLITUDE: 2.5 V
MDC IDC SET LEADCHNL RV PACING PULSEWIDTH: 0.4 ms
MDC IDC SET LEADCHNL RV SENSING SENSITIVITY: 2.8 mV
MDC IDC STAT BRADY AP VP PERCENT: 0.03 %
MDC IDC STAT BRADY AS VP PERCENT: 91.51 %
MDC IDC STAT BRADY RA PERCENT PACED: 0.02 %

## 2017-03-06 ENCOUNTER — Encounter: Payer: Self-pay | Admitting: Cardiology

## 2017-03-23 ENCOUNTER — Other Ambulatory Visit: Payer: Self-pay

## 2017-03-23 ENCOUNTER — Inpatient Hospital Stay
Admission: EM | Admit: 2017-03-23 | Discharge: 2017-03-27 | DRG: 287 | Disposition: A | Discharge status: Home / Self Care | Payer: Medicare Other | Attending: Internal Medicine | Admitting: Internal Medicine

## 2017-03-23 ENCOUNTER — Encounter: Payer: Self-pay | Admitting: *Deleted

## 2017-03-23 ENCOUNTER — Emergency Department: Payer: Medicare Other

## 2017-03-23 DIAGNOSIS — Z79899 Other long term (current) drug therapy: Secondary | ICD-10-CM

## 2017-03-23 DIAGNOSIS — Z882 Allergy status to sulfonamides status: Secondary | ICD-10-CM

## 2017-03-23 DIAGNOSIS — R946 Abnormal results of thyroid function studies: Secondary | ICD-10-CM | POA: Diagnosis present

## 2017-03-23 DIAGNOSIS — I4581 Long QT syndrome: Secondary | ICD-10-CM | POA: Diagnosis present

## 2017-03-23 DIAGNOSIS — Z880 Allergy status to penicillin: Secondary | ICD-10-CM

## 2017-03-23 DIAGNOSIS — Z950 Presence of cardiac pacemaker: Secondary | ICD-10-CM

## 2017-03-23 DIAGNOSIS — I10 Essential (primary) hypertension: Secondary | ICD-10-CM | POA: Diagnosis present

## 2017-03-23 DIAGNOSIS — I208 Other forms of angina pectoris: Secondary | ICD-10-CM | POA: Diagnosis present

## 2017-03-23 DIAGNOSIS — I4821 Permanent atrial fibrillation: Secondary | ICD-10-CM | POA: Diagnosis present

## 2017-03-23 DIAGNOSIS — I350 Nonrheumatic aortic (valve) stenosis: Secondary | ICD-10-CM | POA: Diagnosis present

## 2017-03-23 DIAGNOSIS — N179 Acute kidney failure, unspecified: Secondary | ICD-10-CM | POA: Diagnosis present

## 2017-03-23 DIAGNOSIS — I4721 Torsades de pointes: Secondary | ICD-10-CM

## 2017-03-23 DIAGNOSIS — I25118 Atherosclerotic heart disease of native coronary artery with other forms of angina pectoris: Principal | ICD-10-CM | POA: Diagnosis present

## 2017-03-23 DIAGNOSIS — R0602 Shortness of breath: Secondary | ICD-10-CM | POA: Diagnosis not present

## 2017-03-23 DIAGNOSIS — I739 Peripheral vascular disease, unspecified: Secondary | ICD-10-CM | POA: Diagnosis present

## 2017-03-23 DIAGNOSIS — I482 Chronic atrial fibrillation: Secondary | ICD-10-CM | POA: Diagnosis present

## 2017-03-23 DIAGNOSIS — I110 Hypertensive heart disease with heart failure: Secondary | ICD-10-CM | POA: Diagnosis present

## 2017-03-23 DIAGNOSIS — I4892 Unspecified atrial flutter: Secondary | ICD-10-CM | POA: Diagnosis present

## 2017-03-23 DIAGNOSIS — I6529 Occlusion and stenosis of unspecified carotid artery: Secondary | ICD-10-CM | POA: Diagnosis present

## 2017-03-23 DIAGNOSIS — R531 Weakness: Secondary | ICD-10-CM | POA: Diagnosis present

## 2017-03-23 DIAGNOSIS — E785 Hyperlipidemia, unspecified: Secondary | ICD-10-CM | POA: Diagnosis present

## 2017-03-23 DIAGNOSIS — E876 Hypokalemia: Secondary | ICD-10-CM | POA: Diagnosis present

## 2017-03-23 DIAGNOSIS — K219 Gastro-esophageal reflux disease without esophagitis: Secondary | ICD-10-CM | POA: Diagnosis present

## 2017-03-23 DIAGNOSIS — I472 Ventricular tachycardia: Secondary | ICD-10-CM

## 2017-03-23 DIAGNOSIS — Z7901 Long term (current) use of anticoagulants: Secondary | ICD-10-CM

## 2017-03-23 DIAGNOSIS — F419 Anxiety disorder, unspecified: Secondary | ICD-10-CM | POA: Diagnosis present

## 2017-03-23 DIAGNOSIS — I5032 Chronic diastolic (congestive) heart failure: Secondary | ICD-10-CM | POA: Diagnosis present

## 2017-03-23 DIAGNOSIS — M1990 Unspecified osteoarthritis, unspecified site: Secondary | ICD-10-CM | POA: Diagnosis present

## 2017-03-23 LAB — BASIC METABOLIC PANEL
ANION GAP: 11 (ref 5–15)
BUN: 10 mg/dL (ref 6–20)
CO2: 27 mmol/L (ref 22–32)
Calcium: 8.6 mg/dL — ABNORMAL LOW (ref 8.9–10.3)
Chloride: 102 mmol/L (ref 101–111)
Creatinine, Ser: 1.06 mg/dL — ABNORMAL HIGH (ref 0.44–1.00)
GFR calc Af Amer: 53 mL/min — ABNORMAL LOW (ref >60)
GFR, EST NON AFRICAN AMERICAN: 45 mL/min — AB (ref >60)
GLUCOSE: 134 mg/dL — AB (ref 65–99)
POTASSIUM: 3 mmol/L — AB (ref 3.5–5.1)
Sodium: 140 mmol/L (ref 135–145)

## 2017-03-23 LAB — CBC
HEMATOCRIT: 36.1 % (ref 35.0–47.0)
HEMOGLOBIN: 12.3 g/dL (ref 12.0–16.0)
MCH: 29.9 pg (ref 26.0–34.0)
MCHC: 34.2 g/dL (ref 32.0–36.0)
MCV: 87.5 fL (ref 80.0–100.0)
Platelets: 151 10*3/uL (ref 150–440)
RBC: 4.13 MIL/uL (ref 3.80–5.20)
RDW: 14.9 % — ABNORMAL HIGH (ref 11.5–14.5)
WBC: 7.3 10*3/uL (ref 3.6–11.0)

## 2017-03-23 LAB — URINALYSIS, COMPLETE (UACMP) WITH MICROSCOPIC
Bacteria, UA: NONE SEEN
Bilirubin Urine: NEGATIVE
GLUCOSE, UA: NEGATIVE mg/dL
Hgb urine dipstick: NEGATIVE
Ketones, ur: NEGATIVE mg/dL
Leukocytes, UA: NEGATIVE
Nitrite: NEGATIVE
PH: 8 (ref 5.0–8.0)
PROTEIN: NEGATIVE mg/dL
SPECIFIC GRAVITY, URINE: 1.006 (ref 1.005–1.030)

## 2017-03-23 LAB — BRAIN NATRIURETIC PEPTIDE: B NATRIURETIC PEPTIDE 5: 333 pg/mL — AB (ref 0.0–100.0)

## 2017-03-23 LAB — TROPONIN I: Troponin I: <0.03 ng/mL (ref <0.03)

## 2017-03-23 MED ORDER — ASPIRIN 81 MG PO CHEW
324.0000 mg | CHEWABLE_TABLET | Freq: Once | ORAL | Status: AC
  Administered 2017-03-23: 324 mg via ORAL
  Filled 2017-03-23: qty 4

## 2017-03-23 NOTE — H&P (Signed)
[facility] [place] NAME: Theresa Cain    MR#:  [id number]  DATE OF BIRTH:  1927/02/20  DATE OF ADMISSION:  03/23/2017  PRIMARY CARE PHYSICIAN: Vasireddy, Lanetta Inch, MD   REQUESTING/REFERRING PHYSICIAN: Mable Paris, MD  CHIEF COMPLAINT:   Chief Complaint  Patient presents with  . Weakness  . Shortness of Breath    HISTORY OF PRESENT ILLNESS:  Theresa Cain  is a 83 y.o. female who presents with a couple days of increasing weakness and episodes of diaphoresis and shortness of breath.  Patient has a pacemaker, and in the ED had some EKG changes.  These changes were somewhat nonspecific, though did involve some T wave changes from one EKG the next.  Given that her symptoms are consistent with anginal equivalent and her EKG changes, hospitalist were called for admission and further evaluation.  PAST MEDICAL HISTORY:   Past Medical History:  Diagnosis Date  . A-fib ([place])   . Angina at rest [facility])   . Anxiety   . Aortic valve stenosis   . Bilateral edema of lower extremity   . Bradycardia   . Carotid artery occlusion   . CHF (congestive heart failure) ([place])   . GERD (gastroesophageal reflux disease)   . Heart murmur   . High cholesterol   . Hypertension   . Obesity   . Osteoarthritis   . Presence of permanent cardiac pacemaker   . PVD (peripheral vascular disease) ([place])   . SOB (shortness of breath)   . Stomach cancer ([place]) ~ 2013  . Vertigo     PAST SURGICAL HISTORY:   Past Surgical History:  Procedure Laterality Date  . CARDIOVASCULAR STRESS TEST    . CARDIOVERSION N/A 08/08/2016   Procedure: CARDIOVERSION;  Surgeon: Sueanne Margarita, MD;  Location: [facility] ENDOSCOPY;  Service: Cardiovascular;  Laterality: N/A;  . CAROTID ENDARTERECTOMY Right ~ 2005  . CATARACT EXTRACTION W/ INTRAOCULAR LENS IMPLANT Right   . CHOLECYSTECTOMY  ~ 2013   "part of her [facility] OR"  . EP IMPLANTABLE DEVICE N/A 03/04/2016   Procedure:  Pacemaker Implant;  Surgeon: Will Meredith Leeds, MD;  Location: [place] CV LAB;  Service: Cardiovascular;  Laterality: N/A;  . EP IMPLANTABLE DEVICE N/A 03/05/2016   Procedure: Lead Revision/Repair;  Surgeon: Deboraha Sprang, MD;  Location: [place] CV LAB;  Service: Cardiovascular;  Laterality: N/A;  . INSERT / REPLACE / REMOVE PACEMAKER    . US ECHOCARDIOGRAPHY    . VAGINAL HYSTERECTOMY    . WHIPPLE PROCEDURE  ~ 2013    SOCIAL HISTORY:   Social History   Tobacco Use  . Smoking status: Never Smoker  . Smokeless tobacco: Never Used  Substance Use Topics  . Alcohol use: No    FAMILY HISTORY:   Family History  Problem Relation Age of Onset  . Heart disease Sister     DRUG ALLERGIES:   Allergies  Allergen Reactions  . Penicillins Hives  . Sulfa Antibiotics Hives    MEDICATIONS AT HOME:   Prior to Admission medications   Medication Sig Start Date End Date Taking? Authorizing Provider  acetaminophen (TYLENOL) 500 MG tablet Take 1,000 mg by mouth every 6 (six) hours as needed for moderate pain.    [provider]  apixaban (ELIQUIS) 5 MG TABS tablet Take 5 mg by mouth 2 (two) times daily.    [provider]  diltiazem (CARDIZEM CD) 180 MG 24 hr capsule Take 1 capsule (  180 mg total) by mouth daily. 05/20/16   [place], Will Hassell Done, MD  escitalopram (LEXAPRO) 10 MG tablet Take 10 mg by mouth at bedtime.     [provider]  gabapentin (NEURONTIN) 600 MG tablet Take 1,200 mg by mouth 2 (two) times daily.     [provider]  Multiple Vitamin (MULTIVITAMIN WITH MINERALS) TABS tablet Take 1 tablet by mouth daily.    [provider]  omeprazole (PRILOSEC) 20 MG capsule Take 20 mg by mouth daily.    [provider]  oxybutynin (DITROPAN-XL) 10 MG 24 hr tablet Take 10 mg by mouth at bedtime.    [provider]  pravastatin (PRAVACHOL) 40 MG tablet Take 40 mg by mouth every evening.     [provider]   torsemide (DEMADEX) 20 MG tablet Take 20 mg by mouth daily.    [provider]  Vitamin D, Ergocalciferol, (DRISDOL) 50000 units CAPS capsule Take 50,000 Units by mouth 2 (two) times a week.    [provider]    REVIEW OF SYSTEMS:  Review of Systems  Constitutional: Positive for diaphoresis and malaise/fatigue. Negative for chills, fever and weight loss.  HENT: Negative for ear pain, hearing loss and tinnitus.   Eyes: Negative for blurred vision, double vision, pain and redness.  Respiratory: Positive for shortness of breath. Negative for cough and hemoptysis.   Cardiovascular: Negative for chest pain, palpitations, orthopnea and leg swelling.  Gastrointestinal: Negative for abdominal pain, constipation, diarrhea, nausea and vomiting.  Genitourinary: Negative for dysuria, frequency and hematuria.  Musculoskeletal: Negative for back pain, joint pain and neck pain.  Skin:       No acne, rash, or lesions  Neurological: Positive for weakness. Negative for dizziness, tremors and focal weakness.  Endo/Heme/Allergies: Negative for polydipsia. Does not bruise/bleed easily.  Psychiatric/Behavioral: Negative for depression. The patient is not nervous/anxious and does not have insomnia.      VITAL SIGNS:   Vitals:   03/23/17 2115 03/23/17 2130 03/23/17 2145 03/23/17 2152  BP:    (!) 179/89  Pulse: 61 (!) 59 64 (!) 59  Resp: (!) 21 (!) 22 17 14   Temp:      TempSrc:      SpO2: 98% 97% 98% 97%  Weight:      Height:       Wt Readings from Last 3 Encounters:  03/23/17 93 kg (205 lb)  09/02/16 97 kg (213 lb 12.8 oz)  08/08/16 93.9 kg (207 lb)    PHYSICAL EXAMINATION:  Physical Exam  Vitals reviewed. Constitutional: She is oriented to person, place, and time. She appears well-developed and well-nourished. No distress.  HENT:  Head: Normocephalic and atraumatic.  Mouth/Throat: Oropharynx is clear and moist.  Eyes: Conjunctivae and EOM are normal. Pupils are equal,  round, and reactive to light. No scleral icterus.  Neck: Normal range of motion. Neck supple. No JVD present. No thyromegaly present.  Cardiovascular: Normal rate, regular rhythm and intact distal pulses. Exam reveals no gallop and no friction rub.  No murmur heard. Respiratory: Effort normal and breath sounds normal. No respiratory distress. She has no wheezes. She has no rales.  GI: Soft. Bowel sounds are normal. She exhibits no distension. There is no tenderness.  Musculoskeletal: Normal range of motion. She exhibits no edema.  No arthritis, no gout  Lymphadenopathy:    She has no cervical adenopathy.  Neurological: She is alert and oriented to person, place, and time. No cranial nerve deficit.  No  dysarthria, no aphasia  Skin: Skin is warm and dry. No rash noted. No erythema.  Psychiatric: She has a normal mood and affect. Her behavior is normal. Judgment and thought content normal.    LABORATORY PANEL:   CBC Recent Labs  Lab 03/23/17 2109  WBC 7.3  HGB 12.3  HCT 36.1  PLT 151   ------------------------------------------------------------------------------------------------------------------  Chemistries  Recent Labs  Lab 03/23/17 2109  NA 140  K 3.0*  CL 102  CO2 27  GLUCOSE 134*  BUN 10  CREATININE 1.06*  CALCIUM 8.6*   ------------------------------------------------------------------------------------------------------------------  Cardiac Enzymes Recent Labs  Lab 03/23/17 2109  TROPONINI <0.03   ------------------------------------------------------------------------------------------------------------------  RADIOLOGY:  Dg Chest Port 1 View  Result Date: 03/23/2017 CLINICAL DATA:  Shortness of breath and weakness since last night. Sweating to the back of head and neck. EXAM: PORTABLE CHEST 1 VIEW COMPARISON:  03/06/2016 FINDINGS: Cardiac pacemaker. Cardiac enlargement with mild pulmonary vascular congestion. Perihilar and basilar interstitial pattern  likely represents interstitial edema. No focal consolidation. No blunting of costophrenic angles. No pneumothorax. Calcification of the aorta. Degenerative changes in the spine and shoulders. IMPRESSION: Cardiac enlargement with mild vascular congestion and interstitial edema in the lungs. No focal consolidation or effusion. Aortic atherosclerosis. Electronically Signed   By: Lucienne Capers M.D.   On: 03/23/2017 20:32    EKG:   Orders placed or performed during the hospital encounter of 03/23/17  . ED EKG  . ED EKG  . EKG 12-Lead  . EKG 12-Lead    IMPRESSION AND PLAN:  Principal Problem:   Anginal equivalent ([place]) -initial workup in the ED largely within normal limits except for her EKG changes.  She does have a pacemaker and is in a paced rhythm, though I would monitor receives it times she has beats that are not paced.  Initial EKG also seems to show underlying atrial activity consistent with A. fib, though this is not a new diagnosis for her.  We will trend her enzymes tonight, control her blood pressure, and get a cardiology consult Active Problems:   Chronic diastolic CHF (congestive heart failure) ([place]) -continue home meds   HTN (hypertension) -significantly elevated, continue home meds but also use additional PRN antihypertensives to keep her blood pressure less than 160/100.  Her elevated blood pressure may be contributing some to her symptoms as well.   Atrial fibrillation, chronic (HCC) -continue home dose anticoagulation, other workup as above   GERD (gastroesophageal reflux disease) -home dose PPI   Anxiety -home dose anxiolytics  All the records are reviewed and case discussed with ED provider. Management plans discussed with the patient and/or family.  DVT PROPHYLAXIS: Systemic anticoagulation  GI PROPHYLAXIS: PPI  ADMISSION STATUS: Observation  CODE STATUS: Full Code Status History    Date Active Date Inactive Code Status Order ID Comments User Context   03/04/2016  17:25 03/06/2016 14:02 Full Code [id number]  Constance Haw, MD Inpatient   02/22/2016 22:19 02/23/2016 19:56 Full Code [id number]  Etta Quill, DO ED    Advance Directive Documentation     Most Recent Value  Type of Advance Directive  Living will  Pre-existing out of facility DNR order (yellow form or pink MOST form)  No data  "MOST" Form in Place?  No data      TOTAL TIME TAKING CARE OF THIS PATIENT: 40 minutes.   Mical Brun Bowbells 03/23/2017, 10:26 PM  [employer]  [phone]  CC: Primary care physician; Kendrick Ranch,  MD  Note:  This document was prepared using Dragon voice recognition software and may include unintentional dictation errors.

## 2017-03-23 NOTE — ED Provider Notes (Signed)
[facility] Emergency Department Provider Note  ____________________________________________   First MD Initiated Contact with Patient 03/23/17 2009     (approximate)  I have reviewed the triage vital signs and the nursing notes.   HISTORY  Chief Complaint Weakness and Shortness of Breath    HPI Theresa Cain is a 83 y.o. female who comes to the emergency department via EMS after having an episode of shortness of breath diaphoresis and fatigue earlier today.  The patient's daughter called 911.  Symptoms began suddenly and went away on their own.  Nothing seemed to make it better or worse.  She did have some mild diffuse upper chest pain.  Nonradiating.  She has a long-standing cardiac history including pacemaker, atrial fibrillation, and coronary artery disease.  She is anticoagulated with Eliquis.  Past Medical History:  Diagnosis Date  . A-fib ([place])   . Angina at rest [facility])   . Anxiety   . Aortic valve stenosis   . Bilateral edema of lower extremity   . Bradycardia   . Carotid artery occlusion   . CHF (congestive heart failure) ([place])   . GERD (gastroesophageal reflux disease)   . Heart murmur   . High cholesterol   . Hypertension   . Obesity   . Osteoarthritis   . Presence of permanent cardiac pacemaker   . PVD (peripheral vascular disease) ([place])   . SOB (shortness of breath)   . Stomach cancer ([place]) ~ 2013  . Vertigo     Patient Active Problem List   Diagnosis Date Noted  . Anginal equivalent ([place]) 03/23/2017  . Chronic diastolic CHF (congestive heart failure) ([place]) 03/23/2017  . HTN (hypertension) 03/23/2017  . Atrial fibrillation, chronic ([place]) 03/23/2017  . GERD (gastroesophageal reflux disease) 03/23/2017  . Anxiety 03/23/2017  . Persistent atrial fibrillation ([place])   . Pacemaker lead failure 03/05/2016  . Tachy-brady syndrome ([place]) 03/04/2016  . Acute on chronic diastolic CHF (congestive heart failure) ([place]) 02/22/2016    Past  Surgical History:  Procedure Laterality Date  . CARDIOVASCULAR STRESS TEST    . CARDIOVERSION N/A 08/08/2016   Procedure: CARDIOVERSION;  Surgeon: Sueanne Margarita, MD;  Location: [facility] ENDOSCOPY;  Service: Cardiovascular;  Laterality: N/A;  . CAROTID ENDARTERECTOMY Right ~ 2005  . CATARACT EXTRACTION W/ INTRAOCULAR LENS IMPLANT Right   . CHOLECYSTECTOMY  ~ 2013   "part of her [place] OR"  . EP IMPLANTABLE DEVICE N/A 03/04/2016   Procedure: Pacemaker Implant;  Surgeon: Will Meredith Leeds, MD;  Location: [place] CV LAB;  Service: Cardiovascular;  Laterality: N/A;  . EP IMPLANTABLE DEVICE N/A 03/05/2016   Procedure: Lead Revision/Repair;  Surgeon: Deboraha Sprang, MD;  Location: [place] CV LAB;  Service: Cardiovascular;  Laterality: N/A;  . INSERT / REPLACE / REMOVE PACEMAKER    . US ECHOCARDIOGRAPHY    . VAGINAL HYSTERECTOMY    . WHIPPLE PROCEDURE  ~ 2013    Prior to Admission medications   Medication Sig Start Date End Date Taking? Authorizing Provider  acetaminophen (TYLENOL) 500 MG tablet Take 1,000 mg by mouth every 6 (six) hours as needed for moderate pain.    [provider]  apixaban (ELIQUIS) 5 MG TABS tablet Take 5 mg by mouth 2 (two) times daily.    [provider]  diltiazem (CARDIZEM CD) 180 MG 24 hr capsule Take 1 capsule (180 mg total) by mouth daily. 05/20/16   Camnitz, Will Hassell Done, MD  escitalopram (LEXAPRO) 10 MG tablet Take 10  mg by mouth at bedtime.     [provider]  gabapentin (NEURONTIN) 600 MG tablet Take 1,200 mg by mouth 2 (two) times daily.     [provider]  Multiple Vitamin (MULTIVITAMIN WITH MINERALS) TABS tablet Take 1 tablet by mouth daily.    [provider]  omeprazole (PRILOSEC) 20 MG capsule Take 20 mg by mouth daily.    [provider]  oxybutynin (DITROPAN-XL) 10 MG 24 hr tablet Take 10 mg by mouth at bedtime.    [provider]  pravastatin (PRAVACHOL) 40 MG tablet Take 40 mg by  mouth every evening.     [provider]  torsemide (DEMADEX) 20 MG tablet Take 20 mg by mouth daily.    [provider]  Vitamin D, Ergocalciferol, (DRISDOL) 50000 units CAPS capsule Take 50,000 Units by mouth 2 (two) times a week.    [provider]    Allergies Penicillins and Sulfa antibiotics  Family History  Problem Relation Age of Onset  . Heart disease Sister     Social History Social History   Tobacco Use  . Smoking status: Never Smoker  . Smokeless tobacco: Never Used  Substance Use Topics  . Alcohol use: No  . Drug use: No    Review of Systems Constitutional: No fever/chills Eyes: No visual changes. ENT: No sore throat. Cardiovascular: Positive for chest pain. Respiratory: Positive for shortness of breath. Gastrointestinal: No abdominal pain.  No nausea, no vomiting.  No diarrhea.  No constipation. Genitourinary: Negative for dysuria. Musculoskeletal: Negative for back pain. Skin: Negative for rash. Neurological: Negative for headaches, focal weakness or numbness.   ____________________________________________   PHYSICAL EXAM:  VITAL SIGNS: ED Triage Vitals  Enc Vitals Group     BP 03/23/17 2007 (!) 187/93     Pulse Rate 03/23/17 2007 60     Resp 03/23/17 2007 (!) 21     Temp 03/23/17 2007 (!) 97.3 F (36.3 C)     Temp Source 03/23/17 2007 Oral     SpO2 03/23/17 1953 90 %     Weight 03/23/17 2008 205 lb (93 kg)     Height 03/23/17 2008 6' (1.829 m)     Head Circumference --      Peak Flow --      Pain Score 03/23/17 2005 0     Pain Loc --      Pain Edu? --      Excl. in [place]? --     Constitutional: Alert and oriented x4 joking laughing very well-appearing nontoxic no diaphoresis speaks in full clear sentences Eyes: PERRL EOMI. Head: Atraumatic. Nose: No congestion/rhinnorhea. Mouth/Throat: No trismus Neck: No stridor.   Cardiovascular: Normal rate, regular rhythm. Grossly normal heart sounds.  Good peripheral  circulation. Respiratory: Normal respiratory effort.  No retractions. Lungs CTAB and moving good air Gastrointestinal: Soft nontender Musculoskeletal: No lower extremity edema   Neurologic:  Normal speech and language. No gross focal neurologic deficits are appreciated. Skin:  Skin is warm, dry and intact. No rash noted. Psychiatric: Mood and affect are normal. Speech and behavior are normal.    ____________________________________________   DIFFERENTIAL includes but not limited to  Acute coronary syndrome, ventricular dysrhythmia, metabolic derangement, atrial fibrillation ____________________________________________   LABS (all labs ordered are listed, but only abnormal results are displayed)  Labs Reviewed  URINALYSIS, COMPLETE (UACMP) WITH MICROSCOPIC - Abnormal; Notable for the following components:      Result Value   Color, Urine STRAW (*)  APPearance CLEAR (*)    Squamous Epithelial / LPF 0-5 (*)    All other components within normal limits  CBC - Abnormal; Notable for the following components:   RDW 14.9 (*)    All other components within normal limits  BRAIN NATRIURETIC PEPTIDE - Abnormal; Notable for the following components:   B Natriuretic Peptide 333.0 (*)    All other components within normal limits  BASIC METABOLIC PANEL - Abnormal; Notable for the following components:   Potassium 3.0 (*)    Glucose, Bld 134 (*)    Creatinine, Ser 1.06 (*)    Calcium 8.6 (*)    GFR calc non Af Amer 45 (*)    GFR calc Af Amer 53 (*)    All other components within normal limits  TROPONIN I    Lab work reviewed by me with no signs of acute ischemia __________________________________________  EKG  ED ECG REPORT I, Darel Hong, the attending physician, personally viewed and interpreted this ECG.  Date: 03/23/2017 EKG Time:  Rate: 67 Rhythm: Intermittently paced rhythm QRS Axis: normal Intervals: Prolonged QTC ST/T Wave abnormalities: Deep T wave inversion V1  V2 V3 V4 V5 V6 leads III aVF which are all new from previous EKG performed 09/02/16 Narrative Interpretation: No frank ST elevation although with diffuse T wave inversion concerning for acute ischemia  ____________________________________________  RADIOLOGY  Chest x-ray reviewed by me with mild interstitial edema otherwise on ____________________________________________   PROCEDURES  Procedure(s) performed: no  Procedures  Critical Care performed: no  Observation: no ____________________________________________   INITIAL IMPRESSION / ASSESSMENT AND PLAN / ED COURSE  Pertinent labs & imaging results that were available during my care of the patient were reviewed by me and considered in my medical decision making (see chart for details).  On arrival the patient is remarkably well-appearing, although her first EKG is concerning with deep T wave inversions anteriorly.  She has a long cardiac history including a pacemaker coronary artery disease so broad labs are pending in addition to interrogating her pacemaker.    ----------------------------------------- 10:07 PM on 03/23/2017 -----------------------------------------  I discussed the case with the hospitalist Dr. Jannifer Franklin who has graciously agreed to admit the patient to his service and has asked me to consult cardiology.  I spoke with Dr. Caryl Comes of [facility] health medical group who will kindly come evaluate the patient in the morning.  He recommends no specific treatment at this time aside from telemetry, observation, and serial troponins.  _______________________  ----------------------------------------- 10:26 PM on 03/23/2017 ----------------------------------------- I was called by the [employer] rep to let me know that the patient has had no ectopy today and the only arrhythmia has been atrial fibrillation that has been rate controlled. _____________________   FINAL CLINICAL IMPRESSION(S) / ED DIAGNOSES  Final diagnoses:    Shortness of breath      NEW MEDICATIONS STARTED DURING THIS VISIT:  This SmartLink is deprecated. Use AVSMEDLIST instead to display the medication list for a patient.   Note:  This document was prepared using Dragon voice recognition software and may include unintentional dictation errors.     Darel Hong, MD 03/23/17 2230

## 2017-03-23 NOTE — ED Triage Notes (Signed)
Pt states shortness of breath and weakness since last night. Pt c/o profuse sweating to back of head and neck earlier today. CBG 128.  Pt w/ negative CSS/LA negative.

## 2017-03-24 ENCOUNTER — Observation Stay (HOSPITAL_COMMUNITY)
Admit: 2017-03-24 | Discharge: 2017-03-24 | Disposition: A | Discharge status: Home / Self Care | Payer: Medicare Other | Attending: Internal Medicine | Admitting: Internal Medicine

## 2017-03-24 ENCOUNTER — Other Ambulatory Visit: Payer: Self-pay

## 2017-03-24 DIAGNOSIS — K219 Gastro-esophageal reflux disease without esophagitis: Secondary | ICD-10-CM | POA: Diagnosis present

## 2017-03-24 DIAGNOSIS — I482 Chronic atrial fibrillation: Secondary | ICD-10-CM

## 2017-03-24 DIAGNOSIS — Z950 Presence of cardiac pacemaker: Secondary | ICD-10-CM

## 2017-03-24 DIAGNOSIS — I110 Hypertensive heart disease with heart failure: Secondary | ICD-10-CM | POA: Diagnosis present

## 2017-03-24 DIAGNOSIS — R946 Abnormal results of thyroid function studies: Secondary | ICD-10-CM | POA: Diagnosis present

## 2017-03-24 DIAGNOSIS — I4581 Long QT syndrome: Secondary | ICD-10-CM | POA: Diagnosis present

## 2017-03-24 DIAGNOSIS — R9431 Abnormal electrocardiogram [ECG] [EKG]: Secondary | ICD-10-CM

## 2017-03-24 DIAGNOSIS — I6529 Occlusion and stenosis of unspecified carotid artery: Secondary | ICD-10-CM | POA: Diagnosis present

## 2017-03-24 DIAGNOSIS — I4892 Unspecified atrial flutter: Secondary | ICD-10-CM | POA: Diagnosis present

## 2017-03-24 DIAGNOSIS — I472 Ventricular tachycardia: Secondary | ICD-10-CM | POA: Diagnosis present

## 2017-03-24 DIAGNOSIS — I272 Pulmonary hypertension, unspecified: Secondary | ICD-10-CM | POA: Diagnosis not present

## 2017-03-24 DIAGNOSIS — I25118 Atherosclerotic heart disease of native coronary artery with other forms of angina pectoris: Secondary | ICD-10-CM | POA: Diagnosis present

## 2017-03-24 DIAGNOSIS — I10 Essential (primary) hypertension: Secondary | ICD-10-CM | POA: Diagnosis not present

## 2017-03-24 DIAGNOSIS — Z880 Allergy status to penicillin: Secondary | ICD-10-CM | POA: Diagnosis not present

## 2017-03-24 DIAGNOSIS — F419 Anxiety disorder, unspecified: Secondary | ICD-10-CM | POA: Diagnosis present

## 2017-03-24 DIAGNOSIS — R0602 Shortness of breath: Secondary | ICD-10-CM

## 2017-03-24 DIAGNOSIS — E876 Hypokalemia: Secondary | ICD-10-CM | POA: Diagnosis present

## 2017-03-24 DIAGNOSIS — I495 Sick sinus syndrome: Secondary | ICD-10-CM | POA: Diagnosis not present

## 2017-03-24 DIAGNOSIS — N179 Acute kidney failure, unspecified: Secondary | ICD-10-CM | POA: Diagnosis present

## 2017-03-24 DIAGNOSIS — I739 Peripheral vascular disease, unspecified: Secondary | ICD-10-CM | POA: Diagnosis present

## 2017-03-24 DIAGNOSIS — I5032 Chronic diastolic (congestive) heart failure: Secondary | ICD-10-CM

## 2017-03-24 DIAGNOSIS — E785 Hyperlipidemia, unspecified: Secondary | ICD-10-CM | POA: Diagnosis present

## 2017-03-24 DIAGNOSIS — M1990 Unspecified osteoarthritis, unspecified site: Secondary | ICD-10-CM | POA: Diagnosis present

## 2017-03-24 DIAGNOSIS — I4721 Torsades de pointes: Secondary | ICD-10-CM

## 2017-03-24 DIAGNOSIS — Z7901 Long term (current) use of anticoagulants: Secondary | ICD-10-CM | POA: Diagnosis not present

## 2017-03-24 DIAGNOSIS — R531 Weakness: Secondary | ICD-10-CM | POA: Diagnosis present

## 2017-03-24 DIAGNOSIS — Z882 Allergy status to sulfonamides status: Secondary | ICD-10-CM | POA: Diagnosis not present

## 2017-03-24 DIAGNOSIS — Z79899 Other long term (current) drug therapy: Secondary | ICD-10-CM | POA: Diagnosis not present

## 2017-03-24 DIAGNOSIS — I350 Nonrheumatic aortic (valve) stenosis: Secondary | ICD-10-CM | POA: Diagnosis present

## 2017-03-24 LAB — TROPONIN I
Troponin I: 0.03 ng/mL (ref <0.03)
Troponin I: 0.03 ng/mL (ref <0.03)

## 2017-03-24 LAB — CBC
HEMATOCRIT: 36 % (ref 35.0–47.0)
Hemoglobin: 12.5 g/dL (ref 12.0–16.0)
MCH: 30.2 pg (ref 26.0–34.0)
MCHC: 34.6 g/dL (ref 32.0–36.0)
MCV: 87.2 fL (ref 80.0–100.0)
Platelets: 148 10*3/uL — ABNORMAL LOW (ref 150–440)
RBC: 4.14 MIL/uL (ref 3.80–5.20)
RDW: 14.7 % — AB (ref 11.5–14.5)
WBC: 7.4 10*3/uL (ref 3.6–11.0)

## 2017-03-24 LAB — BASIC METABOLIC PANEL
Anion gap: 7 (ref 5–15)
BUN: 10 mg/dL (ref 6–20)
CHLORIDE: 104 mmol/L (ref 101–111)
CO2: 30 mmol/L (ref 22–32)
Calcium: 8.3 mg/dL — ABNORMAL LOW (ref 8.9–10.3)
Creatinine, Ser: 0.89 mg/dL (ref 0.44–1.00)
GFR calc Af Amer: >60 mL/min (ref >60)
GFR calc non Af Amer: 56 mL/min — ABNORMAL LOW (ref >60)
GLUCOSE: 120 mg/dL — AB (ref 65–99)
POTASSIUM: 3.4 mmol/L — AB (ref 3.5–5.1)
Sodium: 141 mmol/L (ref 135–145)

## 2017-03-24 LAB — ECHOCARDIOGRAM COMPLETE
Height: 72 in
Weight: 3284.8 oz

## 2017-03-24 LAB — MAGNESIUM: Magnesium: 1.9 mg/dL (ref 1.7–2.4)

## 2017-03-24 MED ORDER — TORSEMIDE 20 MG PO TABS
10.0000 mg | ORAL_TABLET | Freq: Two times a day (BID) | ORAL | Status: DC
  Administered 2017-03-24: 10 mg via ORAL
  Filled 2017-03-24: qty 1

## 2017-03-24 MED ORDER — ESCITALOPRAM OXALATE 10 MG PO TABS
10.0000 mg | ORAL_TABLET | Freq: Every day | ORAL | Status: DC
  Administered 2017-03-24 – 2017-03-26 (×3): 10 mg via ORAL
  Filled 2017-03-24 (×5): qty 1

## 2017-03-24 MED ORDER — TORSEMIDE 20 MG PO TABS
20.0000 mg | ORAL_TABLET | Freq: Two times a day (BID) | ORAL | Status: DC
  Administered 2017-03-24 – 2017-03-27 (×6): 20 mg via ORAL
  Filled 2017-03-24 (×6): qty 1

## 2017-03-24 MED ORDER — DILTIAZEM HCL ER COATED BEADS 180 MG PO CP24
180.0000 mg | ORAL_CAPSULE | Freq: Every day | ORAL | Status: DC
  Administered 2017-03-24 – 2017-03-27 (×4): 180 mg via ORAL
  Filled 2017-03-24 (×4): qty 1

## 2017-03-24 MED ORDER — HYDRALAZINE HCL 20 MG/ML IJ SOLN: 10.0000 mg | INTRAMUSCULAR | Status: DC | PRN

## 2017-03-24 MED ORDER — PANTOPRAZOLE SODIUM 40 MG PO TBEC
40.0000 mg | DELAYED_RELEASE_TABLET | Freq: Every day | ORAL | Status: DC
  Administered 2017-03-24 – 2017-03-27 (×4): 40 mg via ORAL
  Filled 2017-03-24 (×4): qty 1

## 2017-03-24 MED ORDER — ONDANSETRON HCL 4 MG/2ML IJ SOLN: 4.0000 mg | Freq: Four times a day (QID) | INTRAMUSCULAR | Status: DC | PRN

## 2017-03-24 MED ORDER — ACETAMINOPHEN 325 MG PO TABS: 650.0000 mg | ORAL_TABLET | Freq: Four times a day (QID) | ORAL | Status: DC | PRN

## 2017-03-24 MED ORDER — GABAPENTIN 600 MG PO TABS
600.0000 mg | ORAL_TABLET | Freq: Two times a day (BID) | ORAL | Status: DC
  Administered 2017-03-24 – 2017-03-27 (×6): 600 mg via ORAL
  Filled 2017-03-24 (×6): qty 1

## 2017-03-24 MED ORDER — TORSEMIDE 20 MG PO TABS: 20.0000 mg | ORAL_TABLET | Freq: Every day | ORAL | Status: DC

## 2017-03-24 MED ORDER — GABAPENTIN 600 MG PO TABS
1200.0000 mg | ORAL_TABLET | Freq: Two times a day (BID) | ORAL | Status: DC
  Administered 2017-03-24: 1200 mg via ORAL
  Filled 2017-03-24: qty 2

## 2017-03-24 MED ORDER — POTASSIUM CHLORIDE CRYS ER 20 MEQ PO TBCR: 40.0000 meq | EXTENDED_RELEASE_TABLET | Freq: Once | ORAL | Status: DC

## 2017-03-24 MED ORDER — SODIUM CHLORIDE 0.9% FLUSH
3.0000 mL | Freq: Two times a day (BID) | INTRAVENOUS | Status: DC
  Administered 2017-03-24 – 2017-03-27 (×4): 3 mL via INTRAVENOUS

## 2017-03-24 MED ORDER — POTASSIUM CHLORIDE CRYS ER 20 MEQ PO TBCR
40.0000 meq | EXTENDED_RELEASE_TABLET | Freq: Every day | ORAL | Status: DC
  Administered 2017-03-24 – 2017-03-27 (×4): 40 meq via ORAL
  Filled 2017-03-24 (×5): qty 2

## 2017-03-24 MED ORDER — PRAVASTATIN SODIUM 40 MG PO TABS
40.0000 mg | ORAL_TABLET | Freq: Every evening | ORAL | Status: DC
  Administered 2017-03-25 – 2017-03-26 (×2): 40 mg via ORAL
  Filled 2017-03-24 (×2): qty 1

## 2017-03-24 MED ORDER — FUROSEMIDE 10 MG/ML IJ SOLN
INTRAMUSCULAR | Status: AC
Start: 2017-03-24 — End: 2017-03-24
  Administered 2017-03-24: 40 mg via INTRAVENOUS
  Filled 2017-03-24: qty 4

## 2017-03-24 MED ORDER — OXYBUTYNIN CHLORIDE ER 10 MG PO TB24
10.0000 mg | ORAL_TABLET | Freq: Every day | ORAL | Status: DC
  Administered 2017-03-24 – 2017-03-26 (×3): 10 mg via ORAL
  Filled 2017-03-24 (×5): qty 1

## 2017-03-24 MED ORDER — APIXABAN 5 MG PO TABS
5.0000 mg | ORAL_TABLET | Freq: Two times a day (BID) | ORAL | Status: DC
  Administered 2017-03-24 (×2): 5 mg via ORAL
  Filled 2017-03-24 (×2): qty 1

## 2017-03-24 MED ORDER — HYDRALAZINE HCL 20 MG/ML IJ SOLN
10.0000 mg | INTRAMUSCULAR | Status: DC | PRN
  Administered 2017-03-24 (×2): 10 mg via INTRAVENOUS
  Filled 2017-03-24 (×2): qty 1

## 2017-03-24 MED ORDER — ONDANSETRON HCL 4 MG PO TABS: 4.0000 mg | ORAL_TABLET | Freq: Four times a day (QID) | ORAL | Status: DC | PRN

## 2017-03-24 MED ORDER — ACETAMINOPHEN 650 MG RE SUPP: 650.0000 mg | Freq: Four times a day (QID) | RECTAL | Status: DC | PRN

## 2017-03-24 NOTE — Progress Notes (Signed)
Patient developed sudden cough, crackles can be heard throughout the lungs. Patient didn't receive dose of nighttime torsemide. MD Marcille Blanco made aware. Verbal order given to give AM dose early. Will administer and continue to monitor.   Theresa Cain

## 2017-03-24 NOTE — Plan of Care (Signed)
  Progressing Education: Knowledge of General Education information will improve 03/24/2017 0252 - Progressing by Loran Senters, RN Clinical Measurements: Diagnostic test results will improve 03/24/2017 0252 - Progressing by Loran Senters, RN Activity: Risk for activity intolerance will decrease 03/24/2017 0252 - Progressing by Loran Senters, RN Safety: Ability to remain free from injury will improve 03/24/2017 0252 - Progressing by Loran Senters, RN

## 2017-03-24 NOTE — Care Management Note (Signed)
Case Management Note  Patient Details  Name: Theresa Cain MRN: [id number] Date of Birth: [birth year]  Subjective/Objective:                 Patient presented from home with shortness of breath and weakness.  has has some telemetry runs of sustained v tach, torsade de pointe and nonsustained v tach.  Chronic Eliquis for atrial fib. Cardiology is consulting with EP and considering changing ventricular rate on pacemaker. No issues accessing medical care, with transportation or obtaining medications.  Current with pcp.  Son lives next door to patient   Action/Plan:   Expected Discharge Date:                  Expected Discharge Plan:     In-House Referral:     Discharge planning Services     Post Acute Care Choice:    Choice offered to:     DME Arranged:    DME Agency:     HH Arranged:    [place] Agency:     Status of Service:     If discussed at [employer], dates discussed:    Additional Comments:  Katrina Stack, RN 03/24/2017, 3:31 PM

## 2017-03-24 NOTE — Plan of Care (Signed)
Patient needs education about low salt diet provide exit care sheets and dietary consult.

## 2017-03-24 NOTE — Progress Notes (Signed)
[facility] NAME: Theresa Cain    MR#:  [id number]  DATE OF BIRTH:  30-Aug-1926  SUBJECTIVE:  CHIEF COMPLAINT:   Chief Complaint  Patient presents with  . Weakness  . Shortness of Breath  Had a short run of V. tach this morning around 830. patient feeling fine now, sitting in the  Bed, family at bedside REVIEW OF SYSTEMS:  Review of Systems  Constitutional: Positive for malaise/fatigue. Negative for chills, fever and weight loss.  HENT: Negative for nosebleeds and sore throat.   Eyes: Negative for blurred vision.  Respiratory: Negative for cough, shortness of breath and wheezing.   Cardiovascular: Negative for chest pain, orthopnea, leg swelling and PND.  Gastrointestinal: Negative for abdominal pain, constipation, diarrhea, heartburn, nausea and vomiting.  Genitourinary: Negative for dysuria and urgency.  Musculoskeletal: Negative for back pain.  Skin: Negative for rash.  Neurological: Positive for weakness. Negative for dizziness, speech change, focal weakness and headaches.  Endo/Heme/Allergies: Does not bruise/bleed easily.  Psychiatric/Behavioral: Negative for depression.    DRUG ALLERGIES:   Allergies  Allergen Reactions  . Penicillins Hives and Other (See Comments)    Has patient had a PCN reaction causing immediate rash, facial/tongue/throat swelling, SOB or lightheadedness with hypotension: Yes Has patient had a PCN reaction causing severe rash involving mucus membranes or skin necrosis: No Has patient had a PCN reaction that required hospitalization: No Has patient had a PCN reaction occurring within the last 10 years: No If all of the above answers are "NO", then may proceed with Cephalosporin use.   . Sulfa Antibiotics Hives   VITALS:  Blood pressure (!) 158/59, pulse 71, temperature (!) 97.5 F (36.4 C), temperature source Oral, resp. rate 18, height 6' (1.829 m), weight 93.1 kg (205 lb 4.8 oz), SpO2  92 %. PHYSICAL EXAMINATION:  Physical Exam  Constitutional: She is oriented to person, place, and time and well-developed, well-nourished, and in no distress.  HENT:  Head: Normocephalic and atraumatic.  Eyes: Conjunctivae and EOM are normal. Pupils are equal, round, and reactive to light.  Neck: Normal range of motion. Neck supple. No tracheal deviation present. No thyromegaly present.  Cardiovascular: Normal rate, regular rhythm and normal heart sounds.  Pulmonary/Chest: Effort normal and breath sounds normal. No respiratory distress. She has no wheezes. She exhibits no tenderness.  Abdominal: Soft. Bowel sounds are normal. She exhibits no distension. There is no tenderness.  Musculoskeletal: Normal range of motion.  Neurological: She is alert and oriented to person, place, and time. No cranial nerve deficit.  Skin: Skin is warm and dry. No rash noted.  Psychiatric: Mood and affect normal.   LABORATORY PANEL:  Female CBC Recent Labs  Lab 03/24/17 0510  WBC 7.4  HGB 12.5  HCT 36.0  PLT 148*   ------------------------------------------------------------------------------------------------------------------ Chemistries  Recent Labs  Lab 03/24/17 0510 03/24/17 1044  NA 141  --   K 3.4*  --   CL 104  --   CO2 30  --   GLUCOSE 120*  --   BUN 10  --   CREATININE 0.89  --   CALCIUM 8.3*  --   MG  --  1.9   RADIOLOGY:  Dg Chest Port 1 View  Result Date: 03/23/2017 CLINICAL DATA:  Shortness of breath and weakness since last night. Sweating to the back of head and neck. EXAM: PORTABLE CHEST 1 VIEW COMPARISON:  03/06/2016 FINDINGS: Cardiac  pacemaker. Cardiac enlargement with mild pulmonary vascular congestion. Perihilar and basilar interstitial pattern likely represents interstitial edema. No focal consolidation. No blunting of costophrenic angles. No pneumothorax. Calcification of the aorta. Degenerative changes in the spine and shoulders. IMPRESSION: Cardiac enlargement with mild  vascular congestion and interstitial edema in the lungs. No focal consolidation or effusion. Aortic atherosclerosis. Electronically Signed   By: Lucienne Capers M.D.   On: 03/23/2017 20:32   ASSESSMENT AND PLAN:  84 y.o. female with a hx of tachybradycardia syndrome, permanent atrial fibrillation Chronic diastolic CHF, moderate to severe aortic valve stenosis admitted for diaphoresis, weakness and EKG abnormality  * Torsade de pointes - Likely causing her diaphoresis and weakness  Case Discussed with cardiology Dr. Rockey Situ -Aggressive electrolyte replacement to keep potassium close to 4 and mag close to 2.0 - Given frequent ectopy/PVCs, Dr Rockey Situ discussed case with Dr. Caryl Comes who suggests pacer changes, increased ventricular rate - no ischemic eval planned this admission  * Chronic diastolic CHF Moderately elevated right heart pressures on echocardiogram - increased torsemide up to 20 mill grams twice daily with potassium supplement daily per cardio  * Severe aortic valve stenosis -Repeat echo, cardiology following  * Permanent atrial fibrillation/flutter On Eliquis for anticoagulation -Rate controlled by Cardizem CD 180 mg daily  * hypokalemia -Replete and recheck  * HTN (hypertension) -remains elevated -Continue Cardizem and torsemide as above -Hydralazine needed for better blood pressure control  * GERD (gastroesophageal reflux disease) -home dose PPI  * Anxiety -home dose anxiolytics      All the records are reviewed and case discussed with Care Management/Social Worker. Management plans discussed with the patient, family and they are in agreement.  CODE STATUS: Full Code  TOTAL TIME TAKING CARE OF THIS PATIENT: 35 minutes.   More than 50% of the time was spent in counseling/coordination of care: YES  POSSIBLE D/C IN 1-2 DAYS, DEPENDING ON CLINICAL CONDITION & Cardiac  Evaluation   Max Sane M.D on 03/24/2017 at 2:14 PM  Between 7am to 6pm - Pager -  [phone]  After 6pm go to [email or web address] - Proofreader  [employer] [facility]  Office  [phone]  CC: Primary care physician; Kendrick [place], MD  Note: This dictation was prepared with Dragon dictation along with smaller phrase technology. Any transcriptional errors that result from this process are unintentional.

## 2017-03-24 NOTE — Consult Note (Signed)
Cardiology Consultation:   Patient ID: Theresa Cain; [id number]; May 22, 1926   Admit date: 03/23/2017 Date of Consult: 03/24/2017  Primary Care Provider: Kendrick Ranch, MD Primary Cardiologist: [facility] Physician requesting consult: Dr. Manuella Ghazi Reason for consult: Ventricular tachycardia, torsades de pointes    Patient Profile:   Theresa Cain is a 84 y.o. female with a hx of tachybradycardia syndrome, permanent atrial fibrillation Chronic diastolic CHF, aortic valve stenosis moderate to severe presenting with, diaphoresis, weakness   History of Present Illness:   Theresa Cain reports that she was in her usual state of health when acutely yesterday she felt diaphoretic and weak.  Denies any sick contacts, no cough, congestion, lower extremity edema, no rigors No dysuria She presented to the emergency room, Chest x-ray with possible mild pulmonary edema otherwise UA negative Other lab work including CBC unrevealing Potassium 3.0 Overnight with no symptoms,  This morning felt acutely weak when standing up Telemetry reviewed 8:32 AM noted to have sustained VT, torsade de pointes, frequent PVCs, short runs of nonsustained VT  No further arrhythmia through the morning Denies having any similar symptoms in the past No recent stressors, no diarrhea No recent medication changes She does not take potassium at home with her torsemide 20 mg daily  Review of lab work shows potassium 4.6 seven months ago, 4.3 one year ago   Past Medical History:  Diagnosis Date  . A-fib ([place])   . Angina at rest [facility])   . Anxiety   . Aortic valve stenosis   . Bilateral edema of lower extremity   . Bradycardia   . Carotid artery occlusion   . CHF (congestive heart failure) ([place])   . GERD (gastroesophageal reflux disease)   . Heart murmur   . High cholesterol   . Hypertension   . Obesity   . Osteoarthritis   . Presence of permanent cardiac pacemaker   . PVD (peripheral vascular  disease) ([place])   . SOB (shortness of breath)   . Stomach cancer ([place]) ~ 2013  . Vertigo     Past Surgical History:  Procedure Laterality Date  . CARDIOVASCULAR STRESS TEST    . CARDIOVERSION N/A 08/08/2016   Procedure: CARDIOVERSION;  Surgeon: Sueanne Margarita, MD;  Location: [facility] ENDOSCOPY;  Service: Cardiovascular;  Laterality: N/A;  . CAROTID ENDARTERECTOMY Right ~ 2005  . CATARACT EXTRACTION W/ INTRAOCULAR LENS IMPLANT Right   . CHOLECYSTECTOMY  ~ 2013   "part of her [place] OR"  . EP IMPLANTABLE DEVICE N/A 03/04/2016   Procedure: Pacemaker Implant;  Surgeon: Will Meredith Leeds, MD;  Location: [place] CV LAB;  Service: Cardiovascular;  Laterality: N/A;  . EP IMPLANTABLE DEVICE N/A 03/05/2016   Procedure: Lead Revision/Repair;  Surgeon: Deboraha Sprang, MD;  Location: [place] CV LAB;  Service: Cardiovascular;  Laterality: N/A;  . INSERT / REPLACE / REMOVE PACEMAKER    . US ECHOCARDIOGRAPHY    . VAGINAL HYSTERECTOMY    . WHIPPLE PROCEDURE  ~ 2013     Home Medications:  Prior to Admission medications   Medication Sig Start Date End Date Taking? Authorizing Provider  acetaminophen (TYLENOL) 500 MG tablet Take 1,000 mg by mouth every 6 (six) hours as needed for moderate pain.   Yes [provider]  apixaban (ELIQUIS) 5 MG TABS tablet Take 5 mg by mouth 2 (two) times daily.   Yes [provider]  diltiazem (CARDIZEM CD) 180 MG 24 hr capsule Take 1 capsule (180 mg total) by mouth daily. 05/20/16  Yes Camnitz, Will Hassell Done, MD  escitalopram (LEXAPRO) 10 MG tablet Take 10 mg by mouth at bedtime.    Yes [provider]  ferrous sulfate 325 (65 FE) MG tablet Take 325 mg by mouth daily with breakfast.   Yes [provider]  gabapentin (NEURONTIN) 600 MG tablet Take 1,200 mg by mouth 2 (two) times daily.    Yes [provider]  omeprazole (PRILOSEC) 20 MG capsule Take 20 mg by mouth daily.   Yes [provider]  pravastatin (PRAVACHOL)  40 MG tablet Take 40 mg by mouth every evening.    Yes [provider]  torsemide (DEMADEX) 20 MG tablet Take 10 mg by mouth 2 (two) times daily.    Yes [provider]  Vitamin D, Ergocalciferol, (DRISDOL) 50000 units CAPS capsule Take 50,000 Units by mouth 2 (two) times a week. Take on Sunday and Thursday   Yes [provider]    Inpatient Medications: Scheduled Meds: . apixaban  5 mg Oral BID  . diltiazem  180 mg Oral Daily  . escitalopram  10 mg Oral QHS  . gabapentin  1,200 mg Oral BID  . oxybutynin  10 mg Oral QHS  . pantoprazole  40 mg Oral Daily  . potassium chloride  40 mEq Oral Daily  . pravastatin  40 mg Oral QPM  . torsemide  20 mg Oral BID   Continuous Infusions:  PRN Meds: acetaminophen **OR** acetaminophen, hydrALAZINE, ondansetron **OR** ondansetron (ZOFRAN) IV  Allergies:    Allergies  Allergen Reactions  . Penicillins Hives and Other (See Comments)    Has patient had a PCN reaction causing immediate rash, facial/tongue/throat swelling, SOB or lightheadedness with hypotension: Yes Has patient had a PCN reaction causing severe rash involving mucus membranes or skin necrosis: No Has patient had a PCN reaction that required hospitalization: No Has patient had a PCN reaction occurring within the last 10 years: No If all of the above answers are "NO", then may proceed with Cephalosporin use.   . Sulfa Antibiotics Hives    Social History:   Social History   Socioeconomic History  . Marital status: Widowed    Spouse name: Not on file  . Number of children: Not on file  . Years of education: Not on file  . Highest education level: Not on file  Social Needs  . Financial resource strain: Not on file  . Food insecurity - worry: Not on file  . Food insecurity - inability: Not on file  . Transportation needs - medical: Not on file  . Transportation needs - non-medical: Not on file  Occupational History  . Not on file  Tobacco Use  .  Smoking status: Never Smoker  . Smokeless tobacco: Never Used  Substance and Sexual Activity  . Alcohol use: No  . Drug use: No  . Sexual activity: No  Other Topics Concern  . Not on file  Social History Narrative  . Not on file    Family History:    Family History  Problem Relation Age of Onset  . Heart disease Sister      ROS:  Please see the history of present illness.  Review of Systems  Constitution: Positive for weakness. Negative for diaphoresis, fever, malaise/fatigue and night sweats.       Diaphoresis  HENT: Negative.   Eyes: Negative.   Cardiovascular: Negative for chest pain, claudication, cyanosis, dyspnea on exertion, irregular heartbeat, leg swelling, near-syncope, orthopnea, palpitations and paroxysmal nocturnal dyspnea.  Respiratory: Negative for cough, shortness of breath, sleep disturbances due to breathing and wheezing.   Endocrine: Negative.   Hematologic/Lymphatic: Negative.   Skin: Negative.   Musculoskeletal: Negative for falls, joint pain, joint swelling and myalgias.  Gastrointestinal: Negative.   Neurological: Negative for difficulty with concentration, excessive daytime sleepiness, dizziness, focal weakness, light-headedness and numbness.  Psychiatric/Behavioral: Negative.    All other ROS reviewed and negative.     Physical Exam/Data:   Vitals:   03/24/17 0226 03/24/17 0415 03/24/17 0633 03/24/17 0742  BP: (!) 168/63 (!) 167/74 (!) 143/65 (!) 158/59  Pulse: 63 62 70 71  Resp:  18  18  Temp:  98.1 F (36.7 C)  (!) 97.5 F (36.4 C)  TempSrc:  Oral  Oral  SpO2: 97% 98%  92%  Weight:      Height:        Intake/Output Summary (Last 24 hours) at 03/24/2017 1343 Last data filed at 03/24/2017 1253 Gross per 24 hour  Intake -  Output 675 ml  Net -675 ml   Filed Weights   03/23/17 2008 03/24/17 0021  Weight: 205 lb (93 kg) 205 lb 4.8 oz (93.1 kg)   Body mass index is 27.84 kg/m.  General:  Well nourished, well developed, in no acute  distress HEENT: normal Lymph: no adenopathy Neck: no JVD Endocrine:  No thryomegaly Vascular: No carotid bruits; FA pulses 2+ bilaterally without bruits  Cardiac:  normal S1, S2; RRR; 3/6 SEM right sternal border Lungs:  clear to auscultation bilaterally, no wheezing, rhonchi or rales  Abd: soft, nontender, no hepatomegaly  Ext: no edema Musculoskeletal:  No deformities, BUE and BLE strength normal and equal Skin: warm and dry  Neuro:  CNs 2-12 intact, no focal abnormalities noted Psych:  Normal affect   EKG:  The EKG was personally reviewed and demonstrates: Atrial fibrillation/flutter, ventricularly paced rhythm rate 61 bpm Telemetry:  Telemetry was personally reviewed and demonstrates: Paced rhythm  Relevant CV Studies:  Echocardiogram personally reviewed by myself showing normal LV systolic function, moderately elevated right heart pressures Severe aortic valve stenosis  Laboratory Data:  Chemistry Recent Labs  Lab 03/23/17 2109 03/24/17 0510  NA 140 141  K 3.0* 3.4*  CL 102 104  CO2 27 30  GLUCOSE 134* 120*  BUN 10 10  CREATININE 1.06* 0.89  CALCIUM 8.6* 8.3*  GFRNONAA 45* 56*  GFRAA 53* >60  ANIONGAP 11 7    No results for input(s): PROT, ALBUMIN, AST, ALT, ALKPHOS, BILITOT in the last 168 hours. Hematology Recent Labs  Lab 03/23/17 2109 03/24/17 0510  WBC 7.3 7.4  RBC 4.13 4.14  HGB 12.3 12.5  HCT 36.1 36.0  MCV 87.5 87.2  MCH 29.9 30.2  MCHC 34.2 34.6  RDW 14.9* 14.7*  PLT 151 148*   Cardiac Enzymes Recent Labs  Lab 03/23/17 2109 03/24/17 0510 03/24/17 1044  TROPONINI <0.03 0.03* 0.03*   No results for input(s): TROPIPOC in the last 168 hours.  BNP Recent Labs  Lab 03/23/17 2109  BNP 333.0*    DDimer No results for input(s): DDIMER in the last 168 hours.  Radiology/Studies:  Dg Chest Port 1 View  Result Date: 03/23/2017 CLINICAL DATA:  Shortness of breath and weakness since last night. Sweating to the back of head and neck. EXAM:  PORTABLE CHEST 1 VIEW COMPARISON:  03/06/2016 FINDINGS: Cardiac pacemaker. Cardiac enlargement with mild pulmonary vascular congestion. Perihilar and basilar interstitial pattern likely represents interstitial edema. No focal consolidation. No blunting of  costophrenic angles. No pneumothorax. Calcification of the aorta. Degenerative changes in the spine and shoulders. IMPRESSION: Cardiac enlargement with mild vascular congestion and interstitial edema in the lungs. No focal consolidation or effusion. Aortic atherosclerosis. Electronically Signed   By: Lucienne Capers M.D.   On: 03/23/2017 20:32    Assessment and Plan:   1. Torsade de pointes Likely causing her diaphoresis and weakness episodes Case reviewed with Dr. Caryl Comes, EP Will replete potassium, check magnesium and replete if needed Medications reviewed, no new medications noted Will discuss medications with pharmacy Given frequent ectopy/PVCs, Dr. Caryl Comes has suggested pacer changes, increased ventricular rate -She does have known PAD, carotid disease, unable to exclude ischemia Given cardiac enzymes negative x2, normal ejection fraction, will hold on cardiac catheterization at this time (though this will be needed in the near future for workup of her aortic valve stenosis)  2) severe aortic valve stenosis Progression in the last year, now mean gradient high 40s, 50 with peak velocity some estimation 430 up to 500 cm/s Will require cardiac catheterization, either this admission or at a later date Could consider transesophageal echo at a later date  3) Tachybradycardia syndrome  Has pacemaker, followed in [place]  4) permanent atrial fibrillation/flutter On anticoagulation Felt to be asymptomatic at baseline  5) hypokalemia We will replete aggressively, we have ordered K 40 mg x1 We will also check magnesium  6) chronic diastolic CHF Moderately elevated right heart pressures on echocardiogram Recommend we increase torsemide up  to 20 mill grams twice daily with potassium supplement daily   Total encounter time more than 110 minutes  Greater than 50% was spent in counseling and coordination of care with the patient   For questions or updates, please contact [facility] Please consult [email or web address] for contact info under Cardiology/STEMI.   Signed, Ida Rogue, MD  03/24/2017 1:43 PM

## 2017-03-25 DIAGNOSIS — E876 Hypokalemia: Secondary | ICD-10-CM

## 2017-03-25 LAB — BASIC METABOLIC PANEL
ANION GAP: 8 (ref 5–15)
BUN: 16 mg/dL (ref 6–20)
CHLORIDE: 106 mmol/L (ref 101–111)
CO2: 29 mmol/L (ref 22–32)
Calcium: 8.3 mg/dL — ABNORMAL LOW (ref 8.9–10.3)
Creatinine, Ser: 1.29 mg/dL — ABNORMAL HIGH (ref 0.44–1.00)
GFR calc Af Amer: 42 mL/min — ABNORMAL LOW (ref >60)
GFR calc non Af Amer: 36 mL/min — ABNORMAL LOW (ref >60)
Glucose, Bld: 117 mg/dL — ABNORMAL HIGH (ref 65–99)
POTASSIUM: 3.2 mmol/L — AB (ref 3.5–5.1)
SODIUM: 143 mmol/L (ref 135–145)

## 2017-03-25 LAB — CBC
HCT: 34.1 % — ABNORMAL LOW (ref 35.0–47.0)
HEMOGLOBIN: 11.4 g/dL — AB (ref 12.0–16.0)
MCH: 29.4 pg (ref 26.0–34.0)
MCHC: 33.4 g/dL (ref 32.0–36.0)
MCV: 88.2 fL (ref 80.0–100.0)
Platelets: 150 10*3/uL (ref 150–440)
RBC: 3.86 MIL/uL (ref 3.80–5.20)
RDW: 15 % — ABNORMAL HIGH (ref 11.5–14.5)
WBC: 5.9 10*3/uL (ref 3.6–11.0)

## 2017-03-25 LAB — PROTIME-INR
INR: 1.39
PROTHROMBIN TIME: 16.9 s — AB (ref 11.4–15.2)

## 2017-03-25 LAB — APTT
APTT: 34 s (ref 24–36)
aPTT: 56 seconds — ABNORMAL HIGH (ref 24–36)

## 2017-03-25 LAB — TSH: TSH: 13.881 u[IU]/mL — AB (ref 0.350–4.500)

## 2017-03-25 LAB — HEPARIN LEVEL (UNFRACTIONATED): HEPARIN UNFRACTIONATED: 3.18 [IU]/mL — AB (ref 0.30–0.70)

## 2017-03-25 MED ORDER — HEPARIN (PORCINE) IN NACL 100-0.45 UNIT/ML-% IJ SOLN
1500.0000 [IU]/h | INTRAMUSCULAR | Status: DC
  Administered 2017-03-25: 1300 [IU]/h via INTRAVENOUS
  Administered 2017-03-26: 1400 [IU]/h via INTRAVENOUS
  Filled 2017-03-25 (×3): qty 250

## 2017-03-25 MED ORDER — POTASSIUM CHLORIDE CRYS ER 20 MEQ PO TBCR
40.0000 meq | EXTENDED_RELEASE_TABLET | Freq: Once | ORAL | Status: AC
  Administered 2017-03-25: 40 meq via ORAL
  Filled 2017-03-25: qty 2

## 2017-03-25 MED ORDER — MAGNESIUM OXIDE 400 (241.3 MG) MG PO TABS
400.0000 mg | ORAL_TABLET | Freq: Every day | ORAL | Status: DC
  Administered 2017-03-25 – 2017-03-27 (×3): 400 mg via ORAL
  Filled 2017-03-25 (×3): qty 1

## 2017-03-25 MED ORDER — HEPARIN BOLUS VIA INFUSION
2300.0000 [IU] | Freq: Once | INTRAVENOUS | Status: AC
  Administered 2017-03-25: 2300 [IU] via INTRAVENOUS
  Filled 2017-03-25: qty 2300

## 2017-03-25 NOTE — Progress Notes (Signed)
[place] for Heparin  Indication: atrial fibrillation  Allergies  Allergen Reactions  . Penicillins Hives and Other (See Comments)    Has patient had a PCN reaction causing immediate rash, facial/tongue/throat swelling, SOB or lightheadedness with hypotension: Yes Has patient had a PCN reaction causing severe rash involving mucus membranes or skin necrosis: No Has patient had a PCN reaction that required hospitalization: No Has patient had a PCN reaction occurring within the last 10 years: No If all of the above answers are "NO", then may proceed with Cephalosporin use.   . Sulfa Antibiotics Hives    Patient Measurements: Height: 6' (182.9 cm) Weight: 204 lb (92.5 kg) IBW/kg (Calculated) : 73.1  Vital Signs: Temp: 97.9 F (36.6 C) (01/02 2018) Temp Source: Oral (01/02 2018) BP: 146/77 (01/02 2018) Pulse Rate: 66 (01/02 2018)  Labs: Recent Labs    03/23/17 2109 03/24/17 0510 03/24/17 1044 03/25/17 0334 03/25/17 0921 03/25/17 2016  HGB 12.3 12.5  --  11.4*  --   --   HCT 36.1 36.0  --  34.1*  --   --   PLT 151 148*  --  150  --   --   APTT  --   --   --   --  34 56*  LABPROT  --   --   --   --  16.9*  --   INR  --   --   --   --  1.39  --   HEPARINUNFRC  --   --   --   --  3.18*  --   CREATININE 1.06* 0.89  --  1.29*  --   --   TROPONINI <0.03 0.03* 0.03*  --   --   --     Estimated Creatinine Clearance: 38.5 mL/min (A) (by C-G formula based on SCr of 1.29 mg/dL (H)).   Medical History: Past Medical History:  Diagnosis Date  . A-fib ([place])   . Angina at rest [facility])   . Anxiety   . Aortic valve stenosis   . Bilateral edema of lower extremity   . Bradycardia   . Carotid artery occlusion   . CHF (congestive heart failure) ([place])   . GERD (gastroesophageal reflux disease)   . Heart murmur   . High cholesterol   . Hypertension   . Obesity   . Osteoarthritis   . Presence of permanent cardiac pacemaker   . PVD (peripheral  vascular disease) ([place])   . SOB (shortness of breath)   . Stomach cancer ([place]) ~ 2013  . Vertigo     Assessment: 84 y/o F with a h/o tachybrady syndrome and PAF on Eliquis admitted with Torsade de pointes. Orders to transition to heparin drip for possible cath. Last dose of Eliquis 1/1 PM.  Goal of Therapy:  APTT 66-102 s Heparin level 0.3-0.7 units/ml Monitor platelets by anticoagulation protocol: Yes   Plan:  Will check initial HL/aPTT/INR and need to dose off aPTT until levels correlate.  Give 2300 units bolus x 1 (half bolus due to Eliquis transition) Start heparin infusion at 1300 units/hr Check anti-Xa/aPTT level in 8 hours and daily while on heparin Continue to monitor H&H and platelets  1/2 @ 20:16 :   APTT = 56 Will increase heparin drip to 1400 units/hr and recheck aPTT 6 hrs after rate change.   Pietra Zuluaga D 03/25/2017,9:11 PM

## 2017-03-25 NOTE — Progress Notes (Signed)
[place] [facility] [place] NAME: Theresa Cain    MR#:  [id number]  DATE OF BIRTH:  1926-10-17  SUBJECTIVE:  CHIEF COMPLAINT:   Chief Complaint  Patient presents with  . Weakness  . Shortness of Breath  very pleasant, sitting in bed. All family at bedside REVIEW OF SYSTEMS:  Review of Systems  Constitutional: Positive for malaise/fatigue. Negative for chills, fever and weight loss.  HENT: Negative for nosebleeds and sore throat.   Eyes: Negative for blurred vision.  Respiratory: Negative for cough, shortness of breath and wheezing.   Cardiovascular: Negative for chest pain, orthopnea, leg swelling and PND.  Gastrointestinal: Negative for abdominal pain, constipation, diarrhea, heartburn, nausea and vomiting.  Genitourinary: Negative for dysuria and urgency.  Musculoskeletal: Negative for back pain.  Skin: Negative for rash.  Neurological: Positive for weakness. Negative for dizziness, speech change, focal weakness and headaches.  Endo/Heme/Allergies: Does not bruise/bleed easily.  Psychiatric/Behavioral: Negative for depression.   DRUG ALLERGIES:   Allergies  Allergen Reactions  . Penicillins Hives and Other (See Comments)    Has patient had a PCN reaction causing immediate rash, facial/tongue/throat swelling, SOB or lightheadedness with hypotension: Yes Has patient had a PCN reaction causing severe rash involving mucus membranes or skin necrosis: No Has patient had a PCN reaction that required hospitalization: No Has patient had a PCN reaction occurring within the last 10 years: No If all of the above answers are "NO", then may proceed with Cephalosporin use.   . Sulfa Antibiotics Hives   VITALS:  Blood pressure (!) 146/64, pulse 62, temperature 98.2 F (36.8 C), temperature source Oral, resp. rate 18, height 6' (1.829 m), weight 92.5 kg (204 lb), SpO2 93 %. PHYSICAL EXAMINATION:  Physical Exam  Constitutional: She is  oriented to person, place, and time and well-developed, well-nourished, and in no distress.  HENT:  Head: Normocephalic and atraumatic.  Eyes: Conjunctivae and EOM are normal. Pupils are equal, round, and reactive to light.  Neck: Normal range of motion. Neck supple. No tracheal deviation present. No thyromegaly present.  Cardiovascular: Normal rate, regular rhythm and normal heart sounds.  Pulmonary/Chest: Effort normal and breath sounds normal. No respiratory distress. She has no wheezes. She exhibits no tenderness.  Abdominal: Soft. Bowel sounds are normal. She exhibits no distension. There is no tenderness.  Musculoskeletal: Normal range of motion.  Neurological: She is alert and oriented to person, place, and time. No cranial nerve deficit.  Skin: Skin is warm and dry. No rash noted.  Psychiatric: Mood and affect normal.   LABORATORY PANEL:  Female CBC Recent Labs  Lab 03/25/17 0334  WBC 5.9  HGB 11.4*  HCT 34.1*  PLT 150   ------------------------------------------------------------------------------------------------------------------ Chemistries  Recent Labs  Lab 03/24/17 1044 03/25/17 0334  NA  --  143  K  --  3.2*  CL  --  106  CO2  --  29  GLUCOSE  --  117*  BUN  --  16  CREATININE  --  1.29*  CALCIUM  --  8.3*  MG 1.9  --    RADIOLOGY:  No results found. ASSESSMENT AND PLAN:  84 y.o. female with a hx of tachybradycardia syndrome, permanent atrial fibrillation Chronic diastolic CHF, moderate to severe aortic valve stenosis admitted for diaphoresis, weakness and EKG abnormality  * VT/Torsade de pointes - continue Aggressive electrolyte replacement to keep potassium close to 4 and mag close to 2.0 -  Pharmacy managing this. - pending adjustment of pacer rate  - plan for cath on 1/4 per cardio  * Chronic diastolic CHF - continue torsemide up to 20 mill grams twice daily with potassium supplement daily per cardio  * Severe aortic valve stenosis -Repeat  echo, cardiology following  * Permanent atrial fibrillation/flutter - Holding Eliquis, starting Heparin drip for anticoagulation -Rate controlled by Cardizem CD 180 mg daily  * hypokalemia: K 3.2 this am -Replete and recheck  * HTN (hypertension) -remains elevated -Continue Cardizem and torsemide as above -Hydralazine needed for better blood pressure control  * GERD (gastroesophageal reflux disease) -home dose PPI  * Anxiety -home dose anxiolytics      All the records are reviewed and case discussed with Care Management/Social Worker. Management plans discussed with the patient, family and they are in agreement.  CODE STATUS: Full Code  TOTAL TIME TAKING CARE OF THIS PATIENT: 35 minutes.   More than 50% of the time was spent in counseling/coordination of care: YES  POSSIBLE D/C IN 3-4 DAYS, DEPENDING ON CLINICAL CONDITION & Cardiac Evaluation   Max Sane M.D on 03/25/2017 at 5:00 PM  Between 7am to 6pm - Pager - [phone]  After 6pm go to [email or web address] - Proofreader  [facility] [employer]  Office  [phone]  CC: Primary care physician; Kendrick Ranch, MD  Note: This dictation was prepared with Dragon dictation along with smaller phrase technology. Any transcriptional errors that result from this process are unintentional.

## 2017-03-25 NOTE — Progress Notes (Signed)
Progress Note  Patient Name: Theresa Cain Date of Encounter: 03/25/2017  Primary Cardiologist: Curt Bears  Subjective   No acute overnight events. No further ectopy. Awaiting EP consult. Potassium remains low at 3.2 this morning. Renal function has declined from 0.89-->1.29. Remains on Eliquis.   Inpatient Medications    Scheduled Meds: . apixaban  5 mg Oral BID  . diltiazem  180 mg Oral Daily  . escitalopram  10 mg Oral QHS  . gabapentin  600 mg Oral BID  . oxybutynin  10 mg Oral QHS  . pantoprazole  40 mg Oral Daily  . potassium chloride  40 mEq Oral Daily  . pravastatin  40 mg Oral QPM  . sodium chloride flush  3 mL Intravenous Q12H  . torsemide  20 mg Oral BID   Continuous Infusions:  PRN Meds: acetaminophen **OR** acetaminophen, hydrALAZINE, ondansetron **OR** ondansetron (ZOFRAN) IV   Vital Signs    Vitals:   03/24/17 1800 03/24/17 1929 03/25/17 0533 03/25/17 0752  BP:  135/60 (!) 165/68 (!) 145/60  Pulse:  61 62 62  Resp:  18 18   Temp:  98.8 F (37.1 C) 98 F (36.7 C) 98.2 F (36.8 C)  TempSrc:  Oral Oral Oral  SpO2: 97% 96% 95% 96%  Weight:   204 lb (92.5 kg)   Height:        Intake/Output Summary (Last 24 hours) at 03/25/2017 0818 Last data filed at 03/25/2017 0535 Gross per 24 hour  Intake 240 ml  Output 1100 ml  Net -860 ml   Filed Weights   03/23/17 2008 03/24/17 0021 03/25/17 0533  Weight: 205 lb (93 kg) 205 lb 4.8 oz (93.1 kg) 204 lb (92.5 kg)    Telemetry    n/a - Personally Reviewed  ECG    NSR without further arrhythmia - Personally Reviewed  Physical Exam   GEN: No acute distress.   Neck: No JVD. Cardiac: RRR, IV/VI systolic murmur RUSB, no rubs, or gallops.  Respiratory: Clear to auscultation bilaterally.  GI: Soft, nontender, non-distended.   MS: No edema; No deformity. Neuro:  Alert and oriented x 3; Nonfocal.  Psych: Normal affect.  Labs    Chemistry Recent Labs  Lab 03/23/17 2109 03/24/17 0510 03/25/17 0334  NA  140 141 143  K 3.0* 3.4* 3.2*  CL 102 104 106  CO2 27 30 29   GLUCOSE 134* 120* 117*  BUN 10 10 16   CREATININE 3.53* 0.89 1.29*  CALCIUM 8.6* 8.3* 8.3*  GFRNONAA 45* 56* 36*  GFRAA 53* >60 42*  ANIONGAP 11 7 8      Hematology Recent Labs  Lab 03/23/17 2109 03/24/17 0510 03/25/17 0334  WBC 7.3 7.4 5.9  RBC 4.13 4.14 3.86  HGB 12.3 12.5 11.4*  HCT 36.1 36.0 34.1*  MCV 87.5 87.2 88.2  MCH 29.9 30.2 29.4  MCHC 34.2 34.6 33.4  RDW 14.9* 14.7* 15.0*  PLT 151 148* 150    Cardiac Enzymes Recent Labs  Lab 03/23/17 2109 03/24/17 0510 03/24/17 1044  TROPONINI <0.03 0.03* 0.03*   No results for input(s): TROPIPOC in the last 168 hours.   BNP Recent Labs  Lab 03/23/17 2109  BNP 333.0*     DDimer No results for input(s): DDIMER in the last 168 hours.   Radiology    Dg Chest Port 1 View  Result Date: 03/23/2017 IMPRESSION: Cardiac enlargement with mild vascular congestion and interstitial edema in the lungs. No focal consolidation or effusion. Aortic atherosclerosis. Electronically Signed  By: Lucienne Capers M.D.   On: 03/23/2017 20:32    Cardiac Studies   TTE 03/24/2017: Study Conclusions  - Left ventricle: The cavity size was normal. There was mild   concentric hypertrophy. Systolic function was normal. The   estimated ejection fraction was in the range of 60% to 65%. Wall   motion was normal; there were no regional wall motion   abnormalities. The study is not technically sufficient to allow   evaluation of LV diastolic function. - Aortic valve: There was severe stenosis. Peak velocity (S): 448   cm/s. Mean gradient (S): 49 mm Hg. - Mitral valve: There was mild to moderate regurgitation. - Left atrium: The atrium was moderately dilated. - Right ventricle: Systolic function was normal. - Tricuspid valve: There was moderate regurgitation. - Pulmonary arteries: Systolic pressure was moderate to severely   elevated. PA peak pressure: 60 mm Hg  (S).  Impressions:  - Paced rhythm with underlying atrial fibrillation.  Patient Profile     84 y.o. female with history of permanent Afib on Eliquis, tachybrady syndrome s/p MDT PPM, chronic diastolic CHF, severe aortic valve stenosis, HTN, HLD, and PVD who presented to [facility] with weakness. Cardiology was asked to evaluate for VT/torsades and severe aortic stenosis.   Assessment & Plan    1. VT/torsades de pointes: -No further ectopy -Initially for LHC, though she has been continued on Eliquis, so this will have to be delayed until 1/4 to allow for appropriate wash out -Await EP consult for possible adjustment of device settings (on 03/26/17) -Replete potassium to goal > 4.0 -Replete magnesium to goal > 2.0 -Check TSH -Diltiazem -Echo as above  2. Severe aortic stenosis: -Wil need LHC, planned for 03/27/17 with Dr. Rockey Situ -Will need evaluation for replacement  3. Permanent Afib: -Ventricular rates controlled -Has been continued on Eliquis -Diltiazem  -Stop Eliquis (last dose PM of 03/24/17) -Start heparin gtt  4. Tachybrady syndrome: -Followed by EP -Status post PPM  5. Chronic diastolic CHF: -She does not appear grossly volume up -Continue PTA  6. Hypokalemia: -Replete to goal > 4.0  7. AKI: -Per IM   For questions or updates, please contact [facility] Please consult [email or web address] for contact info under Cardiology/STEMI.    Signed, Christell Faith, PA-C [place] Pager: [phone] 03/25/2017, 8:18 AM   Attending Note Patient seen and examined, agree with detailed note above,  Patient presentation and plan discussed on rounds.   No events overnight, Telemetry reviewed with no ventricular tachycardia Potassium low 3.2 despite repletion Echocardiogram showing elevated right heart pressures, now with severe aortic valve stenosis  On physical exam JVP 12+, lungs clear to auscultation bilaterally, heart sounds regular with 3/6 systolic ejection murmur  right sternal border abdomen soft nontender no significant lower extremity edema  Lab work reviewed showing potassium 3.2, TSH 13.8, hematocrit 34, creatinine 1.29 BUN 16   1. Torsade de pointes Likely causing her diaphoresis and weakness episodes  Dr. Caryl Comes, EP to see the patient tomorrow We will continue to replete potassium, K-Dur 40 x 2 today Given frequent ectopy/PVCs, Dr. Caryl Comes has suggested pacer changes, increased ventricular rate -She does have known PAD, carotid disease, unable to exclude ischemia Offered catheterization but she is on Eliquis.   Earliest possible date for cardiac catheterization would be Friday, January 4 I have reviewed the risks, indications, and alternatives to cardiac catheterization, possible angioplasty, and stenting with the patient. Risks include but are not limited to bleeding, infection, vascular injury, stroke, myocardial  infection, arrhythmia, kidney injury, radiation-related injury in the case of prolonged fluoroscopy use, emergency cardiac surgery, and death. The patient understands the risks of serious complication is 1-2 in 4707 with diagnostic cardiac cath and 1-2% or less with angioplasty/stenting.   2) severe aortic valve stenosis Progression in the last year, now mean gradient high 40s, 50 with peak velocity some estimation 430 up to 500 cm/s Will require cardiac catheterization, patient prefers to have this done this admission, we will hold Eliquis consider transesophageal echo at a later date if indicated  3) Tachybradycardia syndrome  Has pacemaker, followed in [place] EP to see tomorrow  4) permanent atrial fibrillation/flutter On anticoagulation.  Will hold for now Felt to be asymptomatic at baseline  5) hypokalemia We will replete aggressively goal 4.0 or higher,  Magnesium stable  6) chronic diastolic CHF Moderately elevated right heart pressures on echocardiogram  torsemide up to 20 mill grams twice daily with close  monitoring of renal function   Greater than 50% was spent in counseling and coordination of care with patient Total encounter time 35 minutes or more   Signed: Esmond Plants  M.D., Ph.D. [facility]

## 2017-03-25 NOTE — Progress Notes (Signed)
[place] for Heparin  Indication: atrial fibrillation  Allergies  Allergen Reactions  . Penicillins Hives and Other (See Comments)    Has patient had a PCN reaction causing immediate rash, facial/tongue/throat swelling, SOB or lightheadedness with hypotension: Yes Has patient had a PCN reaction causing severe rash involving mucus membranes or skin necrosis: No Has patient had a PCN reaction that required hospitalization: No Has patient had a PCN reaction occurring within the last 10 years: No If all of the above answers are "NO", then may proceed with Cephalosporin use.   . Sulfa Antibiotics Hives    Patient Measurements: Height: 6' (182.9 cm) Weight: 204 lb (92.5 kg) IBW/kg (Calculated) : 73.1  Vital Signs: Temp: 98.2 F (36.8 C) (01/02 0752) Temp Source: Oral (01/02 0752) BP: 145/60 (01/02 0752) Pulse Rate: 65 (01/02 0841)  Labs: Recent Labs    03/23/17 2109 03/24/17 0510 03/24/17 1044 03/25/17 0334  HGB 12.3 12.5  --  11.4*  HCT 36.1 36.0  --  34.1*  PLT 151 148*  --  150  CREATININE 1.06* 0.89  --  1.29*  TROPONINI <0.03 0.03* 0.03*  --     Estimated Creatinine Clearance: 38.5 mL/min (A) (by C-G formula based on SCr of 1.29 mg/dL (H)).   Medical History: Past Medical History:  Diagnosis Date  . A-fib ([place])   . Angina at rest [facility])   . Anxiety   . Aortic valve stenosis   . Bilateral edema of lower extremity   . Bradycardia   . Carotid artery occlusion   . CHF (congestive heart failure) ([place])   . GERD (gastroesophageal reflux disease)   . Heart murmur   . High cholesterol   . Hypertension   . Obesity   . Osteoarthritis   . Presence of permanent cardiac pacemaker   . PVD (peripheral vascular disease) ([place])   . SOB (shortness of breath)   . Stomach cancer ([place]) ~ 2013  . Vertigo     Assessment: 84 y/o F with a h/o tachybrady syndrome and PAF on Eliquis admitted with Torsade de pointes. Orders to transition to  heparin drip for possible cath. Last dose of Eliquis 1/1 PM.  Goal of Therapy:  APTT 66-102 s Heparin level 0.3-0.7 units/ml Monitor platelets by anticoagulation protocol: Yes   Plan:  Will check initial HL/aPTT/INR and need to dose off aPTT until levels correlate.  Give 2300 units bolus x 1 (half bolus due to Eliquis transition) Start heparin infusion at 1300 units/hr Check anti-Xa/aPTT level in 8 hours and daily while on heparin Continue to monitor H&H and platelets  Ulice Dash D 03/25/2017,9:23 AM

## 2017-03-25 NOTE — Plan of Care (Signed)
Patient is progressing appropriately. No current issues. Patient is awaiting cardiac catherization on Friday.

## 2017-03-26 LAB — BASIC METABOLIC PANEL
ANION GAP: 8 (ref 5–15)
BUN: 21 mg/dL — ABNORMAL HIGH (ref 6–20)
CALCIUM: 8.3 mg/dL — AB (ref 8.9–10.3)
CO2: 29 mmol/L (ref 22–32)
CREATININE: 1.37 mg/dL — AB (ref 0.44–1.00)
Chloride: 104 mmol/L (ref 101–111)
GFR calc Af Amer: 39 mL/min — ABNORMAL LOW (ref >60)
GFR, EST NON AFRICAN AMERICAN: 33 mL/min — AB (ref >60)
GLUCOSE: 113 mg/dL — AB (ref 65–99)
Potassium: 3.7 mmol/L (ref 3.5–5.1)
Sodium: 141 mmol/L (ref 135–145)

## 2017-03-26 LAB — CBC
HCT: 34.6 % — ABNORMAL LOW (ref 35.0–47.0)
Hemoglobin: 11.7 g/dL — ABNORMAL LOW (ref 12.0–16.0)
MCH: 30.1 pg (ref 26.0–34.0)
MCHC: 33.7 g/dL (ref 32.0–36.0)
MCV: 89.3 fL (ref 80.0–100.0)
PLATELETS: 137 10*3/uL — AB (ref 150–440)
RBC: 3.88 MIL/uL (ref 3.80–5.20)
RDW: 15.1 % — AB (ref 11.5–14.5)
WBC: 6.1 10*3/uL (ref 3.6–11.0)

## 2017-03-26 LAB — APTT
aPTT: 75 seconds — ABNORMAL HIGH (ref 24–36)
aPTT: 63 seconds — ABNORMAL HIGH (ref 24–36)

## 2017-03-26 LAB — T4, FREE: Free T4: 0.59 ng/dL — ABNORMAL LOW (ref 0.61–1.12)

## 2017-03-26 LAB — HEPARIN LEVEL (UNFRACTIONATED)
Heparin Unfractionated: 1.18 IU/mL — ABNORMAL HIGH (ref 0.30–0.70)
Heparin Unfractionated: 0.8 IU/mL — ABNORMAL HIGH (ref 0.30–0.70)

## 2017-03-26 NOTE — Progress Notes (Addendum)
Progress Note  Patient Name: Theresa Cain Date of Encounter: 03/26/2017  Primary Cardiologist: Curt Bears  Subjective   No acute overnight events. No chest pain or SOB. No further ectopy. Undergoing Eliquis washout for LHC on 1/4. On heparin gtt. SCr 1.29-->1.37. Potassium 3.2-->3.7.   Inpatient Medications    Scheduled Meds: . diltiazem  180 mg Oral Daily  . escitalopram  10 mg Oral QHS  . gabapentin  600 mg Oral BID  . magnesium oxide  400 mg Oral Daily  . oxybutynin  10 mg Oral QHS  . pantoprazole  40 mg Oral Daily  . potassium chloride  40 mEq Oral Daily  . pravastatin  40 mg Oral QPM  . sodium chloride flush  3 mL Intravenous Q12H  . torsemide  20 mg Oral BID   Continuous Infusions: . heparin 1,400 Units/hr (03/26/17 0421)   PRN Meds: acetaminophen **OR** acetaminophen, hydrALAZINE, ondansetron **OR** ondansetron (ZOFRAN) IV   Vital Signs    Vitals:   03/25/17 1650 03/25/17 2018 03/26/17 0557 03/26/17 0756  BP: (!) 146/64 (!) 146/77 (!) 148/62 137/63  Pulse: 62 66 (!) 51 60  Resp:  18 18   Temp: 98.2 F (36.8 C) 97.9 F (36.6 C) (!) 97.5 F (36.4 C) 98 F (36.7 C)  TempSrc: Oral Oral Oral Oral  SpO2: 93% 98% 92% 97%  Weight:   202 lb 4.8 oz (91.8 kg)   Height:        Intake/Output Summary (Last 24 hours) at 03/26/2017 1118 Last data filed at 03/26/2017 1055 Gross per 24 hour  Intake 738.5 ml  Output 2900 ml  Net -2161.5 ml   Filed Weights   03/24/17 0021 03/25/17 0533 03/26/17 0557  Weight: 205 lb 4.8 oz (93.1 kg) 204 lb (92.5 kg) 202 lb 4.8 oz (91.8 kg)    Telemetry    Paced, no further ectopy - Personally Reviewed  ECG    n/a - Personally Reviewed  Physical Exam   GEN: No acute distress.   Neck: No JVD. Cardiac: RRR, IV/VI systolic murmur RUSB, no rubs, or gallops.  Respiratory: Clear to auscultation bilaterally.  GI: Soft, nontender, non-distended.   MS: No edema; No deformity. Neuro:  Alert and oriented x 3; Nonfocal.  Psych: Normal  affect.  Labs    Chemistry Recent Labs  Lab 03/24/17 0510 03/25/17 0334 03/26/17 0502  NA 141 143 141  K 3.4* 3.2* 3.7  CL 104 106 104  CO2 30 29 29   GLUCOSE 120* 117* 113*  BUN 10 16 21*  CREATININE 0.89 1.29* 1.37*  CALCIUM 8.3* 8.3* 8.3*  GFRNONAA 56* 36* 33*  GFRAA >60 42* 39*  ANIONGAP 7 8 8      Hematology Recent Labs  Lab 03/24/17 0510 03/25/17 0334 03/26/17 0502  WBC 7.4 5.9 6.1  RBC 4.14 3.86 3.88  HGB 12.5 11.4* 11.7*  HCT 36.0 34.1* 34.6*  MCV 87.2 88.2 89.3  MCH 30.2 29.4 30.1  MCHC 34.6 33.4 33.7  RDW 14.7* 15.0* 15.1*  PLT 148* 150 137*    Cardiac Enzymes Recent Labs  Lab 03/23/17 2109 03/24/17 0510 03/24/17 1044  TROPONINI <0.03 0.03* 0.03*   No results for input(s): TROPIPOC in the last 168 hours.   BNP Recent Labs  Lab 03/23/17 2109  BNP 333.0*     DDimer No results for input(s): DDIMER in the last 168 hours.   Radiology    No results found.  Cardiac Studies   TTE 03/24/2017: Study Conclusions  -  Left ventricle: The cavity size was normal. There was mild   concentric hypertrophy. Systolic function was normal. The   estimated ejection fraction was in the range of 60% to 65%. Wall   motion was normal; there were no regional wall motion   abnormalities. The study is not technically sufficient to allow   evaluation of LV diastolic function. - Aortic valve: There was severe stenosis. Peak velocity (S): 448   cm/s. Mean gradient (S): 49 mm Hg. - Mitral valve: There was mild to moderate regurgitation. - Left atrium: The atrium was moderately dilated. - Right ventricle: Systolic function was normal. - Tricuspid valve: There was moderate regurgitation. - Pulmonary arteries: Systolic pressure was moderate to severely   elevated. PA peak pressure: 60 mm Hg (S).  Impressions:  - Paced rhythm with underlying atrial fibrillation.  Patient Profile     84 y.o. female with history of permanent Afib on Eliquis, tachybrady syndrome  s/p MDT PPM, chronic diastolic CHF, severe aortic valve stenosis, HTN, HLD, and PVD who presented to [facility] with weakness. Cardiology was asked to evaluate for VT/torsades and severe aortic stenosis.  Assessment & Plan    1. VT/torsades de pointes: -No further ectopy -Initially for LHC, though she has been continued on Eliquis, so this will have to be delayed until 1/4 to allow for appropriate wash out -Await EP consult for possible adjustment of device settings -Replete potassium to goal > 4.0 -Replete magnesium to goal > 2.0 -Check TSH -Diltiazem -Echo as above  2. Severe aortic stenosis: -Wil need LHC, planned for 03/27/17 with Dr. Rockey Situ -Will need evaluation for replacement  3. Permanent Afib: -Ventricular rates controlled -Was continued on Eliquis upon admission -Diltiazem  -Stop Eliquis (last dose PM of 03/24/17) in preparation for LHC on 1/4 -Continue heparin gtt  4. Tachybrady syndrome: -Followed by EP -Status post PPM  5. Chronic diastolic CHF: -She does not appear grossly volume up -Continue PTA  6. Hypokalemia: -Replete to goal > 4.0  7. AKI: -Per IM -Recommend gentle IV hydration prior to [facility] on 1/4    For questions or updates, please contact [place] Please consult [email or web address] for contact info under Cardiology/STEMI.    Signed, Christell Faith, PA-C [place] Pager: [phone] 03/26/2017, 11:18 AM  Attending Note Patient seen and examined, agree with detailed note above,  Patient presentation and plan discussed on rounds.   No events overnight,  Telemetry reviewed with no significant arrhythmia Denies any significant shortness of breath or chest discomfort No flushing, diaphoresis  On exam no JVD, lungs clear to auscultation bilaterally heart sounds irregularly irregular no murmurs appreciated abdomen soft nontender no significant lower extremity edema  Lab work reviewed potassium 3.7, creatinine 1.37 with BUN 21, hematocrit  34  A/P Torsades de pointes No clear offending agent/medication No further arrhythmia except for as seen on morning after admission Discussed with EP PVCs have essentially resolved with improvement of her potassium level Would continue to aggressively replete potassium with her torsemide, goal potassium 4 or higher Cardiac catheterization scheduled for tomorrow to rule out ischemia  Aortic valve stenosis Severe disease on echocardiogram, significant progression compared to last year early 2017 Cardiac catheterization scheduled tomorrow in preparation for surgery at a later date Potentially might be a candidate for TAVR   Greater than 50% was spent in counseling and coordination of care with patient Total encounter time 25 minutes or more   Signed: Esmond Plants  M.D., Ph.D. [facility]

## 2017-03-26 NOTE — Progress Notes (Addendum)
[place] for Heparin  Indication: atrial fibrillation  Allergies  Allergen Reactions  . Penicillins Hives and Other (See Comments)    Has patient had a PCN reaction causing immediate rash, facial/tongue/throat swelling, SOB or lightheadedness with hypotension: Yes Has patient had a PCN reaction causing severe rash involving mucus membranes or skin necrosis: No Has patient had a PCN reaction that required hospitalization: No Has patient had a PCN reaction occurring within the last 10 years: No If all of the above answers are "NO", then may proceed with Cephalosporin use.   . Sulfa Antibiotics Hives    Patient Measurements: Height: 6' (182.9 cm) Weight: 202 lb 4.8 oz (91.8 kg) IBW/kg (Calculated) : 73.1  Vital Signs: Temp: 98 F (36.7 C) (01/03 0756) Temp Source: Oral (01/03 0756) BP: 137/63 (01/03 0756) Pulse Rate: 60 (01/03 0756)  Labs: Recent Labs    03/23/17 2109 03/24/17 0510 03/24/17 1044 03/25/17 0334  03/25/17 0921 03/25/17 2016 03/26/17 0502 03/26/17 1341  HGB 12.3 12.5  --  11.4*  --   --   --  11.7*  --   HCT 36.1 36.0  --  34.1*  --   --   --  34.6*  --   PLT 151 148*  --  150  --   --   --  137*  --   APTT  --   --   --   --    < > 34 56* 75* 63*  LABPROT  --   --   --   --   --  16.9*  --   --   --   INR  --   --   --   --   --  1.39  --   --   --   HEPARINUNFRC  --   --   --   --   --  3.18*  --  1.18* 0.80*  CREATININE 1.06* 0.89  --  1.29*  --   --   --  1.37*  --   TROPONINI <0.03 0.03* 0.03*  --   --   --   --   --   --    < > = values in this interval not displayed.    Estimated Creatinine Clearance: 36.1 mL/min (A) (by C-G formula based on SCr of 1.37 mg/dL (H)).   Medical History: Past Medical History:  Diagnosis Date  . A-fib ([place])   . Angina at rest [facility])   . Anxiety   . Aortic valve stenosis   . Bilateral edema of lower extremity   . Bradycardia   . Carotid artery occlusion   . CHF  (congestive heart failure) ([place])   . GERD (gastroesophageal reflux disease)   . Heart murmur   . High cholesterol   . Hypertension   . Obesity   . Osteoarthritis   . Presence of permanent cardiac pacemaker   . PVD (peripheral vascular disease) ([place])   . SOB (shortness of breath)   . Stomach cancer ([place]) ~ 2013  . Vertigo     Assessment: 84 y/o F with a h/o tachybrady syndrome and PAF on Eliquis admitted with Torsade de pointes. Orders to transition to heparin drip for possible cath. Last dose of Eliquis 1/1 PM.  Goal of Therapy:  APTT 66-102 s Heparin level 0.3-0.7 units/ml Monitor platelets by anticoagulation protocol: Yes   Plan:  APTT=63, HL=0.8. Not correlating. Continue to dose off of APTT.  I  will bump heparin drip up to 1500 units/hr since APTT is dropping and is now borderline. Goal 62-102. Recheck in 8 hours.  Ramond Dial, Pharm.D, BCPS Clinical Pharmacist  03/26/2017

## 2017-03-26 NOTE — Progress Notes (Signed)
[facility] NAME: Theresa Cain    MR#:  [id number]  DATE OF BIRTH:  [birth year]  SUBJECTIVE:  CHIEF COMPLAINT:   Chief Complaint  Patient presents with  . Weakness  . Shortness of Breath  very pleasant, sitting in bed. Son in room.  REVIEW OF SYSTEMS:  Review of Systems  Constitutional: Positive for malaise/fatigue. Negative for chills, fever and weight loss.  HENT: Negative for nosebleeds and sore throat.   Eyes: Negative for blurred vision.  Respiratory: Negative for cough, shortness of breath and wheezing.   Cardiovascular: Negative for chest pain, orthopnea, leg swelling and PND.  Gastrointestinal: Negative for abdominal pain, constipation, diarrhea, heartburn, nausea and vomiting.  Genitourinary: Negative for dysuria and urgency.  Musculoskeletal: Negative for back pain.  Skin: Negative for rash.  Neurological: Positive for weakness. Negative for dizziness, speech change, focal weakness and headaches.  Endo/Heme/Allergies: Does not bruise/bleed easily.  Psychiatric/Behavioral: Negative for depression.   DRUG ALLERGIES:   Allergies  Allergen Reactions  . Penicillins Hives and Other (See Comments)    Has patient had a PCN reaction causing immediate rash, facial/tongue/throat swelling, SOB or lightheadedness with hypotension: Yes Has patient had a PCN reaction causing severe rash involving mucus membranes or skin necrosis: No Has patient had a PCN reaction that required hospitalization: No Has patient had a PCN reaction occurring within the last 10 years: No If all of the above answers are "NO", then may proceed with Cephalosporin use.   . Sulfa Antibiotics Hives   VITALS:  Blood pressure 137/63, pulse 60, temperature 98 F (36.7 C), temperature source Oral, resp. rate 18, height 6' (1.829 m), weight 91.8 kg (202 lb 4.8 oz), SpO2 97 %. PHYSICAL EXAMINATION:  Physical Exam  Constitutional: She is oriented to  person, place, and time and well-developed, well-nourished, and in no distress.  HENT:  Head: Normocephalic and atraumatic.  Eyes: Conjunctivae and EOM are normal. Pupils are equal, round, and reactive to light.  Neck: Normal range of motion. Neck supple. No tracheal deviation present. No thyromegaly present.  Cardiovascular: Normal rate, regular rhythm and normal heart sounds.  Pulmonary/Chest: Effort normal and breath sounds normal. No respiratory distress. She has no wheezes. She exhibits no tenderness.  Abdominal: Soft. Bowel sounds are normal. She exhibits no distension. There is no tenderness.  Musculoskeletal: Normal range of motion.  Neurological: She is alert and oriented to person, place, and time. No cranial nerve deficit.  Skin: Skin is warm and dry. No rash noted.  Psychiatric: Mood and affect normal.   LABORATORY PANEL:  Female CBC Recent Labs  Lab 03/26/17 0502  WBC 6.1  HGB 11.7*  HCT 34.6*  PLT 137*   ------------------------------------------------------------------------------------------------------------------ Chemistries  Recent Labs  Lab 03/24/17 1044  03/26/17 0502  NA  --    < > 141  K  --    < > 3.7  CL  --    < > 104  CO2  --    < > 29  GLUCOSE  --    < > 113*  BUN  --    < > 21*  CREATININE  --    < > 1.37*  CALCIUM  --    < > 8.3*  MG 1.9  --   --    < > = values in this interval not displayed.   RADIOLOGY:  No results found. ASSESSMENT AND PLAN:  84  y.o. female with a hx of tachybradycardia syndrome, permanent atrial fibrillation Chronic diastolic CHF, moderate to severe aortic valve stenosis admitted for diaphoresis, weakness and EKG abnormality  * VT/Torsade de pointes - continue Aggressive electrolyte replacement to keep potassium close to 4 and mag close to 2.0 - Pharmacy managing this. - pending adjustment of pacer rate  - plan for cath on 1/4 per cardio  TSh is high, check free T3, free T4.  * Chronic diastolic CHF - continue  torsemide up to 20 mill grams twice daily with potassium supplement daily per cardio  * Severe aortic valve stenosis -Repeat echo, cardiology following  * Permanent atrial fibrillation/flutter - Holding Eliquis, on Heparin drip for anticoagulation -Rate controlled by Cardizem CD 180 mg daily  * hypokalemia: K 3.2 this am -Replete and recheck  * HTN (hypertension) -remains elevated -Continue Cardizem and torsemide as above -Hydralazine needed for better blood pressure control  * GERD (gastroesophageal reflux disease) -home dose PPI  * Anxiety -home dose anxiolytics    All the records are reviewed and case discussed with Care Management/Social Worker. Management plans discussed with the patient, family and they are in agreement.  CODE STATUS: Full Code  TOTAL TIME TAKING CARE OF THIS PATIENT: 35 minutes.   More than 50% of the time was spent in counseling/coordination of care: YES  POSSIBLE D/C IN 1-2 DAYS, DEPENDING ON CLINICAL CONDITION & Cardiac Evaluation   Vaughan Basta M.D on 03/26/2017 at 1:43 PM  Between 7am to 6pm - Pager - [phone]  After 6pm go to [email or web address] - Proofreader  [facility]  Office  [phone]  CC: Primary care physician; Kendrick Ranch, MD  Note: This dictation was prepared with Dragon dictation along with smaller phrase technology. Any transcriptional errors that result from this process are unintentional.

## 2017-03-26 NOTE — Plan of Care (Signed)
  Clinical Measurements: Diagnostic test results will improve 03/26/2017 2049 - Progressing by Marylouise Stacks, RN   Activity: Risk for activity intolerance will decrease 03/26/2017 2049 - Progressing by Marylouise Stacks, RN   Education: Knowledge of General Education information will improve 03/26/2017 2049 - Progressing by Marylouise Stacks, RN   Safety: Ability to remain free from injury will improve Description Pt will remain injury free while under my care.  03/26/2017 2049 - Progressing by Marylouise Stacks, RN   Education: Ability to demonstrate management of disease process will improve 03/26/2017 2049 - Progressing by Marylouise Stacks, RN   Education: Ability to verbalize understanding of medication therapies will improve 03/26/2017 2049 - Progressing by Marylouise Stacks, RN   Activity: Capacity to carry out activities will improve 03/26/2017 2049 - Progressing by Marylouise Stacks, RN   Cardiac: Ability to achieve and maintain adequate cardiopulmonary perfusion will improve 03/26/2017 2049 - Progressing by Marylouise Stacks, RN   Education: Understanding of CV disease, CV risk reduction, and recovery process will improve 03/26/2017 2049 - Progressing by Marylouise Stacks, RN   Activity: Ability to return to baseline activity level will improve 03/26/2017 2049 - Progressing by Marylouise Stacks, RN   Cardiovascular: Ability to achieve and maintain adequate cardiovascular perfusion will improve 03/26/2017 2049 - Progressing by Marylouise Stacks, RN   Health Behavior/Discharge Planning: Ability to safely manage health-related needs after discharge will improve 03/26/2017 2049 - Progressing by Marylouise Stacks, RN   Health Behavior/Discharge Planning: Ability to safely manage health-related needs after discharge will improve 03/26/2017 2049 - Progressing by Marylouise Stacks, RN

## 2017-03-26 NOTE — Progress Notes (Signed)
[facility] for Heparin  Indication: atrial fibrillation  Allergies  Allergen Reactions  . Penicillins Hives and Other (See Comments)    Has patient had a PCN reaction causing immediate rash, facial/tongue/throat swelling, SOB or lightheadedness with hypotension: Yes Has patient had a PCN reaction causing severe rash involving mucus membranes or skin necrosis: No Has patient had a PCN reaction that required hospitalization: No Has patient had a PCN reaction occurring within the last 10 years: No If all of the above answers are "NO", then may proceed with Cephalosporin use.   . Sulfa Antibiotics Hives    Patient Measurements: Height: 6' (182.9 cm) Weight: 202 lb 4.8 oz (91.8 kg) IBW/kg (Calculated) : 73.1  Vital Signs: Temp: 97.5 F (36.4 C) (01/03 0557) Temp Source: Oral (01/03 0557) BP: 148/62 (01/03 0557) Pulse Rate: 51 (01/03 0557)  Labs: Recent Labs    03/23/17 2109 03/24/17 0510 03/24/17 1044 03/25/17 0334 03/25/17 0921 03/25/17 2016 03/26/17 0502  HGB 12.3 12.5  --  11.4*  --   --  11.7*  HCT 36.1 36.0  --  34.1*  --   --  34.6*  PLT 151 148*  --  150  --   --  137*  APTT  --   --   --   --  34 56* 75*  LABPROT  --   --   --   --  16.9*  --   --   INR  --   --   --   --  1.39  --   --   HEPARINUNFRC  --   --   --   --  3.18*  --   --   CREATININE 1.06* 0.89  --  1.29*  --   --  1.37*  TROPONINI <0.03 0.03* 0.03*  --   --   --   --     Estimated Creatinine Clearance: 36.1 mL/min (A) (by C-G formula based on SCr of 1.37 mg/dL (H)).   Medical History: Past Medical History:  Diagnosis Date  . A-fib ([place])   . Angina at rest [facility])   . Anxiety   . Aortic valve stenosis   . Bilateral edema of lower extremity   . Bradycardia   . Carotid artery occlusion   . CHF (congestive heart failure) ([place])   . GERD (gastroesophageal reflux disease)   . Heart murmur   . High cholesterol   . Hypertension   . Obesity   .  Osteoarthritis   . Presence of permanent cardiac pacemaker   . PVD (peripheral vascular disease) ([place])   . SOB (shortness of breath)   . Stomach cancer ([place]) ~ 2013  . Vertigo     Assessment: 84 y/o F with a h/o tachybrady syndrome and PAF on Eliquis admitted with Torsade de pointes. Orders to transition to heparin drip for possible cath. Last dose of Eliquis 1/1 PM.  Goal of Therapy:  APTT 66-102 s Heparin level 0.3-0.7 units/ml Monitor platelets by anticoagulation protocol: Yes   Plan:  Will check initial HL/aPTT/INR and need to dose off aPTT until levels correlate.  Give 2300 units bolus x 1 (half bolus due to Eliquis transition) Start heparin infusion at 1300 units/hr Check anti-Xa/aPTT level in 8 hours and daily while on heparin Continue to monitor H&H and platelets  1/2 @ 20:16 :   APTT = 56 Will increase heparin drip to 1400 units/hr and recheck aPTT 6 hrs after rate change.  01/03 @ 0500 aPTT 75 sec therapeutic. Will continue current rate and will recheck HL/aPTT @ 1300.  Tobie Lords, PharmD, BCPS Clinical Pharmacist 03/26/2017

## 2017-03-27 ENCOUNTER — Encounter: Payer: Self-pay | Admitting: Cardiovascular Disease

## 2017-03-27 ENCOUNTER — Encounter
Admission: EM | Disposition: A | Discharge status: Home / Self Care | Payer: Self-pay | Source: Home / Self Care | Attending: Internal Medicine

## 2017-03-27 ENCOUNTER — Other Ambulatory Visit: Payer: Self-pay | Admitting: *Deleted

## 2017-03-27 DIAGNOSIS — I25118 Atherosclerotic heart disease of native coronary artery with other forms of angina pectoris: Principal | ICD-10-CM

## 2017-03-27 DIAGNOSIS — I350 Nonrheumatic aortic (valve) stenosis: Secondary | ICD-10-CM

## 2017-03-27 DIAGNOSIS — I272 Pulmonary hypertension, unspecified: Secondary | ICD-10-CM

## 2017-03-27 HISTORY — PX: LEFT HEART CATH AND CORONARY ANGIOGRAPHY: CATH118249

## 2017-03-27 LAB — CBC
HCT: 36.8 % (ref 35.0–47.0)
HEMOGLOBIN: 12.2 g/dL (ref 12.0–16.0)
MCH: 29.7 pg (ref 26.0–34.0)
MCHC: 33.2 g/dL (ref 32.0–36.0)
MCV: 89.6 fL (ref 80.0–100.0)
PLATELETS: 139 10*3/uL — AB (ref 150–440)
RBC: 4.1 MIL/uL (ref 3.80–5.20)
RDW: 15.1 % — AB (ref 11.5–14.5)
WBC: 5.1 10*3/uL (ref 3.6–11.0)

## 2017-03-27 LAB — APTT
APTT: 72 s — AB (ref 24–36)
APTT: 85 s — AB (ref 24–36)

## 2017-03-27 LAB — T3, FREE: T3 FREE: 2.8 pg/mL (ref 2.0–4.4)

## 2017-03-27 LAB — HEPARIN LEVEL (UNFRACTIONATED): HEPARIN UNFRACTIONATED: 0.59 [IU]/mL (ref 0.30–0.70)

## 2017-03-27 SURGERY — LEFT HEART CATH AND CORONARY ANGIOGRAPHY
Anesthesia: Moderate Sedation

## 2017-03-27 MED ORDER — FENTANYL CITRATE (PF) 100 MCG/2ML IJ SOLN
INTRAMUSCULAR | Status: DC | PRN
  Administered 2017-03-27: 25 ug via INTRAVENOUS

## 2017-03-27 MED ORDER — HEPARIN (PORCINE) IN NACL 2-0.9 UNIT/ML-% IJ SOLN
INTRAMUSCULAR | Status: AC
  Filled 2017-03-27: qty 500

## 2017-03-27 MED ORDER — MIDAZOLAM HCL 2 MG/2ML IJ SOLN
INTRAMUSCULAR | Status: DC | PRN
  Administered 2017-03-27: 1 mg via INTRAVENOUS

## 2017-03-27 MED ORDER — SODIUM CHLORIDE 0.9% FLUSH: 3.0000 mL | INTRAVENOUS | Status: DC | PRN

## 2017-03-27 MED ORDER — POTASSIUM CHLORIDE ER 10 MEQ PO TBCR: 10.0000 meq | EXTENDED_RELEASE_TABLET | Freq: Two times a day (BID) | ORAL | 0 refills | Status: DC

## 2017-03-27 MED ORDER — SODIUM CHLORIDE 0.9 % IV SOLN: 250.0000 mL | INTRAVENOUS | Status: DC | PRN

## 2017-03-27 MED ORDER — IOPAMIDOL (ISOVUE-300) INJECTION 61%
INTRAVENOUS | Status: DC | PRN
  Administered 2017-03-27: 140 mL via INTRA_ARTERIAL

## 2017-03-27 MED ORDER — LEVOTHYROXINE SODIUM 50 MCG PO TABS: 50.0000 ug | ORAL_TABLET | Freq: Every day | ORAL | 0 refills | Status: AC

## 2017-03-27 MED ORDER — TORSEMIDE 20 MG PO TABS: 20.0000 mg | ORAL_TABLET | Freq: Two times a day (BID) | ORAL | 0 refills | Status: DC

## 2017-03-27 MED ORDER — ASPIRIN 81 MG PO CHEW
81.0000 mg | CHEWABLE_TABLET | Freq: Once | ORAL | Status: AC
  Administered 2017-03-27: 81 mg via ORAL
  Filled 2017-03-27: qty 1

## 2017-03-27 MED ORDER — SODIUM CHLORIDE 0.9% FLUSH: 3.0000 mL | Freq: Two times a day (BID) | INTRAVENOUS | Status: DC

## 2017-03-27 MED ORDER — FENTANYL CITRATE (PF) 100 MCG/2ML IJ SOLN
INTRAMUSCULAR | Status: AC
  Filled 2017-03-27: qty 2

## 2017-03-27 MED ORDER — SODIUM CHLORIDE 0.9 % IV SOLN
INTRAVENOUS | Status: DC
  Administered 2017-03-27: 08:00:00 via INTRAVENOUS

## 2017-03-27 MED ORDER — MIDAZOLAM HCL 2 MG/2ML IJ SOLN
INTRAMUSCULAR | Status: AC
  Filled 2017-03-27: qty 2

## 2017-03-27 MED ORDER — DILTIAZEM HCL ER COATED BEADS 180 MG PO CP24: 180.0000 mg | ORAL_CAPSULE | Freq: Every day | ORAL | 3 refills | Status: AC

## 2017-03-27 SURGICAL SUPPLY — 10 items
CATH INFINITI 5FR ANG PIGTAIL (CATHETERS) ×3 IMPLANT
CATH INFINITI 5FR JL4 (CATHETERS) ×3 IMPLANT
CATH INFINITI JR4 5F (CATHETERS) ×3 IMPLANT
DEVICE CLOSURE MYNXGRIP 5F (Vascular Products) ×3 IMPLANT
KIT MANI 3VAL PERCEP (MISCELLANEOUS) ×3 IMPLANT
NEEDLE PERC 18GX7CM (NEEDLE) ×3 IMPLANT
PACK CARDIAC CATH (CUSTOM PROCEDURE TRAY) ×3 IMPLANT
SHEATH AVANTI 5FR X 11CM (SHEATH) ×3 IMPLANT
WIRE EMERALD ST .035X150CM (WIRE) ×3 IMPLANT
WIRE GUIDERIGHT .035X150 (WIRE) ×3 IMPLANT

## 2017-03-27 NOTE — Progress Notes (Signed)
Progress Note  Patient Name: Theresa Cain Date of Encounter: 03/27/2017  Primary Cardiologist: Curt Bears  Subjective   No acute overnight events. No arrhythmia Feels well, no SOB or chest pain  Inpatient Medications    Scheduled Meds: . [MAR Hold] diltiazem  180 mg Oral Daily  . [MAR Hold] escitalopram  10 mg Oral QHS  . [MAR Hold] gabapentin  600 mg Oral BID  . [MAR Hold] magnesium oxide  400 mg Oral Daily  . [MAR Hold] oxybutynin  10 mg Oral QHS  . [MAR Hold] pantoprazole  40 mg Oral Daily  . [MAR Hold] potassium chloride  40 mEq Oral Daily  . [MAR Hold] pravastatin  40 mg Oral QPM  . [MAR Hold] sodium chloride flush  3 mL Intravenous Q12H  . sodium chloride flush  3 mL Intravenous Q12H  . [MAR Hold] torsemide  20 mg Oral BID   Continuous Infusions: . sodium chloride    . [START ON 03/28/2017] sodium chloride 10 mL/hr at 03/27/17 0803  . heparin 1,500 Units/hr (03/26/17 1549)   PRN Meds: sodium chloride, [MAR Hold] acetaminophen **OR** [MAR Hold] acetaminophen, [MAR Hold] hydrALAZINE, [MAR Hold] ondansetron **OR** [MAR Hold] ondansetron (ZOFRAN) IV, sodium chloride flush   Vital Signs    Vitals:   03/27/17 0452 03/27/17 0500 03/27/17 0755 03/27/17 0930  BP: (!) 146/70  (!) 155/77 (!) 159/65  Pulse: 64  78 61  Resp: 18  20 20   Temp: 97.7 F (36.5 C)  98.4 F (36.9 C)   TempSrc: Oral  Oral   SpO2: 96%  94% 94%  Weight:  206 lb 4 oz (93.6 kg) 206 lb (93.4 kg)   Height:   6' (1.829 m)     Intake/Output Summary (Last 24 hours) at 03/27/2017 0940 Last data filed at 03/27/2017 0600 Gross per 24 hour  Intake 816.18 ml  Output 1600 ml  Net -783.82 ml   Filed Weights   03/26/17 0557 03/27/17 0500 03/27/17 0755  Weight: 202 lb 4.8 oz (91.8 kg) 206 lb 4 oz (93.6 kg) 206 lb (93.4 kg)    Telemetry    Paced, no further ectopy - Personally Reviewed  ECG    n/a - Personally Reviewed  Physical Exam   GEN: No acute distress.   Neck: No JVD. Cardiac: RRR, IV/VI  systolic murmur RUSB, no rubs, or gallops.  Respiratory: Clear to auscultation bilaterally.  GI: Soft, nontender, non-distended.   MS: No edema; No deformity. Neuro:  Alert and oriented x 3; Nonfocal.  Psych: Normal affect.  Labs    Chemistry Recent Labs  Lab 03/24/17 0510 03/25/17 0334 03/26/17 0502  NA 141 143 141  K 3.4* 3.2* 3.7  CL 104 106 104  CO2 30 29 29   GLUCOSE 120* 117* 113*  BUN 10 16 21*  CREATININE 0.89 1.29* 1.37*  CALCIUM 8.3* 8.3* 8.3*  GFRNONAA 56* 36* 33*  GFRAA >60 42* 39*  ANIONGAP 7 8 8      Hematology Recent Labs  Lab 03/25/17 0334 03/26/17 0502 03/27/17 0728  WBC 5.9 6.1 5.1  RBC 3.86 3.88 4.10  HGB 11.4* 11.7* 12.2  HCT 34.1* 34.6* 36.8  MCV 88.2 89.3 89.6  MCH 29.4 30.1 29.7  MCHC 33.4 33.7 33.2  RDW 15.0* 15.1* 15.1*  PLT 150 137* 139*    Cardiac Enzymes Recent Labs  Lab 03/23/17 2109 03/24/17 0510 03/24/17 1044  TROPONINI <0.03 0.03* 0.03*   No results for input(s): TROPIPOC in the last 168 hours.  BNP Recent Labs  Lab 03/23/17 2109  BNP 333.0*     DDimer No results for input(s): DDIMER in the last 168 hours.   Radiology    No results found.  Cardiac Studies   TTE 03/24/2017: Study Conclusions  - Left ventricle: The cavity size was normal. There was mild   concentric hypertrophy. Systolic function was normal. The   estimated ejection fraction was in the range of 60% to 65%. Wall   motion was normal; there were no regional wall motion   abnormalities. The study is not technically sufficient to allow   evaluation of LV diastolic function. - Aortic valve: There was severe stenosis. Peak velocity (S): 448   cm/s. Mean gradient (S): 49 mm Hg. - Mitral valve: There was mild to moderate regurgitation. - Left atrium: The atrium was moderately dilated. - Right ventricle: Systolic function was normal. - Tricuspid valve: There was moderate regurgitation. - Pulmonary arteries: Systolic pressure was moderate to  severely   elevated. PA peak pressure: 60 mm Hg (S).  Impressions:  - Paced rhythm with underlying atrial fibrillation.  Patient Profile     84 y.o. female with history of permanent Afib on Eliquis, tachybrady syndrome s/p MDT PPM, chronic diastolic CHF, severe aortic valve stenosis, HTN, HLD, and PVD who presented to [facility] with weakness. Cardiology was asked to evaluate for VT/torsades and severe aortic stenosis.  Assessment & Plan    A/P Torsades de pointes No clear offending agent/medication No further arrhythmia except for as seen on morning after admission Discussed with EP Follow up with primary EP in [place], dr. Curt Bears Cardiac catheterization  to rule out ischemia today, results showing no significant stenosis, medical management  Aortic valve stenosis Severe disease on echocardiogram, significant progression compared to last year early 2017 Cardiac catheterization with distal RCA disease, discussed with her in detail might be a candidate for TAVR Discussed the process with her, we have placed a referral to [place] for evaluation  Chronic diastolic CHF Would d/c later today on torsemide 20 BID with potassium 20 daily Very elevated RVSP on echo BMP when she sees cardiology in [place] in 2 weeks   Greater than 50% was spent in counseling and coordination of care with patient Total encounter time 25 minutes or more   Signed: Esmond Plants  M.D., Ph.D. [facility]

## 2017-03-27 NOTE — Progress Notes (Signed)
[facility] for Heparin  Indication: atrial fibrillation  Allergies  Allergen Reactions  . Penicillins Hives and Other (See Comments)    Has patient had a PCN reaction causing immediate rash, facial/tongue/throat swelling, SOB or lightheadedness with hypotension: Yes Has patient had a PCN reaction causing severe rash involving mucus membranes or skin necrosis: No Has patient had a PCN reaction that required hospitalization: No Has patient had a PCN reaction occurring within the last 10 years: No If all of the above answers are "NO", then may proceed with Cephalosporin use.   . Sulfa Antibiotics Hives    Patient Measurements: Height: 6' (182.9 cm) Weight: 206 lb (93.4 kg) IBW/kg (Calculated) : 73.1  Vital Signs: Temp: 98.4 F (36.9 C) (01/04 0755) Temp Source: Oral (01/04 0755) BP: 155/77 (01/04 0755) Pulse Rate: 78 (01/04 0755)  Labs: Recent Labs    03/24/17 1044 03/25/17 0334  03/25/17 0921  03/26/17 0502 03/26/17 1341 03/26/17 2347 03/27/17 0728  HGB  --  11.4*  --   --   --  11.7*  --   --   --   HCT  --  34.1*  --   --   --  34.6*  --   --   --   PLT  --  150  --   --   --  137*  --   --   --   APTT  --   --   --  34   < > 75* 63* 72* 85*  LABPROT  --   --   --  16.9*  --   --   --   --   --   INR  --   --   --  1.39  --   --   --   --   --   HEPARINUNFRC  --   --    < > 3.18*  --  1.18* 0.80*  --  0.59  CREATININE  --  1.29*  --   --   --  1.37*  --   --   --   TROPONINI 0.03*  --   --   --   --   --   --   --   --    < > = values in this interval not displayed.    Estimated Creatinine Clearance: 36.4 mL/min (A) (by C-G formula based on SCr of 1.37 mg/dL (H)).   Medical History: Past Medical History:  Diagnosis Date  . A-fib ([place])   . Angina at rest [facility]   . Anxiety   . Aortic valve stenosis   . Bilateral edema of lower extremity   . Bradycardia   . Carotid artery occlusion   . CHF (congestive heart failure)  ([place])   . GERD (gastroesophageal reflux disease)   . Heart murmur   . High cholesterol   . Hypertension   . Obesity   . Osteoarthritis   . Presence of permanent cardiac pacemaker   . PVD (peripheral vascular disease) ([place])   . SOB (shortness of breath)   . Stomach cancer ([place]) ~ 2013  . Vertigo     Assessment: 84 y/o F with a h/o tachybrady syndrome and PAF on Eliquis admitted with Torsade de pointes. Orders to transition to heparin drip for possible cath. Last dose of Eliquis 1/1 PM.  Goal of Therapy:  APTT 66-102 s Heparin level 0.3-0.7 units/ml Monitor platelets by anticoagulation protocol: Yes  Plan:  APTT=63, HL=0.8. Not correlating. Continue to dose off of APTT.  I will bump heparin drip up to 1500 units/hr since APTT is dropping and is now borderline. Goal 62-102. Recheck in 8 hours.  01/03 @ 2347 aPTT 72 therapeutic. Will continue current rate and will recheck aPTT/HL @ 0700 since still not correlating, once they correlate will dose off of HL. CBC stable  1/4 at 0728 - HL 0.59, aPTT 85 - both within therapeutic range. Will continue at current rate. Pt currently off floor for procedure. Will need to follow up plan after LHC. No CBC this AM, will order add on CBC and CBC in AM.   Rayna Sexton, PharmD, BCPS Clinical Pharmacist 03/27/2017 8:31 AM

## 2017-03-27 NOTE — Progress Notes (Signed)
[facility] for Heparin  Indication: atrial fibrillation  Allergies  Allergen Reactions  . Penicillins Hives and Other (See Comments)    Has patient had a PCN reaction causing immediate rash, facial/tongue/throat swelling, SOB or lightheadedness with hypotension: Yes Has patient had a PCN reaction causing severe rash involving mucus membranes or skin necrosis: No Has patient had a PCN reaction that required hospitalization: No Has patient had a PCN reaction occurring within the last 10 years: No If all of the above answers are "NO", then may proceed with Cephalosporin use.   . Sulfa Antibiotics Hives    Patient Measurements: Height: 6' (182.9 cm) Weight: 202 lb 4.8 oz (91.8 kg) IBW/kg (Calculated) : 73.1  Vital Signs: Temp: 98 F (36.7 C) (01/03 2030) Temp Source: Oral (01/03 2030) BP: 144/89 (01/03 2030) Pulse Rate: 68 (01/03 2030)  Labs: Recent Labs    03/24/17 0510 03/24/17 1044 03/25/17 0334 03/25/17 0921  03/26/17 0502 03/26/17 1341 03/26/17 2347  HGB 12.5  --  11.4*  --   --  11.7*  --   --   HCT 36.0  --  34.1*  --   --  34.6*  --   --   PLT 148*  --  150  --   --  137*  --   --   APTT  --   --   --  34   < > 75* 63* 72*  LABPROT  --   --   --  16.9*  --   --   --   --   INR  --   --   --  1.39  --   --   --   --   HEPARINUNFRC  --   --   --  3.18*  --  1.18* 0.80*  --   CREATININE 0.89  --  1.29*  --   --  1.37*  --   --   TROPONINI 0.03* 0.03*  --   --   --   --   --   --    < > = values in this interval not displayed.    Estimated Creatinine Clearance: 36.1 mL/min (A) (by C-G formula based on SCr of 1.37 mg/dL (H)).   Medical History: Past Medical History:  Diagnosis Date  . A-fib ([place])   . Angina at rest [facility])   . Anxiety   . Aortic valve stenosis   . Bilateral edema of lower extremity   . Bradycardia   . Carotid artery occlusion   . CHF (congestive heart failure) ([place])   . GERD (gastroesophageal reflux  disease)   . Heart murmur   . High cholesterol   . Hypertension   . Obesity   . Osteoarthritis   . Presence of permanent cardiac pacemaker   . PVD (peripheral vascular disease) ([place])   . SOB (shortness of breath)   . Stomach cancer ([place]) ~ 2013  . Vertigo     Assessment: 84 y/o F with a h/o tachybrady syndrome and PAF on Eliquis admitted with Torsade de pointes. Orders to transition to heparin drip for possible cath. Last dose of Eliquis 1/1 PM.  Goal of Therapy:  APTT 66-102 s Heparin level 0.3-0.7 units/ml Monitor platelets by anticoagulation protocol: Yes   Plan:  APTT=63, HL=0.8. Not correlating. Continue to dose off of APTT.  I will bump heparin drip up to 1500 units/hr since APTT is dropping and is now borderline. Goal 62-102. Recheck  in 8 hours.  01/03 @ 2347 aPTT 72 therapeutic. Will continue current rate and will recheck aPTT/HL @ 0700 since still not correlating, once they correlate will dose off of HL. CBC stable  Tobie Lords, PharmD, BCPS Clinical Pharmacist 03/27/2017

## 2017-03-27 NOTE — Discharge Instructions (Signed)
Follow with [facility] cardiology in 2 weeks.

## 2017-03-27 NOTE — Care Management (Signed)
Cardiac cath findings will be managed medically.  She is being referred to [facility] for her aortic stenosis.  No discharge needs identified by members of the care team

## 2017-03-27 NOTE — Plan of Care (Signed)
  Progressing Education: Knowledge of General Education information will improve 03/27/2017 1114 - Progressing by Darrelyn Hillock, RN Clinical Measurements: Diagnostic test results will improve 03/27/2017 1114 - Progressing by Darrelyn Hillock, RN Activity: Risk for activity intolerance will decrease 03/27/2017 1114 - Progressing by Darrelyn Hillock, RN Safety: Ability to remain free from injury will improve Description Pt will remain injury free while under my care.  03/27/2017 1114 - Progressing by Darrelyn Hillock, RN Education: Ability to demonstrate management of disease process will improve 03/27/2017 1114 - Progressing by Darrelyn Hillock, RN Ability to verbalize understanding of medication therapies will improve 03/27/2017 1114 - Progressing by Darrelyn Hillock, RN Activity: Capacity to carry out activities will improve 03/27/2017 1114 - Progressing by Darrelyn Hillock, RN Cardiac: Ability to achieve and maintain adequate cardiopulmonary perfusion will improve 03/27/2017 1114 - Progressing by Darrelyn Hillock, RN Education: Understanding of CV disease, CV risk reduction, and recovery process will improve 03/27/2017 1114 - Progressing by Darrelyn Hillock, RN Activity: Ability to return to baseline activity level will improve 03/27/2017 1114 - Progressing by Darrelyn Hillock, RN Cardiovascular: Ability to achieve and maintain adequate cardiovascular perfusion will improve 03/27/2017 1114 - Progressing by Darrelyn Hillock, RN Vascular access site(s) Level 0-1 will be maintained 03/27/2017 1114 - Progressing by Darrelyn Hillock, [place] Behavior/Discharge Planning: Ability to safely manage health-related needs after discharge will improve 03/27/2017 1114 - Progressing by Darrelyn Hillock, RN

## 2017-03-27 NOTE — Care Management Important Message (Signed)
Important Message  Patient Details  Name: Genette Huertas MRN: [id number] Date of Birth: 05/18/1926   Medicare Important Message Given:  Yes Signed IM notice given    Katrina Stack, RN 03/27/2017, 12:13 PM

## 2017-03-27 NOTE — Discharge Summary (Signed)
[facility] NAME: Theresa Cain    MR#:  [id number]  DATE OF BIRTH:  Aug 09, 1926  DATE OF ADMISSION:  03/23/2017 ADMITTING PHYSICIAN: Lance Coon, MD  DATE OF DISCHARGE: 03/27/2017   PRIMARY CARE PHYSICIAN: Vasireddy, Lanetta Inch, MD    ADMISSION DIAGNOSIS:  Shortness of breath [R06.02]  DISCHARGE DIAGNOSIS:  Principal Problem:   Anginal equivalent (HCC) Active Problems:   Chronic diastolic CHF (congestive heart failure) (HCC)   HTN (hypertension)   Atrial fibrillation, chronic (HCC)   GERD (gastroesophageal reflux disease)   Anxiety   Torsades de pointes (HCC)   CAD  SECONDARY DIAGNOSIS:   Past Medical History:  Diagnosis Date  . A-fib ([place])   . Angina at rest [facility])   . Anxiety   . Aortic valve stenosis   . Bilateral edema of lower extremity   . Bradycardia   . Carotid artery occlusion   . CHF (congestive heart failure) ([place])   . GERD (gastroesophageal reflux disease)   . Heart murmur   . High cholesterol   . Hypertension   . Obesity   . Osteoarthritis   . Presence of permanent cardiac pacemaker   . PVD (peripheral vascular disease) ([place])   . SOB (shortness of breath)   . 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 Vertigo     HOSPITAL COURSE:   84 y.o.femalewith a hx of tachybradycardia syndrome, permanent atrial fibrillation Chronic diastolic CHF,moderate to severe aortic valve stenosis admitted fordiaphoresis, weakness and EKG abnormality  * VT/Torsade de pointes - continue Aggressive electrolyte replacement to keep potassium close to 4 and mag close to 2.0 - Pharmacy managing this. - cath done on 03/27/17- have CAD- medical management suggested. - TSh is high, checked free T3, free T4- slightly low- started on levothyroxine.  * Chronic diastolic CHF - continue torsemide up to 20 mill grams twice daily with potassium supplement daily per cardio  *Severe aortic valve stenosis -Repeat echo,  cardiology following - suggest to follow at [facility] for possible TAVR  * Permanent atrial fibrillation/flutter - Holding Eliquis, on Heparin drip for anticoagulation -Rate controlled by Cardizem CD 180 mg daily - resume eliquis from tomorrow- as she had cath.  *hypokalemia: K 3.2 this am -Replete and recheck- normal now.  * HTN (hypertension) -remains elevated -Continue Cardizem and torsemide as above -Hydralazine needed for better blood pressure control  *GERD (gastroesophageal reflux disease) -home dose PPI  *Anxiety -home dose anxiolytics     DISCHARGE CONDITIONS:   Stable.  CONSULTS OBTAINED:  Treatment Team:  Minna Merritts, MD  DRUG ALLERGIES:   Allergies  Allergen Reactions  . Penicillins Hives and Other (See Comments)    Has patient had a PCN reaction causing immediate rash, facial/tongue/throat swelling, SOB or lightheadedness with hypotension: Yes Has patient had a PCN reaction causing severe rash involving mucus membranes or skin necrosis: No Has patient had a PCN reaction that required hospitalization: No Has patient had a PCN reaction occurring within the last 10 years: No If all of the above answers are "NO", then may proceed with Cephalosporin use.   . Sulfa Antibiotics Hives    DISCHARGE MEDICATIONS:   Allergies as of 03/27/2017      Reactions   Penicillins Hives, Other (See Comments)   Has patient had a PCN reaction causing immediate rash, facial/tongue/throat swelling, SOB or lightheadedness with hypotension: Yes Has patient had a PCN reaction causing severe rash involving mucus membranes or skin  necrosis: No Has patient had a PCN reaction that required hospitalization: No Has patient had a PCN reaction occurring within the last 10 years: No If all of the above answers are "NO", then may proceed with Cephalosporin use.   Sulfa Antibiotics Hives    Med Rec must be completed prior to using this [facility]      DISCHARGE  INSTRUCTIONS:    Follow with cardiology at [facility] as advised.  If you experience worsening of your admission symptoms, develop shortness of breath, life threatening emergency, suicidal or homicidal thoughts you must seek medical attention immediately by calling 911 or calling your MD immediately  if symptoms less severe.  You Must read complete instructions/literature along with all the possible adverse reactions/side effects for all the Medicines you take and that have been prescribed to you. Take any new Medicines after you have completely understood and accept all the possible adverse reactions/side effects.   Please note  You were cared for by a hospitalist during your hospital stay. If you have any questions about your discharge medications or the care you received while you were in the hospital after you are discharged, you can call the unit and asked to speak with the hospitalist on call if the hospitalist that took care of you is not available. Once you are discharged, your primary care physician will handle any further medical issues. Please note that NO REFILLS for any discharge medications will be authorized once you are discharged, as it is imperative that you return to your primary care physician (or establish a relationship with a primary care physician if you do not have one) for your aftercare needs so that they can reassess your need for medications and monitor your lab values.  Today   CHIEF COMPLAINT:   Chief Complaint  Patient presents with  . Weakness  . Shortness of Breath    HISTORY OF PRESENT ILLNESS:  Theresa Cain  is a 84 y.o. female presents with a couple days of increasing weakness and episodes of diaphoresis and shortness of breath.  Patient has a pacemaker, and in the ED had some EKG changes.  These changes were somewhat nonspecific, though did involve some T wave changes from one EKG the next.  Given that her symptoms are consistent with anginal equivalent  and her EKG changes, hospitalist were called for admission and further evaluation.   VITAL SIGNS:  Blood pressure (!) 146/61, pulse (!) 58, temperature 97.6 F (36.4 C), temperature source Oral, resp. rate 18, height 6' (1.829 m), weight 93.4 kg (206 lb), SpO2 95 %.  I/O:    Intake/Output Summary (Last 24 hours) at 03/27/2017 1258 Last data filed at 03/27/2017 1201 Gross per 24 hour  Intake 576.18 ml  Output 1200 ml  Net -623.82 ml    PHYSICAL EXAMINATION:  Constitutional: She is oriented to person, place, and time and well-developed, well-nourished, and in no distress.  HENT:  Head: Normocephalic and atraumatic.  Eyes: Conjunctivae and EOM are normal. Pupils are equal, round, and reactive to light.  Neck: Normal range of motion. Neck supple. No tracheal deviation present. No thyromegaly present.  Cardiovascular: Normal rate, regular rhythm and normal heart sounds.  Pulmonary/Chest: Effort normal and breath sounds normal. No respiratory distress. She has no wheezes. She exhibits no tenderness.  Abdominal: Soft. Bowel sounds are normal. She exhibits no distension. There is no tenderness.  Musculoskeletal: Normal range of motion.  Neurological: She is alert and oriented to person, place, and time. No cranial nerve  deficit.  Skin: Skin is warm and dry. No rash noted.  Psychiatric: Mood and affect normal.     DATA REVIEW:   CBC Recent Labs  Lab 03/27/17 0728  WBC 5.1  HGB 12.2  HCT 36.8  PLT 139*    Chemistries  Recent Labs  Lab 03/24/17 1044  03/26/17 0502  NA  --    < > 141  K  --    < > 3.7  CL  --    < > 104  CO2  --    < > 29  GLUCOSE  --    < > 113*  BUN  --    < > 21*  CREATININE  --    < > 1.37*  CALCIUM  --    < > 8.3*  MG 1.9  --   --    < > = values in this interval not displayed.    Cardiac Enzymes Recent Labs  Lab 03/24/17 1044  TROPONINI 0.03*    Microbiology Results  Results for orders placed or performed during the hospital encounter of  03/04/16  Surgical pcr screen     Status: Abnormal   Collection Time: 03/04/16 12:15 PM  Result Value Ref Range Status   MRSA, PCR NEGATIVE NEGATIVE Final   Staphylococcus aureus POSITIVE (A) NEGATIVE Final    Comment:        The Xpert SA Assay (FDA approved for NASAL specimens in patients over 22 years of age), is one component of a comprehensive surveillance program.  Test performance has been validated by [facility] for patients greater than or equal to 34 year old. It is not intended to diagnose infection nor to guide or monitor treatment.     RADIOLOGY:  No results found.  EKG:   Orders placed or performed during the hospital encounter of 03/23/17  . ED EKG  . ED EKG  . EKG 12-Lead  . EKG 12-Lead      Management plans discussed with the patient, family and they are in agreement.  CODE STATUS:     Code Status Orders  (From admission, onward)        Start     Ordered   03/24/17 0025  Full code  Continuous     03/24/17 0024    Code Status History    Date Active Date Inactive Code Status Order ID Comments User Context   03/04/2016 17:25 03/06/2016 14:02 Full Code [id number]  Constance Haw, MD Inpatient   02/22/2016 22:19 02/23/2016 19:56 Full Code [id number]  Etta Quill, DO ED    Advance Directive Documentation     Most Recent Value  Type of Advance Directive  Living will  Pre-existing out of facility DNR order (yellow form or pink MOST form)  No data  "MOST" Form in Place?  No data      TOTAL TIME TAKING CARE OF THIS PATIENT: 35 minutes.    Vaughan Basta M.D on 03/27/2017 at 12:58 PM  Between 7am to 6pm - Pager - [phone]  After 6pm go to [email or web address] - password [email or web address] [facility]  Office  [phone]  CC: Primary care physician; Kendrick Ranch, MD   Note: This dictation was prepared with Dragon dictation along with smaller phrase technology. Any transcriptional errors that result  from this process are unintentional.

## 2017-03-27 NOTE — Progress Notes (Signed)
Patient is discharge home in a stable condition, right groin site good no bleeding, hematoma noted , pedal pulse palpable ,summary and f/u care given , verbalized understanding

## 2017-03-27 NOTE — Care Management (Signed)
Informed that patient was discharging on new Eliquis.  Patient was on Eliquis prior to this hospitalization

## 2017-03-31 ENCOUNTER — Encounter: Payer: Self-pay | Admitting: Physician Assistant

## 2017-03-31 NOTE — Progress Notes (Signed)
HEART AND [place]                                       Cardiology Office Note    Date:  04/01/2017   ID:  Theresa Cain, DOB 1927/03/22, MRN [id number]  PCP:  Kendrick Ranch, MD  Cardiologist: Dr. Rockey Situ / Dr. Curt Bears    CC: severe aortic stenosis- TAVR evaluation   History of Present Illness:  Theresa Cain is a 84 y.o. female who presents to clinic for evaluation of her severe aortic stenosis, referred by Dr. Rockey Situ.  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   [name] lives alone in her home in [place], [place]. Her husband passed 10 years. She has three living children who live very close. 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 She still drives. She remains very active doing a yard work and household chores. She has no known Nickel allergy. She has full top dentures and a partial bottom. She hasn't been to the dentist in over a year but practices good dental higene. She is a never smoker.   She has been told she has had a heart murmur for years. She was previously followed by Dr. Clayborn Bigness with the [facility]. She was last seen by him in 12/2015. She was referred to Dr. Curt Bears with EP after a Holter monitor revealed symptomatic bradycardia. She underwent PPM with a Medtronic Advisa L dual-chamber pacemaker and lead revision for atrial lead dislodgement the following day in 02/2016.   She was recently admitted to [facility] from 03/23/17-03/27/17. She presented to the emergency room with weakness and diaphoresis. Telemetry revealed sustained VT, torsade de pointes, frequent PVCs, short runs of nonsustained VT. Her potassium was noted to be low at 3.0. Dr. Caryl Comes reviewed the  case with Dr. Rockey Situ and recommended electrolyte repletion and reprogramming PPM for increased ventricular rates. She underwent L/RHC on 03/27/16 to rule out ischemia which showed mod non obstructive CAD ( 40% p-mid LAD, 75% dRCA and 40% post atrio stenosis). A 2D ECHO showed EF 60-65%, severe AS (mean gradient 49 mm Hg, peak velocity 448 cm/s), mild-mod MR, severe LAE, mod TR, PA pressure 60 mm Hg. She was noted to have pulmonary vascular congestion on her CXR and her torsemide was increased from 20mg  daily to BID. Given finding of severe AS she was referred to the multidisciplinary valve team for consideration of TAVR.  The patient presents today to discuss her aortic stenosis. She reports being very active but slowing down over the past year or so. She has noticed worsening fatigue, worsening LE edema, dyspnea on exertion, and dizziness on exertion. No syncope. She also occasionally notices chest tightness, but not always related to exertion. Just walking across the room on level ground now causes dyspnea. No orthopnea or PND. She has had several episodes of diaphoresis and chest tightness similar to her recent admission over the past few days. Given recent Torsades/VT I had her device interrogated which did not show any arrhythmia except her chronic afib.    Past Medical History:  Diagnosis Date  . Anxiety   . Bradycardia   . Carotid artery  occlusion   . Chronic diastolic CHF (congestive heart failure) ([place])   . GERD (gastroesophageal reflux disease)   . HLD (hyperlipidemia)   . Hypertension   . Obesity   . Osteoarthritis   . Permanent atrial fibrillation ([place])   . Presence of permanent cardiac pacemaker   . PVD (peripheral vascular disease) ([place])   . Severe aortic stenosis   . Stomach cancer ([place]) ~ 2013  . Vertigo     Past Surgical History:  Procedure Laterality Date  . CARDIOVASCULAR STRESS TEST    . CARDIOVERSION N/A 08/08/2016   Procedure: CARDIOVERSION;  Surgeon: Sueanne Margarita,  MD;  Location: [facility] ENDOSCOPY;  Service: Cardiovascular;  Laterality: N/A;  . CAROTID ENDARTERECTOMY Right ~ 2005  . CATARACT EXTRACTION W/ INTRAOCULAR LENS IMPLANT Right   . CHOLECYSTECTOMY  ~ 2013   "part of her [facility] OR"  . EP IMPLANTABLE DEVICE N/A 03/04/2016   Procedure: Pacemaker Implant;  Surgeon: Will Meredith Leeds, MD;  Location: [facility] CV LAB;  Service: Cardiovascular;  Laterality: N/A;  . EP IMPLANTABLE DEVICE N/A 03/05/2016   Procedure: Lead Revision/Repair;  Surgeon: Deboraha Sprang, MD;  Location: [place] CV LAB;  Service: Cardiovascular;  Laterality: N/A;  . INSERT / REPLACE / REMOVE PACEMAKER    . LEFT HEART CATH AND CORONARY ANGIOGRAPHY N/A 03/27/2017   Procedure: LEFT HEART CATH AND CORONARY ANGIOGRAPHY;  Surgeon: Minna Merritts, MD;  Location: [facility] CV LAB;  Service: Cardiovascular;  Laterality: N/A;  . US ECHOCARDIOGRAPHY    . VAGINAL HYSTERECTOMY    . WHIPPLE PROCEDURE  ~ 2013    Current Medications: Outpatient Medications Prior to Visit  Medication Sig Dispense Refill  . acetaminophen (TYLENOL) 500 MG tablet Take 1,000 mg by mouth every 6 (six) hours as needed for moderate pain.    Marland Kitchen apixaban (ELIQUIS) 5 MG TABS tablet Take 5 mg by mouth 2 (two) times daily.    Marland Kitchen diltiazem (CARDIZEM CD) 180 MG 24 hr capsule Take 1 capsule (180 mg total) by mouth daily. 90 capsule 3  . escitalopram (LEXAPRO) 10 MG tablet Take 10 mg by mouth at bedtime.     . ferrous sulfate 325 (65 FE) MG tablet Take 325 mg by mouth daily with breakfast.    . gabapentin (NEURONTIN) 600 MG tablet Take 600 mg by mouth 2 (two) times daily.     Marland Kitchen levothyroxine (SYNTHROID) 50 MCG tablet Take 1 tablet (50 mcg total) by mouth daily. 30 tablet 0  . omeprazole (PRILOSEC) 20 MG capsule Take 20 mg by mouth daily.    . potassium chloride (K-DUR) 10 MEQ tablet Take 1 tablet (10 mEq total) by mouth 2 (two) times daily. 60 tablet 0  . pravastatin (PRAVACHOL) 40 MG tablet Take 40 mg by mouth every  evening.     . Vitamin D, Ergocalciferol, (DRISDOL) 50000 units CAPS capsule Take 50,000 Units by mouth 2 (two) times a week. Take on Sunday and Thursday    . torsemide (DEMADEX) 20 MG tablet Take 1 tablet (20 mg total) by mouth 2 (two) times daily. 60 tablet 0   No facility-administered medications prior to visit.      Allergies:   Penicillins and Sulfa antibiotics   Social History   Socioeconomic History  . Marital status: Widowed    Spouse name: None  . Number of children: None  . Years of education: None  . Highest education level: None  Social Needs  . Financial resource strain: None  .  Food insecurity - worry: None  . Food insecurity - inability: None  . Transportation needs - medical: None  . Transportation needs - non-medical: None  Occupational History  . None  Tobacco Use  . Smoking status: Never Smoker  . Smokeless tobacco: Never Used  Substance and Sexual Activity  . Alcohol use: No  . Drug use: No  . Sexual activity: No  Other Topics Concern  . None  Social History Narrative  . None     Family History:  The patient's family history includes Heart disease in her sister.     ROS:   Please see the history of present illness.    ROS All other systems reviewed and are negative.   PHYSICAL EXAM:   VS:  BP 134/70   Pulse 73   Ht 6' (1.829 m)   Wt 201 lb 9.6 oz (91.4 kg)   SpO2 98%   BMI 27.34 kg/m    GEN: Well nourished, well developed, in no acute distress. Appears younger than her stated age.  HEENT: normal  Neck: no JVD, + left carotid bruit. No masses Cardiac: RRR; 4/6 SEM @R  RUSB. no rubs, or gallops,no edema  Respiratory:  clear to auscultation bilaterally, normal work of breathing GI: soft, nontender, nondistended, + BS MS: no deformity or atrophy  Skin: warm and dry, no rash Neuro:  Alert and Oriented x 3, Strength and sensation are intact Psych: euthymic mood, full affect   Wt Readings from Last 3 Encounters:  04/01/17 201 lb 9.6 oz  (91.4 kg)  03/27/17 206 lb (93.4 kg)  09/02/16 213 lb 12.8 oz (97 kg)      Studies/Labs Reviewed:   EKG:  EKG is NOT ordered today.    Recent Labs: 03/23/2017: B Natriuretic Peptide 333.0 03/24/2017: Magnesium 1.9 03/25/2017: TSH 13.881 03/26/2017: BUN 21; Creatinine, Ser 1.37; Potassium 3.7; Sodium 141 03/27/2017: Hemoglobin 12.2; Platelets 139   Lipid Panel No results found for: CHOL, TRIG, HDL, CHOLHDL, VLDL, LDLCALC, LDLDIRECT  Additional studies/ records that were reviewed today include:   ECHO 03/24/17 Study Conclusions - Left ventricle: The cavity size was normal. There was mild   concentric hypertrophy. Systolic function was normal. The   estimated ejection fraction was in the range of 60% to 65%. Wall   motion was normal; there were no regional wall motion   abnormalities. The study is not technically sufficient to allow   evaluation of LV diastolic function. - Aortic valve: There was severe stenosis. Peak velocity (S): 448   cm/s. Mean gradient (S): 49 mm Hg. - Mitral valve: There was mild to moderate regurgitation. - Left atrium: The atrium was moderately dilated. - Right ventricle: Systolic function was normal. - Tricuspid valve: There was moderate regurgitation. - Pulmonary arteries: Systolic pressure was moderate to severely   elevated. PA peak pressure: 60 mm Hg (S). Impressions: - Paced rhythm with underlying atrial fibrillation.  [facility] 03/27/17 Procedures LEFT HEART CATH AND CORONARY ANGIOGRAPHY  Conclusion   Prox LAD lesion is 40% stenosed.  Prox LAD to Mid LAD lesion is 40% stenosed.  Dist RCA lesion is 75% stenosed.  Post Atrio lesion is 40% stenosed.    STS Adult Cardiac Surgery Database Version 2.9 RISK SCORES Procedure: Isolated AVR CALCULATE  Risk of Mortality:  6.555%   Renal Failure:  6.397%   Permanent Stroke:  3.309%   Prolonged Ventilation:  12.259%   DSW Infection:  0.098%   Reoperation:  4.459%   Morbidity or Mortality:  18.601%  Short Length of Stay:  10.460%   Long Length of Stay:  19.540%     ASSESSMENT & PLAN:   84 year old woman with severe, stage D1 symptomatic aortic stenosis.  Symptoms include fatigue, dyspnea on exertion, exertional dizziness and LE edema. She has NHYA class III symptoms and gets dyspnea walking short distances on level ground. L/RHC confirmed severe AS and showed non obst CAD.  She has been having some weakness and feels like she has had too much fluid removed. We have decreased her Torsemide from 20mg  BID to 20mg  daily and an extra for fluid gain.  I have reviewed the natural history of aortic stenosis with the patient and their family members who are present today. We have discussed the limitations of medical therapy and the poor prognosis associated with symptomatic aortic stenosis. We have reviewed potential treatment options, including palliative medical therapy, conventional surgical aortic valve replacement, and transcatheter aortic valve replacement. We discussed treatment options in the context of the patient's specific comorbid medical conditions.   Considering the patient's advanced age and comorbid medical conditions of atrial fibrillation on chronic anticoagulation, recent VT/torsades, PAD, CAD, chronic diastolic CHF, tachy brady s/p PPM, we think TAVR would be much less risky and better tolerated in this elderly woman. We discussed the typical evaluation to include CT angiogram studies, pulmonary function tests, and cardiac surgical evaluation. After the results of his studies are obtained, but will review with the multidisciplinary team to plan procedural aspects of TAVR.  I also reviewed the typical course with TAVR, including specific complications that might arise and expected recovery. All of their questions are answered today.    Medication Adjustments/Labs and Tests Ordered: Current medicines are reviewed at length with the patient today.  Concerns regarding medicines  are outlined above.  Medication changes, Labs and Tests ordered today are listed in the Patient Instructions below. Patient Instructions  Medication Instructions:  Your provider recommends that you continue on your current medications as directed. Please refer to the Current Medication list given to you today.   DECREASE TORSEMIDE FROM 20mg  BID TO 20mg  DAILY.  Labwork: None  Testing/Procedures: Your physician has requested that you have a carotid duplex. This test is an ultrasound of the carotid arteries in your neck. It looks at blood flow through these arteries that supply the brain with blood. Allow one hour for this exam. There are no restrictions or special instructions.   Your physician has recommended that you have a pulmonary function test. Pulmonary Function Tests are a group of tests that measure how well air moves in and out of your lungs.  You will be scheduled for a CARDIAC CT.   Follow-Up: Ander Purpura, the TAVR nurse will call to arrange follow-up appointments.   Any Other Special Instructions Will Be Listed Below (If Applicable).   You are scheduled for TAVR testing on Tuesday, April 07, 2017.    Please arrive at the [facility] main entrance of [facility] in [place] at 9:45 AM for check in.   You are scheduled for a Carotid Ultrasound at 10:00 AM.  You will require an IV prior to having CT scans performed to help protect your kidneys.  Please go to [facility] Radiology Department (First Floor) at 12:30 for check-in.  Your CT scans will start around 1:30PM. After your CT scans are complete you will receive IV medication for 2 additional hours.   Please follow these instructions carefully (unless otherwise directed):   On the Night Before the  Test:  Drink plenty of water.  Do not consume any caffeinated/decaffeinated beverages or chocolate 12 hours prior to your test.  Do not take any antihistamines 12 hours prior to your test.   On the Day of  the Test:  Drink plenty of water. Do not drink any water within one hour of the test.  Do not eat any food 4 hours prior to the test (you may eat until 9:30AM).  You may take your regular medications prior to the test.  DO NOT take Torsemide the day of your tests   After the Test:  Drink plenty of water.  After receiving IV contrast, you may experience a mild flushed feeling. This is normal.  On occasion, you may experience a mild rash up to 24 hours after the test. This is not dangerous. If this occurs, you can take Benadryl 25 mg and increase your fluid intake.  If you experience trouble breathing, this can be serious. If it is severe call 911 IMMEDIATELY. If it is mild, please call our office.  If you have any questions or concerns, please do not hesitate to call.  Structural Heart Team - Lauren, RN [phone]    If you need a refill on your cardiac medications before your next appointment, please call your pharmacy.      Signed, Angelena Form, PA-C  04/01/2017 [facility] [address], [place], [place]  [address] Phone: [phone]; Fax: [phone]    [place]  Patient seen, examined. Available data reviewed. Agree with findings, assessment, and plan as outlined by Nell Range, PA-C.   On my exam the patient is a delightful elderly woman in no distress.  Jugular venous pressure is normal.  Lung fields are clear bilaterally.  Carotid upstrokes are normal with bilateral bruits.  Heart is regular rate and rhythm with a 4/6 harsh late peaking systolic murmur at the right upper sternal border.  There is no diastolic murmur.  A2 is absent.  The abdomen is soft and nontender.  There is no edema.  I have personally reviewed the patient's coronary angiography films and echo images.  She has severe calcification of all 3 aortic valve leaflets and Doppler data consistent with very severe aortic stenosis with a  mean gradient of 49 mmHg.  LV systolic function is well-preserved and there is mild to moderate mitral regurgitation.  The patient's cardiac catheterization demonstrated non-flow-limiting mild diffuse coronary artery disease.  The patient has classic symptoms of progressive aortic stenosis, now limited significantly by exertional dyspnea.  I agree with the findings, assessment, and plan above.  I have further discussed the natural history of aortic stenosis with the patient, treatment options, and expected outcomes with further testing and treatment.  TAVR would be a preferred treatment option in this elderly patient with comorbid conditions detailed above.  She will undergo further multidisciplinary evaluation after CT imaging studies are completed.  Sherren Mocha, M.D. 04/03/2017 6:08 PM

## 2017-04-01 ENCOUNTER — Encounter (INDEPENDENT_AMBULATORY_CARE_PROVIDER_SITE_OTHER): Payer: Self-pay

## 2017-04-01 ENCOUNTER — Encounter: Payer: Self-pay | Admitting: Physician Assistant

## 2017-04-01 ENCOUNTER — Ambulatory Visit (INDEPENDENT_AMBULATORY_CARE_PROVIDER_SITE_OTHER): Payer: Medicare Other | Admitting: Cardiovascular Disease

## 2017-04-01 VITALS — BP 134/70 | HR 73 | Ht 72.0 in | Wt 201.6 lb

## 2017-04-01 DIAGNOSIS — I10 Essential (primary) hypertension: Secondary | ICD-10-CM | POA: Diagnosis not present

## 2017-04-01 DIAGNOSIS — I779 Disorder of arteries and arterioles, unspecified: Secondary | ICD-10-CM | POA: Diagnosis not present

## 2017-04-01 DIAGNOSIS — I2510 Atherosclerotic heart disease of native coronary artery without angina pectoris: Secondary | ICD-10-CM | POA: Diagnosis not present

## 2017-04-01 DIAGNOSIS — Z950 Presence of cardiac pacemaker: Secondary | ICD-10-CM | POA: Diagnosis not present

## 2017-04-01 DIAGNOSIS — I495 Sick sinus syndrome: Secondary | ICD-10-CM | POA: Diagnosis not present

## 2017-04-01 DIAGNOSIS — I739 Peripheral vascular disease, unspecified: Secondary | ICD-10-CM

## 2017-04-01 DIAGNOSIS — I5032 Chronic diastolic (congestive) heart failure: Secondary | ICD-10-CM

## 2017-04-01 DIAGNOSIS — Z8679 Personal history of other diseases of the circulatory system: Secondary | ICD-10-CM

## 2017-04-01 DIAGNOSIS — I4819 Other persistent atrial fibrillation: Secondary | ICD-10-CM

## 2017-04-01 DIAGNOSIS — N189 Chronic kidney disease, unspecified: Secondary | ICD-10-CM | POA: Diagnosis not present

## 2017-04-01 DIAGNOSIS — I481 Persistent atrial fibrillation: Secondary | ICD-10-CM | POA: Diagnosis not present

## 2017-04-01 DIAGNOSIS — I350 Nonrheumatic aortic (valve) stenosis: Secondary | ICD-10-CM | POA: Diagnosis not present

## 2017-04-01 MED ORDER — TORSEMIDE 20 MG PO TABS: 20.0000 mg | ORAL_TABLET | Freq: Every day | ORAL | 11 refills | Status: DC

## 2017-04-01 NOTE — Patient Instructions (Addendum)
Medication Instructions:  Your provider recommends that you continue on your current medications as directed. Please refer to the Current Medication list given to you today.     Labwork: None  Testing/Procedures: Your physician has requested that you have a carotid duplex. This test is an ultrasound of the carotid arteries in your neck. It looks at blood flow through these arteries that supply the brain with blood. Allow one hour for this exam. There are no restrictions or special instructions.   Your physician has recommended that you have a pulmonary function test. Pulmonary Function Tests are a group of tests that measure how well air moves in and out of your lungs.  You will be scheduled for a CARDIAC CT.   Follow-Up: Ander Purpura, the TAVR nurse will call to arrange follow-up appointments.   Any Other Special Instructions Will Be Listed Below (If Applicable).   You are scheduled for TAVR testing on Tuesday, April 07, 2017.    Please arrive at the [facility] main entrance of [facility] in [place] at 9:45 AM for check in.   You are scheduled for a Carotid Ultrasound at 10:00 AM.  You will require an IV prior to having CT scans performed to help protect your kidneys.  Please go to [facility] Radiology Department (First Floor) at 12:30 for check-in.  Your CT scans will start around 1:30PM. After your CT scans are complete you will receive IV medication for 2 additional hours.   Please follow these instructions carefully (unless otherwise directed):   On the Night Before the Test:  Drink plenty of water.  Do not consume any caffeinated/decaffeinated beverages or chocolate 12 hours prior to your test.  Do not take any antihistamines 12 hours prior to your test.   On the Day of the Test:  Drink plenty of water. Do not drink any water within one hour of the test.  Do not eat any food 4 hours prior to the test (you may eat until 9:30AM).  You may take your  regular medications prior to the test.  DO NOT take Torsemide the day of your tests   After the Test:  Drink plenty of water.  After receiving IV contrast, you may experience a mild flushed feeling. This is normal.  On occasion, you may experience a mild rash up to 24 hours after the test. This is not dangerous. If this occurs, you can take Benadryl 25 mg and increase your fluid intake.  If you experience trouble breathing, this can be serious. If it is severe call 911 IMMEDIATELY. If it is mild, please call our office.  If you have any questions or concerns, please do not hesitate to call.  Structural Heart Team - Lauren, RN [phone]    If you need a refill on your cardiac medications before your next appointment, please call your pharmacy.

## 2017-04-05 ENCOUNTER — Other Ambulatory Visit: Payer: Self-pay

## 2017-04-05 DIAGNOSIS — I350 Nonrheumatic aortic (valve) stenosis: Secondary | ICD-10-CM

## 2017-04-05 DIAGNOSIS — N289 Disorder of kidney and ureter, unspecified: Secondary | ICD-10-CM

## 2017-04-07 ENCOUNTER — Ambulatory Visit (HOSPITAL_COMMUNITY)
Admission: RE | Admit: 2017-04-07 | Discharge: 2017-04-07 | Disposition: A | Discharge status: Home / Self Care | Payer: Medicare Other | Source: Ambulatory Visit | Attending: Cardiovascular Disease | Admitting: Cardiovascular Disease

## 2017-04-07 ENCOUNTER — Ambulatory Visit (HOSPITAL_BASED_OUTPATIENT_CLINIC_OR_DEPARTMENT_OTHER)
Admission: RE | Admit: 2017-04-07 | Discharge: 2017-04-07 | Disposition: A | Discharge status: Home / Self Care | Payer: Medicare Other | Source: Ambulatory Visit | Attending: Cardiovascular Disease | Admitting: Cardiovascular Disease

## 2017-04-07 DIAGNOSIS — Z9049 Acquired absence of other specified parts of digestive tract: Secondary | ICD-10-CM | POA: Insufficient documentation

## 2017-04-07 DIAGNOSIS — I350 Nonrheumatic aortic (valve) stenosis: Secondary | ICD-10-CM | POA: Diagnosis present

## 2017-04-07 DIAGNOSIS — R918 Other nonspecific abnormal finding of lung field: Secondary | ICD-10-CM | POA: Insufficient documentation

## 2017-04-07 DIAGNOSIS — R933 Abnormal findings on diagnostic imaging of other parts of digestive tract: Secondary | ICD-10-CM | POA: Diagnosis not present

## 2017-04-07 DIAGNOSIS — I6523 Occlusion and stenosis of bilateral carotid arteries: Secondary | ICD-10-CM | POA: Diagnosis not present

## 2017-04-07 DIAGNOSIS — I712 Thoracic aortic aneurysm, without rupture: Secondary | ICD-10-CM | POA: Diagnosis not present

## 2017-04-07 DIAGNOSIS — I517 Cardiomegaly: Secondary | ICD-10-CM | POA: Insufficient documentation

## 2017-04-07 DIAGNOSIS — Z950 Presence of cardiac pacemaker: Secondary | ICD-10-CM | POA: Insufficient documentation

## 2017-04-07 DIAGNOSIS — N289 Disorder of kidney and ureter, unspecified: Secondary | ICD-10-CM | POA: Insufficient documentation

## 2017-04-07 MED ORDER — SODIUM BICARBONATE BOLUS VIA INFUSION
INTRAVENOUS | Status: AC
  Administered 2017-04-07: 75 meq via INTRAVENOUS
  Filled 2017-04-07: qty 1

## 2017-04-07 MED ORDER — IOPAMIDOL (ISOVUE-370) INJECTION 76%
INTRAVENOUS | Status: AC
  Administered 2017-04-07: 90 mL
  Filled 2017-04-07: qty 100

## 2017-04-07 MED ORDER — DEXTROSE 5 % IV SOLN
INTRAVENOUS | Status: AC
  Administered 2017-04-07: 12:00:00 via INTRAVENOUS
  Filled 2017-04-07: qty 500

## 2017-04-07 NOTE — Progress Notes (Signed)
*  PRELIMINARY RESULTS* Vascular Ultrasound Carotid Duplex (Doppler) has been completed.  Preliminary findings:   Right Carotid: There is evidence in the right ICA of a 1-39% stenosis. Prior CEA.    Left Carotid: There is evidence in the left ICA of a 1-39% stenosis. The ECA appears >50% stenosed. Significant amount of shadowing plaque at bifrucation, can not rule out higher grade stenosis.    Vertebrals: Both vertebral arteries were patent with antegrade flow.    [name] 04/07/2017, 10:59 AM

## 2017-04-07 NOTE — Progress Notes (Signed)
Client received from radiology post procedure for bicarb infusion and bicarb 49mEq in 500cc D5W at 90cc/hr for 2 hours

## 2017-04-09 ENCOUNTER — Encounter: Payer: Medicare Other | Admitting: Cardiology

## 2017-04-17 ENCOUNTER — Ambulatory Visit (HOSPITAL_COMMUNITY)
Admission: RE | Admit: 2017-04-17 | Discharge: 2017-04-17 | Disposition: A | Discharge status: Home / Self Care | Payer: Medicare Other | Source: Ambulatory Visit | Attending: Cardiovascular Disease | Admitting: Cardiovascular Disease

## 2017-04-17 ENCOUNTER — Institutional Professional Consult (permissible substitution) (INDEPENDENT_AMBULATORY_CARE_PROVIDER_SITE_OTHER): Payer: Medicare Other | Admitting: Thoracic Surgery (Cardiothoracic Vascular Surgery)

## 2017-04-17 ENCOUNTER — Other Ambulatory Visit: Payer: Self-pay

## 2017-04-17 ENCOUNTER — Encounter: Payer: Self-pay | Admitting: Physical Therapy

## 2017-04-17 ENCOUNTER — Encounter: Payer: Self-pay | Admitting: Thoracic Surgery (Cardiothoracic Vascular Surgery)

## 2017-04-17 ENCOUNTER — Ambulatory Visit: Payer: Medicare Other | Attending: Cardiovascular Disease | Admitting: Physical Therapy

## 2017-04-17 DIAGNOSIS — I350 Nonrheumatic aortic (valve) stenosis: Secondary | ICD-10-CM

## 2017-04-17 DIAGNOSIS — I209 Angina pectoris, unspecified: Secondary | ICD-10-CM

## 2017-04-17 DIAGNOSIS — I25119 Atherosclerotic heart disease of native coronary artery with unspecified angina pectoris: Secondary | ICD-10-CM | POA: Diagnosis not present

## 2017-04-17 DIAGNOSIS — R942 Abnormal results of pulmonary function studies: Secondary | ICD-10-CM | POA: Diagnosis not present

## 2017-04-17 DIAGNOSIS — R2689 Other abnormalities of gait and mobility: Secondary | ICD-10-CM | POA: Diagnosis not present

## 2017-04-17 DIAGNOSIS — I2510 Atherosclerotic heart disease of native coronary artery without angina pectoris: Secondary | ICD-10-CM | POA: Insufficient documentation

## 2017-04-17 LAB — PULMONARY FUNCTION TEST
DL/VA % PRED: 59 %
DL/VA: 3.32 ml/min/mmHg/L
DLCO UNC % PRED: 40 %
DLCO unc: 14.1 ml/min/mmHg
FEF 25-75 POST: 3.09 L/s
FEF 25-75 PRE: 2.53 L/s
FEF2575-%CHANGE-POST: 22 %
FEF2575-%PRED-PRE: 173 %
FEF2575-%Pred-Post: 211 %
FEV1-%Change-Post: 4 %
FEV1-%PRED-POST: 84 %
FEV1-%Pred-Pre: 81 %
FEV1-Post: 2.08 L
FEV1-Pre: 1.99 L
FEV1FVC-%CHANGE-POST: 4 %
FEV1FVC-%Pred-Pre: 117 %
FEV6-%CHANGE-POST: 0 %
FEV6-%PRED-POST: 76 %
FEV6-%Pred-Pre: 76 %
FEV6-PRE: 2.36 L
FEV6-Post: 2.36 L
FEV6FVC-%PRED-PRE: 106 %
FEV6FVC-%Pred-Post: 106 %
FVC-%CHANGE-POST: 0 %
FVC-%Pred-Post: 72 %
FVC-%Pred-Pre: 72 %
FVC-Post: 2.36 L
FVC-Pre: 2.36 L
POST FEV1/FVC RATIO: 88 %
PRE FEV1/FVC RATIO: 84 %
Post FEV6/FVC ratio: 100 %
Pre FEV6/FVC Ratio: 100 %
RV % pred: 139 %
RV: 4.13 L
TLC % pred: 103 %
TLC: 6.46 L

## 2017-04-17 MED ORDER — ALBUTEROL SULFATE (2.5 MG/3ML) 0.083% IN NEBU
2.5000 mg | INHALATION_SOLUTION | Freq: Once | RESPIRATORY_TRACT | Status: AC
  Administered 2017-04-17: 2.5 mg via RESPIRATORY_TRACT

## 2017-04-17 NOTE — Patient Instructions (Signed)
Stop taking Eliquis on Thursday February 14 and begin taking Lovenox injections the following day  Continue taking all other medications without change through the day before surgery.  Have nothing to eat or drink after midnight the night before surgery.  On the morning of surgery take only Synthroid and Neurontin with a sip of water.

## 2017-04-17 NOTE — Progress Notes (Signed)
[facility]  CARDIOTHORACIC SURGERY CONSULTATION REPORT  Referring Provider is Gollan, Kathlene November, MD PCP is Vasireddy, Lanetta Inch, MD  Chief Complaint  Patient presents with  . 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  The patient has been told that she had a heart murmur for many years.  The patient's cardiac history otherwise dates back to 2014 when she underwent Whipple pancreaticoduodenectomy.  Her hospitalization was prolonged and immediately following hospital discharge she suffered a "light" heart attack.  At the time she was treated at [facility].  She was followed for a period of time by Dr. Clayborn Bigness in the [facility].  In 2017 she developed dizzy spells and a Holter monitor revealed findings consistent with symptomatic bradycardia with tachybradycardia syndrome.  She underwent permanent pacemaker placement in December 2017 with subsequent revision of the pacemaker secondary to lead dislodgment.  She did well until recently when she was hospitalized at [facility] with generalized weakness, diaphoresis, chest discomfort, and shortness of breath.  Telemetry revealed sustained ventricular tachycardia with torsades, frequent PVCs, and runs of nonsustained VT.   She was noted to have severely depleted potassium level.  Her pacemaker was reprogrammed and potassium replaced.  Diagnostic cardiac catheterization was performed revealed multivessel coronary artery disease with 40% stenosis of the left anterior descending coronary artery, high-grade stenosis of a diagonal branch, and 75% stenosis of the distal right coronary artery.  The aortic valve could not be crossed.  Right heart catheterization was not performed.  Echocardiogram revealed normal left ventricular systolic function with severe aortic stenosis.  Peak velocity across the aortic valve range between 4.1 and 5 m/s corresponding to mean transvalvular gradient estimated 49 mmHg.  She had mild to moderate mitral regurgitation, severe left atrial enlargement, and moderate tricuspid regurgitation with moderate pulmonary hypertension.  Her coronary artery disease was treated medically and she was referred to Dr. Burt Knack who saw her in consultation on April 01, 2017.  The patient has subsequently undergone CT angiography and been referred for surgical consultation.  The patient has been widowed for approximately 11 years and lives alone in [place] in [place].  She has 3 adult children including a daughter who is very supportive and accompanies her for her office consultation today.  The patient has remained reasonably active physically and entirely functionally independent until recently.  She still drives an automobile, tends to chores around the house and outdoors in her yard when the weather permits.  She describes a gradual decline with decreased energy and exertional shortness of breath.  She does not get short of breath with light chores but only with more sustained activity.  She denies any PND, orthopnea, or resting shortness of breath.  She has had some exertional tightness across her chest which is always relieved by rest.  She has had some lower extremity swelling which has improved.  She had dizzy  spells at the time of her recent  hospitalization and associated with her irregular heart rhythms.  She has not had dizzy spells since hospital discharge.  She remains fully ambulatory although her balance is somewhat limited and she uses a cane for stability.  She has problems with arthritis in her right knee and her lower back.  Past Medical History:  Diagnosis Date  . Anxiety   . Bradycardia   . Carotid artery occlusion   . Chronic diastolic CHF (congestive heart failure) ([place])   . Coronary artery disease involving native coronary artery of native heart   . GERD (gastroesophageal reflux disease)   . HLD (hyperlipidemia)   . Hypertension   . Obesity   . Osteoarthritis   . Permanent atrial fibrillation ([place])   . Presence of permanent cardiac pacemaker   . PVD (peripheral vascular disease) ([place])   . Severe aortic stenosis   . Stomach cancer ([place]) ~ 2013  . Vertigo     Past Surgical History:  Procedure Laterality Date  . CARDIOVASCULAR STRESS TEST    . CARDIOVERSION N/A 08/08/2016   Procedure: CARDIOVERSION;  Surgeon: Sueanne Margarita, MD;  Location: [facility] ENDOSCOPY;  Service: Cardiovascular;  Laterality: N/A;  . CAROTID ENDARTERECTOMY Right ~ 2005  . CATARACT EXTRACTION W/ INTRAOCULAR LENS IMPLANT Right   . CHOLECYSTECTOMY  ~ 2013   "part of her Whipple OR"  . EP IMPLANTABLE DEVICE N/A 03/04/2016   Procedure: Pacemaker Implant;  Surgeon: Will Meredith Leeds, MD;  Location: [place] [facility];  Service: Cardiovascular;  Laterality: N/A;  . EP IMPLANTABLE DEVICE N/A 03/05/2016   Procedure: Lead Revision/Repair;  Surgeon: Deboraha Sprang, MD;  Location: [place] [facility];  Service: Cardiovascular;  Laterality: N/A;  . INSERT / REPLACE / REMOVE PACEMAKER    . LEFT HEART CATH AND CORONARY ANGIOGRAPHY N/A 03/27/2017   Procedure: LEFT HEART CATH AND CORONARY ANGIOGRAPHY;  Surgeon: Minna Merritts, MD;  Location: [facility];  Service: Cardiovascular;  Laterality: N/A;  . US  ECHOCARDIOGRAPHY    . VAGINAL HYSTERECTOMY    . WHIPPLE PROCEDURE  ~ 2013    Family History  Problem Relation Age of Onset  . Heart disease Sister     Social History   Socioeconomic History  . Marital status: Widowed    Spouse name: Not on file  . Number of children: Not on file  . Years of education: Not on file  . Highest education level: Not on file  Social Needs  . Financial resource strain: Not on file  . Food insecurity - worry: Not on file  . Food insecurity - inability: Not on file  . Transportation needs - medical: Not on file  . Transportation needs - non-medical: Not on file  Occupational History  . Not on file  Tobacco Use  . Smoking status: Never Smoker  . Smokeless tobacco: Never Used  Substance and Sexual Activity  . Alcohol use: No  . Drug use: No  . Sexual activity: No  Other Topics Concern  . Not on file  Social History Narrative  . Not on file    Current Outpatient Medications  Medication Sig Dispense Refill  . acetaminophen (TYLENOL) 500 MG tablet Take 1,000 mg by mouth every 6 (six) hours as needed for moderate pain.    Marland Kitchen apixaban (ELIQUIS) 5 MG TABS tablet Take 5 mg by mouth 2 (two) times daily.    Marland Kitchen diltiazem (CARDIZEM CD) 180 MG 24 hr capsule Take 1 capsule (180 mg total) by mouth daily.  90 capsule 3  . escitalopram (LEXAPRO) 10 MG tablet Take 10 mg by mouth at bedtime.     . ferrous sulfate 325 (65 FE) MG tablet Take 325 mg by mouth daily with breakfast.    . gabapentin (NEURONTIN) 600 MG tablet Take 600 mg by mouth 2 (two) times daily.     Marland Kitchen levothyroxine (SYNTHROID) 50 MCG tablet Take 1 tablet (50 mcg total) by mouth daily. 30 tablet 0  . omeprazole (PRILOSEC) 20 MG capsule Take 20 mg by mouth daily.    . potassium chloride (K-DUR) 10 MEQ tablet Take 1 tablet (10 mEq total) by mouth 2 (two) times daily. 60 tablet 0  . pravastatin (PRAVACHOL) 40 MG tablet Take 40 mg by mouth every evening.     . torsemide (DEMADEX) 20 MG tablet Take 1  tablet (20 mg total) by mouth daily. Take one additional tablet daily as needed for swelling. 45 tablet 11  . Vitamin D, Ergocalciferol, (DRISDOL) 50000 units CAPS capsule Take 50,000 Units by mouth 2 (two) times a week. Take on Sunday and Thursday     No current facility-administered medications for this visit.     Allergies  Allergen Reactions  . Penicillins Hives and Other (See Comments)    Has patient had a PCN reaction causing immediate rash, facial/tongue/throat swelling, SOB or lightheadedness with hypotension: Yes Has patient had a PCN reaction causing severe rash involving mucus membranes or skin necrosis: No Has patient had a PCN reaction that required hospitalization: No Has patient had a PCN reaction occurring within the last 10 years: No If all of the above answers are "NO", then may proceed with Cephalosporin use.   . Sulfa Antibiotics Hives      Review of Systems:   General:  normal appetite, decreased energy, no weight gain, no weight loss, no fever  Cardiac:  + chest pain with exertion, no chest pain at rest, + SOB with exertion, no resting SOB, no PND, no orthopnea, no palpitations, + arrhythmia, + atrial fibrillation, + LE edema, + dizzy spells, no syncope  Respiratory:  + shortness of breath, NO home oxygen, NO productive cough, NO dry cough, NO bronchitis, NO wheezing, NO hemoptysis, NO asthma, NO pain with inspiration or cough, NO sleep apnea, NO CPAP at night  GI:   no difficulty swallowing, no reflux, no frequent heartburn, no hiatal hernia, no abdominal pain, no constipation, no diarrhea, no hematochezia, no hematemesis, no melena  GU:   no dysuria,  + frequency, no urinary tract infection, no hematuria, no kidney stones, + kidney disease  Vascular:  no pain suggestive of claudication, + pain in feet, no leg cramps, + varicose veins, no DVT, no non-healing foot ulcer  Neuro:   no stroke, no TIA's, no seizures, no headaches, no temporary blindness one eye,  no  slurred speech, + peripheral neuropathy, + chronic pain, + mild instability of gait, no memory/cognitive dysfunction  Musculoskeletal: + arthritis, no joint swelling, no myalgias, + some difficulty walking, mildly limited mobility   Skin:   no rash, no itching, no skin infections, no pressure sores or ulcerations  Psych:   no anxiety, no depression, no nervousness, no unusual recent stress  Eyes:   no blurry vision, no floaters, no recent vision changes, no wears glasses or contacts  ENT:   no hearing loss, no loose or painful teeth, + dentures, last saw dentist November 2017  Hematologic:  + easy bruising, no abnormal bleeding, no clotting disorder, no frequent epistaxis  Endocrine:  no diabetes, does not check CBG's at home           Physical Exam:   BP (!) 145/70 (BP Location: Left Arm, Patient Position: Sitting, Cuff Size: Large)   Pulse 72   Resp 18   Ht 6' (1.829 m)   Wt 211 lb 6.4 oz (95.9 kg)   SpO2 96%   BMI 28.67 kg/m   General:  Elderly, mildly obese, otherwise  well-appearing  HEENT:  Unremarkable   Neck:   no JVD, no bruits, no adenopathy   Chest:   clear to auscultation, symmetrical breath sounds, no wheezes, no rhonchi   CV:   Irregular rate and rhythm, grade III/VI crescendo/decrescendo murmur heard best at RSB,  no diastolic murmur  Abdomen:  soft, non-tender, no masses   Extremities:  warm, well-perfused, pulses palpable, no LE edema  Rectal/GU  Deferred  Neuro:   Grossly non-focal and symmetrical throughout  Skin:   Clean and dry, no rashes, no breakdown   Diagnostic Tests:  Transthoracic Echocardiography  Patient:    Envi, Eagleson MR #:       [id number] Study Date: 03/24/2017 Gender:     F Age:        11 Height:     182.9 cm Weight:     93.1 kg BSA:        2.19 m^2 Pt. Status: Room:       238A   ADMITTING    Rhett Bannister  REFERRING    Ethlyn Daniels  SONOGRAPHER  Cindy Hazy, RDCS  ATTENDING    Manuella Ghazi,  Vipul S  PERFORMING   [facility]  cc:  ------------------------------------------------------------------- LV EF: 60% -   65%  ------------------------------------------------------------------- Indications:      R94.31 Abnormal EKG.  ------------------------------------------------------------------- History:   PMH:  Acquired from the patient and from the patient&'s chart.  PMH:  Atrial Fibrillation. Anxiety. Bilateral lower extremity edema. Bradycardia. Murmur. Congestive heart failure. Peripheral vascular disease. Shortness of breath.  Risk factors: Hypertension. Dyslipidemia.  ------------------------------------------------------------------- Study Conclusions  - Left ventricle: The cavity size was normal. There was mild   concentric hypertrophy. Systolic function was normal. The   estimated ejection fraction was in the range of 60% to 65%. Wall   motion was normal; there were no regional wall motion   abnormalities. The study is not technically sufficient to allow   evaluation of LV diastolic function. - Aortic valve: There was severe stenosis. Peak velocity (S): 448   cm/s. Mean gradient (S): 49 mm Hg. - Mitral valve: There was mild to moderate regurgitation. - Left atrium: The atrium was moderately dilated. - Right ventricle: Systolic function was normal. - Tricuspid valve: There was moderate regurgitation. - Pulmonary arteries: Systolic pressure was moderate to severely   elevated. PA peak pressure: 60 mm Hg (S).  Impressions:  - Paced rhythm with underlying atrial fibrillation.  ------------------------------------------------------------------- Study data:   Study status:  Routine.  Procedure:  The patient reported no pain pre or post test. Transthoracic echocardiography for left ventricular function evaluation, for right ventricular function evaluation, and for assessment of valvular function. Image quality was adequate.  Study completion:  There were  no complications.          Transthoracic echocardiography.  M-mode, complete 2D, spectral Doppler, and color Doppler.  Birthdate: Patient birthdate: Feb 20, 1927.  Age:  Patient is 84 yr old.  Sex: Gender: female.    BMI:  27.8 kg/m^2.  Blood pressure:     158/59 Patient status:  Inpatient.  Study date:  Study date: 03/24/2017. Study time: 11:46 AM.  Location:  Echo laboratory.  -------------------------------------------------------------------  ------------------------------------------------------------------- Left ventricle:  The cavity size was normal. There was mild concentric hypertrophy. Systolic function was normal. The estimated ejection fraction was in the range of 60% to 65%. Wall motion was normal; there were no regional wall motion abnormalities. The study is not technically sufficient to allow evaluation of LV diastolic function.  ------------------------------------------------------------------- Aortic valve:   Trileaflet; normal thickness, severely calcified leaflets. Mobility was not restricted.  Doppler:   There was severe stenosis.   There was no regurgitation.    VTI ratio of LVOT to aortic valve: 0.3. Valve area (VTI): 1.03 cm^2. Indexed valve area (VTI): 0.47 cm^2/m^2. Peak velocity ratio of LVOT to aortic valve: 0.26. Indexed valve area (Vmax): 0.42 cm^2/m^2. Mean velocity ratio of LVOT to aortic valve: 0.29. Valve area (Vmean): 0.99 cm^2. Indexed valve area (Vmean): 0.45 cm^2/m^2.    Mean gradient (S): 49 mm Hg. Peak gradient (S): 80 mm Hg.  ------------------------------------------------------------------- Aorta:  Aortic root: The aortic root was normal in size.  ------------------------------------------------------------------- Mitral valve:   Structurally normal valve.   Mobility was not restricted.  Doppler:  Transvalvular velocity was within the normal range. There was no evidence for stenosis. There was mild to moderate regurgitation.    Peak  gradient (D): 13 mm Hg.  ------------------------------------------------------------------- Left atrium:  The atrium was moderately dilated.  ------------------------------------------------------------------- Right ventricle:  The cavity size was normal. Wall thickness was normal. Systolic function was normal.  ------------------------------------------------------------------- Pulmonic valve:    Structurally normal valve.   Cusp separation was normal.  Doppler:  Transvalvular velocity was within the normal range. There was no evidence for stenosis. There was no regurgitation.  ------------------------------------------------------------------- Tricuspid valve:   Structurally normal valve.    Doppler: Transvalvular velocity was within the normal range. There was moderate regurgitation.  ------------------------------------------------------------------- Pulmonary artery:   The main pulmonary artery was normal-sized. Systolic pressure was moderate to severely elevated.  ------------------------------------------------------------------- Right atrium:  The atrium was normal in size.  ------------------------------------------------------------------- Pericardium:  There was no pericardial effusion.  ------------------------------------------------------------------- Systemic veins: Inferior vena cava: The vessel was normal in size.  ------------------------------------------------------------------- Measurements   Left ventricle                            Value          Reference  LV ID, ED, PLAX chordal                   46.5  mm       43 - 52  LV ID, ES, PLAX chordal                   27.7  mm       23 - 38  LV fx shortening, PLAX chordal            40    %        >=29  LV PW thickness, ED                       14.4  mm       ---------  IVS/LV PW ratio, ED  1.02           <=1.3  Stroke volume, 2D                         115   ml        ---------  Stroke volume/bsa, 2D                     52    ml/m^2   ---------  LV e&', lateral                            7.62  cm/s     ---------  LV E/e&', lateral                          23.62          ---------  LV e&', medial                             8.27  cm/s     ---------  LV E/e&', medial                           21.77          ---------  LV e&', average                            7.95  cm/s     ---------  LV E/e&', average                          22.66          ---------    Ventricular septum                        Value          Reference  IVS thickness, ED                         14.7  mm       ---------    LVOT                                      Value          Reference  LVOT ID, S                                21    mm       ---------  LVOT area                                 3.46  cm^2     ---------  LVOT ID                                   21    mm       ---------  LVOT peak velocity, S  116   cm/s     ---------  LVOT mean velocity, S                     93    cm/s     ---------  LVOT VTI, S                               33.3  cm       ---------  LVOT peak gradient, S                     5     mm Hg    ---------  Stroke volume (SV), LVOT DP               115.3 ml       ---------  Stroke index (SV/bsa), LVOT DP            52.6  ml/m^2   ---------    Aortic valve                              Value          Reference  Aortic valve peak velocity, S             448   cm/s     ---------  Aortic valve mean velocity, S             325   cm/s     ---------  Aortic valve VTI, S                       112   cm       ---------  Aortic mean gradient, S                   49    mm Hg    ---------  Aortic peak gradient, S                   80    mm Hg    ---------  VTI ratio, LVOT/AV                        0.3            ---------  Aortic valve area, VTI                    1.03  cm^2     ---------  Aortic valve area/bsa, VTI                0.47  cm^2/m^2  ---------  Velocity ratio, peak, LVOT/AV             0.26           ---------  Aortic valve area/bsa, peak               0.42  cm^2/m^2 ---------  velocity  Velocity ratio, mean, LVOT/AV             0.29           ---------  Aortic valve area, mean velocity          0.99  cm^2     ---------  Aortic valve area/bsa, mean  0.45  cm^2/m^2 ---------  velocity  Aortic regurg pressure half-time          489   ms       ---------    Aorta                                     Value          Reference  Aortic root ID, ED                        34    mm       ---------    Left atrium                               Value          Reference  LA ID, A-P, ES                            65    mm       ---------  LA ID/bsa, A-P                    (H)     2.97  cm/m^2   <=2.2  LA volume, S                              154   ml       ---------  LA volume/bsa, S                          70.3  ml/m^2   ---------  LA volume, ES, 1-p A4C                    137   ml       ---------  LA volume/bsa, ES, 1-p A4C                62.5  ml/m^2   ---------  LA volume, ES, 1-p A2C                    157   ml       ---------  LA volume/bsa, ES, 1-p A2C                71.7  ml/m^2   ---------    Mitral valve                              Value          Reference  Mitral E-wave peak velocity               180   cm/s     ---------  Mitral deceleration time                  176   ms       150 - 230  Mitral peak gradient, D                   13    mm Hg    ---------    Pulmonary arteries  Value          Reference  PA pressure, S, DP                (H)     60    mm Hg    <=30    Right ventricle                           Value          Reference  TAPSE                                     17.3  mm       ---------  Legend: (L)  and  (H)  mark values outside specified reference range.  ------------------------------------------------------------------- Prepared and Electronically  Authenticated by  Esmond Plants, MD, [facility] 2019-01-01T13:35:39   LEFT HEART CATH AND CORONARY ANGIOGRAPHY  Conclusion    Prox LAD lesion is 40% stenosed.  Prox LAD to Mid LAD lesion is 40% stenosed.  Dist RCA lesion is 75% stenosed.  Post Atrio lesion is 40% stenosed.    Procedural Details/Technique   Technical Details Cardiac Catheterization Procedure Note  Name: Marionna Gonia MRN: [id number] DOB: [birth year]  Procedure: Left Heart Cath, Selective Coronary Angiography, LV angiography  Indication:  84 y.o. female with history of permanent Afib on Eliquis, tachybrady syndrome s/p MDT PPM, chronic diastolic CHF, severe aortic valve stenosis, HTN, HLD, and PVD who presented to [facility] with weakness/flushing. VT/torsades seen on monitor, potassium of 3.0. Cradiac cath for VT, torsades, severe aortic valve stenosis, preop/candidate for TAVR   Procedural details: The right groin was prepped, draped, and anesthetized with 1% lidocaine. Using modified Seldinger technique, a 5 French sheath was introduced into the right femoral artery. Standard Judkins catheters (JL 5, JR 5 and pigtail catheter) were used for coronary angiography and left ventriculography. Catheter exchanges were performed over a guidewire. There were no immediate procedural complications. The patient was transferred to the post catheterization recovery area for further monitoring.  Moderate sedation: I was Face to Face with the patient during this time: (code: [phone])   Procedural Findings:  Coronary dominance: Right  Left mainstem: Large vessel that bifurcates into the LAD and left circumflex, no significant disease noted  Left anterior descending (LAD): Large vessel that extends to the apical region, diagonal branch 1 of moderate size, proximal to mid sequential lesions estimated at 40 to 50%.  Left circumflex (LCx): Large vessel with OM branch 2, no significant disease noted. Mild lumenal irregularities,  calcified  Right coronary artery (RCA): Right dominant vessel with PL and PDA, moderate to severe distal RCA disease prior to bifurcation. Mild calcified proximal disease. PDA disease, moderate/focal  Left ventriculography: unable to cross aortic valve for pressures  Final Conclusions:  Moderate to severe distal RCA disease, Mild to Moderate mid LAD disease Medical management at this time  Recommendations:  Asa and statin, b-blocker We will refer for evaluation of TAVR given severe aortic valve stenosis on echo  Timothy Gollan 03/27/2017, 9:20 AM   Estimated blood loss <50 mL.  During this procedure the patient was administered the following to achieve and maintain moderate conscious sedation: Versed 1 mg, Fentanyl 25 mcg, while the patient's heart rate, blood pressure, and oxygen saturation were continuously monitored. The period of conscious sedation was 30 minutes, of which I was present face-to-face 100% of  this time.  Coronary Findings   Diagnostic  Dominance: Right  Left Anterior Descending  Prox LAD lesion 40% stenosed  Prox LAD lesion is 40% stenosed. The lesion is calcified.  Prox LAD to Mid LAD lesion 40% stenosed  Prox LAD to Mid LAD lesion is 40% stenosed.  Right Coronary Artery  Dist RCA lesion 75% stenosed  Dist RCA lesion is 75% stenosed.  Right Posterior Atrioventricular Branch  Post Atrio lesion 40% stenosed  Post Atrio lesion is 40% stenosed.  Intervention   No interventions have been documented.  Coronary Diagrams   Diagnostic Diagram       Implants     Vascular Products  Device Closure Mynxgrip 6f [id number] - Implanted    Inventory item: Device Closure Mynxgrip 75f Model/Cat number: [id number]  Manufacturer: [employer] Lot number: [id number]  Device identifier: [id number] Device identifier type: GS1  Area Of Implantation: Groin    GUDID Information   Request status Successful    Brand name: [facility] Version/Model: [id number]  Company  name: [employer]. MRI safety info as of 03/27/17: Labeling does not contain MRI Safety Information  Contains dry or latex rubber: No    GMDN P.T. name: Wound hydrogel dressing, sterile    As of 03/27/2017   Status: Implanted      MERGE Images   Show images for CARDIAC CATHETERIZATION   Link to Procedure Log   Procedure Log    Hemo Data 03/27/17 0830--03/27/17 0923 before discharge   AO Systolic Cath Pressure AO Diastolic Cath Pressure AO Mean Cath Pressure  142 59 mmHg 92 mmHg  146 52 mmHg 88 mmHg  142 57 mmHg 88 mmHg     Cardiac TAVR CT  TECHNIQUE: The patient was scanned on a [employer]. A 120 kV retrospective scan was triggered in the descending thoracic aorta at 111 HU's. Gantry rotation speed was 250 msecs and collimation was .6 mm. No beta blockade or nitro were given. The 3D data set was reconstructed in 5% intervals of the R-R cycle. Systolic and diastolic phases were analyzed on a dedicated work station using MPR, MIP and VRT modes. The patient received 80 cc of contrast.  FINDINGS: Aortic Valve: Trileaflet aortic valve with severely thickened and calcified valves and minimal calcifications extending into the LVOT.  Aorta: Mildly aneurysmal ascending aorta, mild diffuse calcifications, no dissection.  Sinotubular Junction: 28 x 28 mm  Ascending Thoracic Aorta: Maximum diameter 40 x 39 mm  Aortic Arch: 28 x 25 mm  Descending Thoracic Aorta: 24 x 23 mm  Sinus of Valsalva Measurements:  Non-coronary: 31 mm  Right -coronary: 30 mm  Left -coronary: 31 mm  Sinus of Valsalva Height:  Right -coronary: 21 mm  Left -coronary: 22 mm  Coronary Artery Height above Annulus:  Left Main: 16 mm  Right Coronary: 17 mm  Virtual Basal Annulus Measurements:  Maximum/Minimum Diameter: 24.6 x 22.3 mm  Mean Diameter: 23.2 mm  Perimeter: 74 mm  Area: 422 mm2  Optimum Fluoroscopic Angle for Delivery: LAO 8 CAU  6  IMPRESSION: 1. Trileaflet aortic valve with severely thickened and calcified valves and minimal calcifications extending into the LVOT. Annular measurements suitable for a 26 mm Edwards-SAPIEN 3 valve or a 29 mm Evolut R Medtronic valve.  2. Sufficient coronary to annulus distance.  3. Optimum Fluoroscopic Angle for Delivery: LAO 8 CAU 6.  4. Left atrial appendage is large, multilobular with a filling defect at the tip of the largest lobe measuring 30  x 17 mm. This most probably represents a thrombus (the defect is seen on early and delayed contrast images).  5. Mildly dilated pulmonary artery measuring 33 mm suggestive of pulmonary hypertension.   Electronically Signed   By: Ena Dawley   On: 04/07/2017 20:08    CT ANGIOGRAPHY CHEST, ABDOMEN AND PELVIS  TECHNIQUE: Multidetector CT imaging through the chest, abdomen and pelvis was performed using the standard protocol during bolus administration of intravenous contrast. Multiplanar reconstructed images and MIPs were obtained and reviewed to evaluate the vascular anatomy.  CONTRAST:  60 mL of Isovue 370.  COMPARISON:  None.  FINDINGS: CTA CHEST FINDINGS  Cardiovascular: Heart size is enlarged with biatrial dilatation. 3 x 1.4 cm filling defect in the tip of the left atrial appendage (axial image 52 of series 15) concerning for potential left atrial appendage thrombus. There is no significant pericardial fluid, thickening or pericardial calcification. There is aortic atherosclerosis, as well as atherosclerosis of the great vessels of the mediastinum and the coronary arteries, including calcified atherosclerotic plaque in the left main, left anterior descending, left circumflex and right coronary arteries. Severe thickening calcification of the aortic valve. Mild calcification of the mitral annulus. Left sided pacemaker with lead tips terminating in the right atrial appendage and along the free  wall of the right ventricle near the right ventricular apex.  Mediastinum/Lymph Nodes: No pathologically enlarged mediastinal or hilar lymph nodes. Esophagus is unremarkable in appearance. No axillary lymphadenopathy.  Lungs/Pleura: Diffuse areas of ground-glass attenuation and interlobular septal thickening, suggesting a background of mild interstitial pulmonary edema. No confluent consolidative airspace disease. No pleural effusions. No definite suspicious appearing pulmonary nodules or masses.  Musculoskeletal/Soft Tissues: There are no aggressive appearing lytic or blastic lesions noted in the visualized portions of the skeleton.  CTA ABDOMEN AND PELVIS FINDINGS  Hepatobiliary: Liver has a slightly shrunken appearance and nodular contour, indicative of early changes of cirrhosis. No discrete cystic or solid hepatic lesions. No intra or extrahepatic biliary ductal dilatation. Status post cholecystectomy.  Pancreas: Status post Whipple procedure. Remaining body and tail of the pancreas are normal in appearance. No pancreatic or peripancreatic fluid or inflammatory changes.  Spleen: Unremarkable.  Adrenals/Urinary Tract: Bilateral adrenal glands are normal in appearance. Exophytic 2.5 cm low-attenuation lesion extending off the lower pole of the right kidney is compatible with a small simple cyst. Multifocal cortical thinning in the left kidney, compatible with multifocal post infarct or post infectious scarring. No hydroureteronephrosis. Urinary bladder is normal in appearance.  Stomach/Bowel: Postoperative changes of Whipple procedure. No pathologic dilatation of small bowel or colon. Normal appendix.  Vascular/Lymphatic: Aortic atherosclerosis, without evidence of aneurysm or dissection in the abdominal or pelvic vasculature. No lymphadenopathy noted in the abdomen or pelvis.  Reproductive: Status post hysterectomy. Ovaries are not confidently identified  may be surgically absent or atrophic.  Other: No significant volume of ascites.  No pneumoperitoneum.  Musculoskeletal: There are no aggressive appearing lytic or blastic lesions noted in the visualized portions of the skeleton.  VASCULAR MEASUREMENTS PERTINENT TO TAVR:  AORTA:  Minimal Aortic Diameter-16 x 15 mm  Severity of Aortic Calcification-severe  RIGHT PELVIS:  Right Common Iliac Artery -  Minimal Diameter-8.0 x 10.2 mm  Tortuosity-mild  Calcification-moderate  Right External Iliac Artery -  Minimal Diameter-8.5 x 7.3 mm  Tortuosity-mild-to-moderate  Calcification-none  Right Common Femoral Artery -  Minimal Diameter-7.9 x 7.8 mm  Tortuosity-mild  Calcification-mild  LEFT PELVIS:  Left Common Iliac Artery -  Minimal Diameter-8.6 x  9.2 mm  Tortuosity-mild  Calcification-moderate  Left External Iliac Artery -  Minimal Diameter-8.3 x 7.7 mm  Tortuosity-mild  Calcification-minimal  Left Common Femoral Artery -  Minimal Diameter-8.2 x 8.0 mm  Tortuosity-mild  Calcification-mild  Review of the MIP images confirms the above findings.  IMPRESSION: 1. Vascular findings and measurements pertinent to potential TAVR, as above. 2. Severe thickening calcification of the aortic valve, compatible with the reported clinical history of severe aortic stenosis. 3. Cardiomegaly with biatrial dilatation. Notably, there is a 3 x 1.4 cm filling defect in the tip of the left atrial appendage concerning for potential left atrial appendage thrombus. Correlation with transesophageal echocardiography is suggested if not recently performed. 4. The appearance of the lungs suggests mild interstitial pulmonary edema, likely from congestive heart failure. 5. Morphologic changes in the liver suggestive of early cirrhosis. 6. Additional incidental findings, as above.   Electronically Signed   By: Vinnie Langton M.D.    On: 04/07/2017 16:34   STS Risk Calculator  Procedure: AVR + CAB CALCULATE  Risk of Mortality:  7.184%   Renal Failure:  7.184%   Permanent Stroke:  2.239%   Prolonged Ventilation:  18.574%   DSW Infection:  0.202%   Reoperation:  5.767%   Morbidity or Mortality:  22.573%   Short Length of Stay:  8.046%   Long Length of Stay:  19.646%       Impression:  Patient has stage D severe symptomatic aortic stenosis.  She presents with stable but progressive symptoms of exertional shortness of breath and chest tightness consistent with angina and chronic diastolic congestive heart failure, New York Heart Association functional class II.  I have personally reviewed the patient's recent transthoracic echocardiogram, diagnostic cardiac catheterization, and CT angiograms.  Echocardiogram confirms the presence of severe aortic stenosis.  The aortic valve is trileaflet with severe thickening, calcification, and restricted leaflet mobility involving all 3 leaflets of the aortic valve.  Peak velocity across the aortic valve was affected by irregular RR interval due to the patient's atrial fibrillation but ranged between 4.1 and 5.0 m/s corresponding to mean transvalvular gradient estimated at least 49 mmHg.  Left ventricular systolic function remains normal.  Diagnostic cardiac catheterization reveals multivessel coronary artery disease with mild nonobstructive disease in the left anterior descending coronary artery and left circumflex territories, high-grade stenosis of a diagonal branch, and moderate stenosis of the distal right coronary artery.  Risks associated with conventional surgical aortic valve replacement with or without coronary artery bypass grafting would be relatively high in this elderly patient.  Cardiac-gated CTA of the heart reveals anatomical characteristics consistent with aortic stenosis suitable for treatment by transcatheter aortic valve replacement without any significant complicating  features and CTA of the aorta and iliac vessels demonstrate what appears to be adequate pelvic vascular access to facilitate a transfemoral approach.   Cardiac gated CT of the heart did reveal what might be a small clot in the tip of the left atrial appendage.  Under the circumstances I agree that it would be very reasonable to treat the patient's aortic stenosis with transcatheter aortic valve replacement and continue to treat the patient's coronary artery disease medically.   Plan:  The patient and her daughter were counseled at length regarding treatment alternatives for management of severe symptomatic aortic stenosis and moderate coronary artery disease. Alternative approaches such as conventional aortic valve replacement with or without coronary artery bypass grafting, transcatheter aortic valve replacement, and palliative medical therapy were compared and contrasted at  length.  The risks associated with conventional surgical aortic valve replacement were been discussed in detail, as were expectations for post-operative convalescence, and why I would be reluctant to consider this patient a candidate for conventional surgery.  Issues specific to transcatheter aortic valve replacement were discussed including questions about long term valve durability, the potential for paravalvular leak, possible increased risk of need for permanent pacemaker placement, and other technical complications related to the procedure itself.  Long-term prognosis with medical therapy was discussed. This discussion was placed in the context of the patient's own specific clinical presentation and past medical history.  All of their questions been addressed.  The patient hopes to proceed with transcatheter aortic valve replacement in the near future, this has tentatively been scheduled for May 12, 2017.  Following the decision to proceed with transcatheter aortic valve replacement, a discussion has been held regarding what  types of management strategies would be attempted intraoperatively in the event of life-threatening complications, including whether or not the patient would be considered a candidate for the use of cardiopulmonary bypass and/or conversion to open sternotomy for attempted surgical intervention.  The patient has been advised of a variety of complications that might develop including but not limited to risks of death, stroke, paravalvular leak, aortic dissection or other major vascular complications, aortic annulus rupture, device embolization, cardiac rupture or perforation, mitral regurgitation, acute myocardial infarction, arrhythmia, heart block or bradycardia requiring permanent pacemaker placement, congestive heart failure, respiratory failure, renal failure, pneumonia, infection, other late complications related to structural valve deterioration or migration, or other complications that might ultimately cause a temporary or permanent loss of functional independence or other long term morbidity.  The patient provides full informed consent for the procedure as described and all questions were answered.   I spent in excess of 90 minutes during the conduct of this office consultation and >50% of this time involved direct face-to-face encounter with the patient for counseling and/or coordination of their care.    Valentina Gu. Roxy Manns, MD 04/17/2017 2:08 PM

## 2017-04-17 NOTE — Therapy (Signed)
[name], [place], [address] Phone: [phone]   Fax:  [phone]  Physical Therapy Evaluation  Patient Details  Name: Theresa Cain MRN: [id number] Date of Birth: 02/13/1927 Referring Provider: Dr. Sherren Mocha   Encounter Date: 04/17/2017  PT End of Session - 04/17/17 1040    Visit Number  1    PT Start Time  7672    PT Stop Time  1115    PT Time Calculation (min)  36 min    Equipment Utilized During Treatment  Gait belt       Past Medical History:  Diagnosis Date  . Anxiety   . Bradycardia   . Carotid artery occlusion   . Chronic diastolic CHF (congestive heart failure) ([place])   . GERD (gastroesophageal reflux disease)   . HLD (hyperlipidemia)   . Hypertension   . Obesity   . Osteoarthritis   . Permanent atrial fibrillation ([place])   . Presence of permanent cardiac pacemaker   . PVD (peripheral vascular disease) ([place])   . Severe aortic stenosis   . Stomach cancer ([place]) ~ 2013  . Vertigo     Past Surgical History:  Procedure Laterality Date  . CARDIOVASCULAR STRESS TEST    . CARDIOVERSION N/A 08/08/2016   Procedure: CARDIOVERSION;  Surgeon: Sueanne Margarita, MD;  Location: [facility] ENDOSCOPY;  Service: Cardiovascular;  Laterality: N/A;  . CAROTID ENDARTERECTOMY Right ~ 2005  . CATARACT EXTRACTION W/ INTRAOCULAR LENS IMPLANT Right   . CHOLECYSTECTOMY  ~ 2013   "part of her Whipple OR"  . EP IMPLANTABLE DEVICE N/A 03/04/2016   Procedure: Pacemaker Implant;  Surgeon: Will Meredith Leeds, MD;  Location: [facility] CV LAB;  Service: Cardiovascular;  Laterality: N/A;  . EP IMPLANTABLE DEVICE N/A 03/05/2016   Procedure: Lead Revision/Repair;  Surgeon: Deboraha Sprang, MD;  Location: [facility] CV LAB;  Service: Cardiovascular;  Laterality: N/A;  . INSERT / REPLACE / REMOVE PACEMAKER    . LEFT HEART CATH AND CORONARY ANGIOGRAPHY N/A 03/27/2017   Procedure: LEFT HEART CATH AND CORONARY ANGIOGRAPHY;   Surgeon: Minna Merritts, MD;  Location: [facility] CV LAB;  Service: Cardiovascular;  Laterality: N/A;  . US ECHOCARDIOGRAPHY    . VAGINAL HYSTERECTOMY    . WHIPPLE PROCEDURE  ~ 2013    There were no vitals filed for this visit.   Subjective Assessment - 04/17/17 1042    Subjective  reports shortness of breath with light activity and occasional chest tightness    Currently in Pain?  No/denies         [facility] PT Assessment - 04/17/17 0001      Assessment   Medical Diagnosis  severe aortic stenosis    Referring Provider  Dr. Sherren Mocha    Onset Date/Surgical Date  -- approximately 12 months ago      Precautions   Precautions  None      Restrictions   Weight Bearing Restrictions  No      Balance Screen   Has the patient fallen in the past 6 months  No    Has the patient had a decrease in activity level because of a fear of falling?   No    Is the patient reluctant to leave their home because of a fear of falling?   No      Home Film/video editor residence    [place]  Stairs to enter    [employer] of Steps  4    Entrance Stairs-Rails  Right    Home Layout  One level      Prior Function   Level of Independence  Independent with community mobility without device      Posture/Postural Control   Posture/Postural Control  Postural limitations    Postural Limitations  Rounded Shoulders;Forward head      ROM / Strength   AROM / PROM / Strength  AROM;Strength      AROM   Overall AROM Comments  grossly WNL      Strength   Overall Strength Comments  grossly 4-5/5    Strength Assessment Site  Hand    Right/Left hand  Right;Left    Right Hand Grip (lbs)  35 R hand dominant    Left Hand Grip (lbs)  34      Ambulation/Gait   Gait Comments  Pt ambulated with single point cane for testing. Gait distance is limited by 49% for age/gender.        [facility] Pre-Surgical Assessment - 04/17/17 0001    5  Meter Walk Test- trial 1  9 sec    5 Meter Walk Test- trial 2  9 sec.     5 Meter Walk Test- trial 3  7 sec.    5 meter walk test average  8.33 sec    4 Stage Balance Test tolerated for:   10 sec.    4 Stage Balance Test Position  2    Comment  unable without UE support    ADL/IADL Independent with:  Bathing;Dressing;Finances;Meal prep    ADL/IADL Needs Assistance with:  [employer] work    ADL/IADL Fraility Index  Vulnerable    6 Minute Walk- Baseline  yes    BP (mmHg)  144/69    HR (bpm)  62    02 Sat (%RA)  97 %    Modified Borg Scale for Dyspnea  0- Nothing at all    Perceived Rate of Exertion (Borg)  6-    6 Minute Walk Post Test  yes    BP (mmHg)  163/80    HR (bpm)  86    02 Sat (%RA)  93 %    Modified Borg Scale for Dyspnea  3- Moderate shortness of breath or breathing difficulty    Perceived Rate of Exertion (Borg)  9- very light    Aerobic Endurance Distance Walked  655           Objective measurements completed on examination: See above findings.              PT Education - 04/17/17 1252    Education provided  Yes    Education Details  fall risk, use of assistive device in community versus holding to family member    Person(s) Educated  Patient;Child(ren)    Methods  Explanation    Comprehension  Verbalized understanding                  Plan - 04/17/17 1252    Clinical Impression Statement  see below    PT Frequency  One time visit      Clinical Impression Statement: Pt is an 84 yo female presenting to OP PT for evaluation prior to possible TAVR surgery due to severe aortic stenosis. Pt reports onset of a gradual slowing down, fatigue, shortness of breath with light activity approximately 12 months ago. Pt presents with good ROM and  strength, poor balance and is at high fall risk 4 stage balance test, slow walking speed and poor aerobic endurance per 6 minute walk test. Pt ambulated a total of 655 feet in 6 minute walk. Based on the Short  Physical Performance Battery, patient has a frailty rating of 3/12 with </= 5/12 considered frail.   Patient demonstrated the following deficits and impairments:     Visit Diagnosis: Other abnormalities of gait and mobility     Problem List Patient Active Problem List   Diagnosis Date Noted  . Torsades de pointes ([place]) 03/24/2017  . Anginal equivalent ([place]) 03/23/2017  . Chronic diastolic CHF (congestive heart failure) ([place]) 03/23/2017  . HTN (hypertension) 03/23/2017  . Atrial fibrillation, chronic ([place]) 03/23/2017  . GERD (gastroesophageal reflux disease) 03/23/2017  . Anxiety 03/23/2017  . Persistent atrial fibrillation ([place])   . Pacemaker lead failure 03/05/2016  . Tachy-brady syndrome ([place]) 03/04/2016  . Acute on chronic diastolic CHF (congestive heart failure) ([place]) 02/22/2016    [place], PT 04/17/2017, 12:53 PM  [facility] [address] [place], [place], [address] Phone: [phone]   Fax:  [phone]  Name: Velvia Mehrer MRN: [id number] Date of Birth: 05-29-1926

## 2017-04-17 NOTE — H&P (View-Only) (Signed)
[facility]  CARDIOTHORACIC SURGERY CONSULTATION REPORT  Referring Provider is Gollan, Kathlene November, MD PCP is Vasireddy, Lanetta Inch, MD  Chief Complaint  Patient presents with  . 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  The patient has been told that she had a heart murmur for many years.  The patient's cardiac history otherwise dates back to 2014 when she underwent Whipple pancreaticoduodenectomy.  Her hospitalization was prolonged and immediately following hospital discharge she suffered a "light" heart attack.  At the time she was treated at [facility].  She was followed for a period of time by Dr. Clayborn Bigness in the [facility].  In 2017 she developed dizzy spells and a Holter monitor revealed findings consistent with symptomatic bradycardia with tachybradycardia syndrome.  She underwent permanent pacemaker placement in December 2017 with subsequent revision of the pacemaker secondary to lead dislodgment.  She did well until recently when she was hospitalized at [facility] with generalized weakness, diaphoresis, chest discomfort, and shortness of breath.  Telemetry revealed sustained ventricular tachycardia with torsades, frequent PVCs, and runs of nonsustained VT.   She was noted to have severely depleted potassium level.  Her pacemaker was reprogrammed and potassium replaced.  Diagnostic cardiac catheterization was performed revealed multivessel coronary artery disease with 40% stenosis of the left anterior descending coronary artery, high-grade stenosis of a diagonal branch, and 75% stenosis of the distal right coronary artery.  The aortic valve could not be crossed.  Right heart catheterization was not performed.  Echocardiogram revealed normal left ventricular systolic function with severe aortic stenosis.  Peak velocity across the aortic valve range between 4.1 and 5 m/s corresponding to mean transvalvular gradient estimated 49 mmHg.  She had mild to moderate mitral regurgitation, severe left atrial enlargement, and moderate tricuspid regurgitation with moderate pulmonary hypertension.  Her coronary artery disease was treated medically and she was referred to Dr. Burt Knack who saw her in consultation on April 01, 2017.  The patient has subsequently undergone CT angiography and been referred for surgical consultation.  The patient has been widowed for approximately 11 years and lives alone in [place] in [place].  She has 3 adult children including a daughter who is very supportive and accompanies her for her office consultation today.  The patient has remained reasonably active physically and entirely functionally independent until recently.  She still drives an automobile, tends to chores around the house and outdoors in her yard when the weather permits.  She describes a gradual decline with decreased energy and exertional shortness of breath.  She does not get short of breath with light chores but only with more sustained activity.  She denies any PND, orthopnea, or resting shortness of breath.  She has had some exertional tightness across her chest which is always relieved by rest.  She has had some lower extremity swelling which has improved.  She had dizzy  spells at the time of her recent  hospitalization and associated with her irregular heart rhythms.  She has not had dizzy spells since hospital discharge.  She remains fully ambulatory although her balance is somewhat limited and she uses a cane for stability.  She has problems with arthritis in her right knee and her lower back.  Past Medical History:  Diagnosis Date  . Anxiety   . Bradycardia   . Carotid artery occlusion   . Chronic diastolic CHF (congestive heart failure) ([place])   . Coronary artery disease involving native coronary artery of native heart   . GERD (gastroesophageal reflux disease)   . HLD (hyperlipidemia)   . Hypertension   . Obesity   . Osteoarthritis   . Permanent atrial fibrillation ([place])   . Presence of permanent cardiac pacemaker   . PVD (peripheral vascular disease) ([place])   . Severe aortic stenosis   . Stomach cancer ([place]) ~ 2013  . Vertigo     Past Surgical History:  Procedure Laterality Date  . CARDIOVASCULAR STRESS TEST    . CARDIOVERSION N/A 08/08/2016   Procedure: CARDIOVERSION;  Surgeon: Sueanne Margarita, MD;  Location: [facility] ENDOSCOPY;  Service: Cardiovascular;  Laterality: N/A;  . CAROTID ENDARTERECTOMY Right ~ 2005  . CATARACT EXTRACTION W/ INTRAOCULAR LENS IMPLANT Right   . CHOLECYSTECTOMY  ~ 2013   "part of her Whipple OR"  . EP IMPLANTABLE DEVICE N/A 03/04/2016   Procedure: Pacemaker Implant;  Surgeon: Will Meredith Leeds, MD;  Location: [place] [facility];  Service: Cardiovascular;  Laterality: N/A;  . EP IMPLANTABLE DEVICE N/A 03/05/2016   Procedure: Lead Revision/Repair;  Surgeon: Deboraha Sprang, MD;  Location: [place] [facility];  Service: Cardiovascular;  Laterality: N/A;  . INSERT / REPLACE / REMOVE PACEMAKER    . LEFT HEART CATH AND CORONARY ANGIOGRAPHY N/A 03/27/2017   Procedure: LEFT HEART CATH AND CORONARY ANGIOGRAPHY;  Surgeon: Minna Merritts, MD;  Location: [place] [facility];  Service: Cardiovascular;  Laterality: N/A;  . US  ECHOCARDIOGRAPHY    . VAGINAL HYSTERECTOMY    . WHIPPLE PROCEDURE  ~ 2013    Family History  Problem Relation Age of Onset  . Heart disease Sister     Social History   Socioeconomic History  . Marital status: Widowed    Spouse name: Not on file  . Number of children: Not on file  . Years of education: Not on file  . Highest education level: Not on file  Social Needs  . Financial resource strain: Not on file  . Food insecurity - worry: Not on file  . Food insecurity - inability: Not on file  . Transportation needs - medical: Not on file  . Transportation needs - non-medical: Not on file  Occupational History  . Not on file  Tobacco Use  . Smoking status: Never Smoker  . Smokeless tobacco: Never Used  Substance and Sexual Activity  . Alcohol use: No  . Drug use: No  . Sexual activity: No  Other Topics Concern  . Not on file  Social History Narrative  . Not on file    Current Outpatient Medications  Medication Sig Dispense Refill  . acetaminophen (TYLENOL) 500 MG tablet Take 1,000 mg by mouth every 6 (six) hours as needed for moderate pain.    Marland Kitchen apixaban (ELIQUIS) 5 MG TABS tablet Take 5 mg by mouth 2 (two) times daily.    Marland Kitchen diltiazem (CARDIZEM CD) 180 MG 24 hr capsule Take 1 capsule (180 mg total) by mouth daily.  90 capsule 3  . escitalopram (LEXAPRO) 10 MG tablet Take 10 mg by mouth at bedtime.     . ferrous sulfate 325 (65 FE) MG tablet Take 325 mg by mouth daily with breakfast.    . gabapentin (NEURONTIN) 600 MG tablet Take 600 mg by mouth 2 (two) times daily.     Marland Kitchen levothyroxine (SYNTHROID) 50 MCG tablet Take 1 tablet (50 mcg total) by mouth daily. 30 tablet 0  . omeprazole (PRILOSEC) 20 MG capsule Take 20 mg by mouth daily.    . potassium chloride (K-DUR) 10 MEQ tablet Take 1 tablet (10 mEq total) by mouth 2 (two) times daily. 60 tablet 0  . pravastatin (PRAVACHOL) 40 MG tablet Take 40 mg by mouth every evening.     . torsemide (DEMADEX) 20 MG tablet Take 1  tablet (20 mg total) by mouth daily. Take one additional tablet daily as needed for swelling. 45 tablet 11  . Vitamin D, Ergocalciferol, (DRISDOL) 50000 units CAPS capsule Take 50,000 Units by mouth 2 (two) times a week. Take on Sunday and Thursday     No current facility-administered medications for this visit.     Allergies  Allergen Reactions  . Penicillins Hives and Other (See Comments)    Has patient had a PCN reaction causing immediate rash, facial/tongue/throat swelling, SOB or lightheadedness with hypotension: Yes Has patient had a PCN reaction causing severe rash involving mucus membranes or skin necrosis: No Has patient had a PCN reaction that required hospitalization: No Has patient had a PCN reaction occurring within the last 10 years: No If all of the above answers are "NO", then may proceed with Cephalosporin use.   . Sulfa Antibiotics Hives      Review of Systems:   General:  normal appetite, decreased energy, no weight gain, no weight loss, no fever  Cardiac:  + chest pain with exertion, no chest pain at rest, + SOB with exertion, no resting SOB, no PND, no orthopnea, no palpitations, + arrhythmia, + atrial fibrillation, + LE edema, + dizzy spells, no syncope  Respiratory:  + shortness of breath, NO home oxygen, NO productive cough, NO dry cough, NO bronchitis, NO wheezing, NO hemoptysis, NO asthma, NO pain with inspiration or cough, NO sleep apnea, NO CPAP at night  GI:   no difficulty swallowing, no reflux, no frequent heartburn, no hiatal hernia, no abdominal pain, no constipation, no diarrhea, no hematochezia, no hematemesis, no melena  GU:   no dysuria,  + frequency, no urinary tract infection, no hematuria, no kidney stones, + kidney disease  Vascular:  no pain suggestive of claudication, + pain in feet, no leg cramps, + varicose veins, no DVT, no non-healing foot ulcer  Neuro:   no stroke, no TIA's, no seizures, no headaches, no temporary blindness one eye,  no  slurred speech, + peripheral neuropathy, + chronic pain, + mild instability of gait, no memory/cognitive dysfunction  Musculoskeletal: + arthritis, no joint swelling, no myalgias, + some difficulty walking, mildly limited mobility   Skin:   no rash, no itching, no skin infections, no pressure sores or ulcerations  Psych:   no anxiety, no depression, no nervousness, no unusual recent stress  Eyes:   no blurry vision, no floaters, no recent vision changes, no wears glasses or contacts  ENT:   no hearing loss, no loose or painful teeth, + dentures, last saw dentist November 2017  Hematologic:  + easy bruising, no abnormal bleeding, no clotting disorder, no frequent epistaxis  Endocrine:  no diabetes, does not check CBG's at home           Physical Exam:   BP (!) 145/70 (BP Location: Left Arm, Patient Position: Sitting, Cuff Size: Large)   Pulse 72   Resp 18   Ht 6' (1.829 m)   Wt 211 lb 6.4 oz (95.9 kg)   SpO2 96%   BMI 28.67 kg/m   General:  Elderly, mildly obese, otherwise  well-appearing  HEENT:  Unremarkable   Neck:   no JVD, no bruits, no adenopathy   Chest:   clear to auscultation, symmetrical breath sounds, no wheezes, no rhonchi   CV:   Irregular rate and rhythm, grade III/VI crescendo/decrescendo murmur heard best at RSB,  no diastolic murmur  Abdomen:  soft, non-tender, no masses   Extremities:  warm, well-perfused, pulses palpable, no LE edema  Rectal/GU  Deferred  Neuro:   Grossly non-focal and symmetrical throughout  Skin:   Clean and dry, no rashes, no breakdown   Diagnostic Tests:  Transthoracic Echocardiography  Patient:    Alisa, Stjames MR #:       [id number] Study Date: 03/24/2017 Gender:     F Age:        74 Height:     182.9 cm Weight:     93.1 kg BSA:        2.19 m^2 Pt. Status: Room:       238A   ADMITTING    Rhett Bannister  REFERRING    Ethlyn Daniels  SONOGRAPHER  Cindy Hazy, RDCS  ATTENDING    Manuella Ghazi,  Vipul S  PERFORMING   [facility], [facility]  cc:  ------------------------------------------------------------------- LV EF: 60% -   65%  ------------------------------------------------------------------- Indications:      R94.31 Abnormal EKG.  ------------------------------------------------------------------- History:   PMH:  Acquired from the patient and from the patient&'s chart.  PMH:  Atrial Fibrillation. Anxiety. Bilateral lower extremity edema. Bradycardia. Murmur. Congestive heart failure. Peripheral vascular disease. Shortness of breath.  Risk factors: Hypertension. Dyslipidemia.  ------------------------------------------------------------------- Study Conclusions  - Left ventricle: The cavity size was normal. There was mild   concentric hypertrophy. Systolic function was normal. The   estimated ejection fraction was in the range of 60% to 65%. Wall   motion was normal; there were no regional wall motion   abnormalities. The study is not technically sufficient to allow   evaluation of LV diastolic function. - Aortic valve: There was severe stenosis. Peak velocity (S): 448   cm/s. Mean gradient (S): 49 mm Hg. - Mitral valve: There was mild to moderate regurgitation. - Left atrium: The atrium was moderately dilated. - Right ventricle: Systolic function was normal. - Tricuspid valve: There was moderate regurgitation. - Pulmonary arteries: Systolic pressure was moderate to severely   elevated. PA peak pressure: 60 mm Hg (S).  Impressions:  - Paced rhythm with underlying atrial fibrillation.  ------------------------------------------------------------------- Study data:   Study status:  Routine.  Procedure:  The patient reported no pain pre or post test. Transthoracic echocardiography for left ventricular function evaluation, for right ventricular function evaluation, and for assessment of valvular function. Image quality was adequate.  Study completion:  There were  no complications.          Transthoracic echocardiography.  M-mode, complete 2D, spectral Doppler, and color Doppler.  Birthdate: Patient birthdate: 08/19/1926.  Age:  Patient is 84 yr old.  Sex: Gender: female.    BMI:  27.8 kg/m^2.  Blood pressure:     158/59 Patient status:  Inpatient.  Study date:  Study date: 03/24/2017. Study time: 11:46 AM.  Location:  Echo laboratory.  -------------------------------------------------------------------  ------------------------------------------------------------------- Left ventricle:  The cavity size was normal. There was mild concentric hypertrophy. Systolic function was normal. The estimated ejection fraction was in the range of 60% to 65%. Wall motion was normal; there were no regional wall motion abnormalities. The study is not technically sufficient to allow evaluation of LV diastolic function.  ------------------------------------------------------------------- Aortic valve:   Trileaflet; normal thickness, severely calcified leaflets. Mobility was not restricted.  Doppler:   There was severe stenosis.   There was no regurgitation.    VTI ratio of LVOT to aortic valve: 0.3. Valve area (VTI): 1.03 cm^2. Indexed valve area (VTI): 0.47 cm^2/m^2. Peak velocity ratio of LVOT to aortic valve: 0.26. Indexed valve area (Vmax): 0.42 cm^2/m^2. Mean velocity ratio of LVOT to aortic valve: 0.29. Valve area (Vmean): 0.99 cm^2. Indexed valve area (Vmean): 0.45 cm^2/m^2.    Mean gradient (S): 49 mm Hg. Peak gradient (S): 80 mm Hg.  ------------------------------------------------------------------- Aorta:  Aortic root: The aortic root was normal in size.  ------------------------------------------------------------------- Mitral valve:   Structurally normal valve.   Mobility was not restricted.  Doppler:  Transvalvular velocity was within the normal range. There was no evidence for stenosis. There was mild to moderate regurgitation.    Peak  gradient (D): 13 mm Hg.  ------------------------------------------------------------------- Left atrium:  The atrium was moderately dilated.  ------------------------------------------------------------------- Right ventricle:  The cavity size was normal. Wall thickness was normal. Systolic function was normal.  ------------------------------------------------------------------- Pulmonic valve:    Structurally normal valve.   Cusp separation was normal.  Doppler:  Transvalvular velocity was within the normal range. There was no evidence for stenosis. There was no regurgitation.  ------------------------------------------------------------------- Tricuspid valve:   Structurally normal valve.    Doppler: Transvalvular velocity was within the normal range. There was moderate regurgitation.  ------------------------------------------------------------------- Pulmonary artery:   The main pulmonary artery was normal-sized. Systolic pressure was moderate to severely elevated.  ------------------------------------------------------------------- Right atrium:  The atrium was normal in size.  ------------------------------------------------------------------- Pericardium:  There was no pericardial effusion.  ------------------------------------------------------------------- Systemic veins: Inferior vena cava: The vessel was normal in size.  ------------------------------------------------------------------- Measurements   Left ventricle                            Value          Reference  LV ID, ED, PLAX chordal                   46.5  mm       43 - 52  LV ID, ES, PLAX chordal                   27.7  mm       23 - 38  LV fx shortening, PLAX chordal            40    %        >=29  LV PW thickness, ED                       14.4  mm       ---------  IVS/LV PW ratio, ED  1.02           <=1.3  Stroke volume, 2D                         115   ml        ---------  Stroke volume/bsa, 2D                     52    ml/m^2   ---------  LV e&', lateral                            7.62  cm/s     ---------  LV E/e&', lateral                          23.62          ---------  LV e&', medial                             8.27  cm/s     ---------  LV E/e&', medial                           21.77          ---------  LV e&', average                            7.95  cm/s     ---------  LV E/e&', average                          22.66          ---------    Ventricular septum                        Value          Reference  IVS thickness, ED                         14.7  mm       ---------    LVOT                                      Value          Reference  LVOT ID, S                                21    mm       ---------  LVOT area                                 3.46  cm^2     ---------  LVOT ID                                   21    mm       ---------  LVOT peak velocity, S  116   cm/s     ---------  LVOT mean velocity, S                     93    cm/s     ---------  LVOT VTI, S                               33.3  cm       ---------  LVOT peak gradient, S                     5     mm Hg    ---------  Stroke volume (SV), LVOT DP               115.3 ml       ---------  Stroke index (SV/bsa), LVOT DP            52.6  ml/m^2   ---------    Aortic valve                              Value          Reference  Aortic valve peak velocity, S             448   cm/s     ---------  Aortic valve mean velocity, S             325   cm/s     ---------  Aortic valve VTI, S                       112   cm       ---------  Aortic mean gradient, S                   49    mm Hg    ---------  Aortic peak gradient, S                   80    mm Hg    ---------  VTI ratio, LVOT/AV                        0.3            ---------  Aortic valve area, VTI                    1.03  cm^2     ---------  Aortic valve area/bsa, VTI                0.47  cm^2/m^2  ---------  Velocity ratio, peak, LVOT/AV             0.26           ---------  Aortic valve area/bsa, peak               0.42  cm^2/m^2 ---------  velocity  Velocity ratio, mean, LVOT/AV             0.29           ---------  Aortic valve area, mean velocity          0.99  cm^2     ---------  Aortic valve area/bsa, mean  0.45  cm^2/m^2 ---------  velocity  Aortic regurg pressure half-time          489   ms       ---------    Aorta                                     Value          Reference  Aortic root ID, ED                        34    mm       ---------    Left atrium                               Value          Reference  LA ID, A-P, ES                            65    mm       ---------  LA ID/bsa, A-P                    (H)     2.97  cm/m^2   <=2.2  LA volume, S                              154   ml       ---------  LA volume/bsa, S                          70.3  ml/m^2   ---------  LA volume, ES, 1-p A4C                    137   ml       ---------  LA volume/bsa, ES, 1-p A4C                62.5  ml/m^2   ---------  LA volume, ES, 1-p A2C                    157   ml       ---------  LA volume/bsa, ES, 1-p A2C                71.7  ml/m^2   ---------    Mitral valve                              Value          Reference  Mitral E-wave peak velocity               180   cm/s     ---------  Mitral deceleration time                  176   ms       150 - 230  Mitral peak gradient, D                   13    mm Hg    ---------    Pulmonary arteries  Value          Reference  PA pressure, S, DP                (H)     60    mm Hg    <=30    Right ventricle                           Value          Reference  TAPSE                                     17.3  mm       ---------  Legend: (L)  and  (H)  mark values outside specified reference range.  ------------------------------------------------------------------- Prepared and Electronically  Authenticated by  Esmond Plants, MD, [facility] 2019-01-01T13:35:39   LEFT HEART CATH AND CORONARY ANGIOGRAPHY  Conclusion    Prox LAD lesion is 40% stenosed.  Prox LAD to Mid LAD lesion is 40% stenosed.  Dist RCA lesion is 75% stenosed.  Post Atrio lesion is 40% stenosed.    Procedural Details/Technique   Technical Details Cardiac Catheterization Procedure Note  Name: Josslyn Ciolek MRN: [id number] DOB: 07/07/1926  Procedure: Left Heart Cath, Selective Coronary Angiography, LV angiography  Indication:  84 y.o. female with history of permanent Afib on Eliquis, tachybrady syndrome s/p MDT PPM, chronic diastolic CHF, severe aortic valve stenosis, HTN, HLD, and PVD who presented to [facility] with weakness/flushing. VT/torsades seen on monitor, potassium of 3.0. Cradiac cath for VT, torsades, severe aortic valve stenosis, preop/candidate for TAVR   Procedural details: The right groin was prepped, draped, and anesthetized with 1% lidocaine. Using modified Seldinger technique, a 5 French sheath was introduced into the right femoral artery. Standard Judkins catheters (JL 5, JR 5 and pigtail catheter) were used for coronary angiography and left ventriculography. Catheter exchanges were performed over a guidewire. There were no immediate procedural complications. The patient was transferred to the post catheterization recovery area for further monitoring.  Moderate sedation: I was Face to Face with the patient during this time: (code: [phone])   Procedural Findings:  Coronary dominance: Right  Left mainstem: Large vessel that bifurcates into the LAD and left circumflex, no significant disease noted  Left anterior descending (LAD): Large vessel that extends to the apical region, diagonal branch 1 of moderate size, proximal to mid sequential lesions estimated at 40 to 50%.  Left circumflex (LCx): Large vessel with OM branch 2, no significant disease noted. Mild lumenal irregularities,  calcified  Right coronary artery (RCA): Right dominant vessel with PL and PDA, moderate to severe distal RCA disease prior to bifurcation. Mild calcified proximal disease. PDA disease, moderate/focal  Left ventriculography: unable to cross aortic valve for pressures  Final Conclusions:  Moderate to severe distal RCA disease, Mild to Moderate mid LAD disease Medical management at this time  Recommendations:  Asa and statin, b-blocker We will refer for evaluation of TAVR given severe aortic valve stenosis on echo  Timothy Gollan 03/27/2017, 9:20 AM   Estimated blood loss <50 mL.  During this procedure the patient was administered the following to achieve and maintain moderate conscious sedation: Versed 1 mg, Fentanyl 25 mcg, while the patient's heart rate, blood pressure, and oxygen saturation were continuously monitored. The period of conscious sedation was 30 minutes, of which I was present face-to-face 100% of  this time.  Coronary Findings   Diagnostic  Dominance: Right  Left Anterior Descending  Prox LAD lesion 40% stenosed  Prox LAD lesion is 40% stenosed. The lesion is calcified.  Prox LAD to Mid LAD lesion 40% stenosed  Prox LAD to Mid LAD lesion is 40% stenosed.  Right Coronary Artery  Dist RCA lesion 75% stenosed  Dist RCA lesion is 75% stenosed.  Right Posterior Atrioventricular Branch  Post Atrio lesion 40% stenosed  Post Atrio lesion is 40% stenosed.  Intervention   No interventions have been documented.  Coronary Diagrams   Diagnostic Diagram       Implants     Vascular Products  Device Closure Mynxgrip 61f [phone] - Implanted    Inventory item: Device Closure Mynxgrip 83f Model/Cat number: [id number]  Manufacturer: [employer] Lot number: [id number]  Device identifier: [id number] Device identifier type: GS1  Area Of Implantation: Groin    GUDID Information   Request status Successful    Brand name: [facility] Version/Model: IR5188  Company  name: [employer]. MRI safety info as of 03/27/17: Labeling does not contain MRI Safety Information  Contains dry or latex rubber: No    GMDN P.T. name: Wound hydrogel dressing, sterile    As of 03/27/2017   Status: Implanted      MERGE Images   Show images for CARDIAC CATHETERIZATION   Link to Procedure Log   Procedure Log    Hemo Data 03/27/17 0830--03/27/17 0923 before discharge   AO Systolic Cath Pressure AO Diastolic Cath Pressure AO Mean Cath Pressure  142 59 mmHg 92 mmHg  146 52 mmHg 88 mmHg  142 57 mmHg 88 mmHg     Cardiac TAVR CT  TECHNIQUE: The patient was scanned on a [employer]. A 120 kV retrospective scan was triggered in the descending thoracic aorta at 111 HU's. Gantry rotation speed was 250 msecs and collimation was .6 mm. No beta blockade or nitro were given. The 3D data set was reconstructed in 5% intervals of the R-R cycle. Systolic and diastolic phases were analyzed on a dedicated work station using MPR, MIP and VRT modes. The patient received 80 cc of contrast.  FINDINGS: Aortic Valve: Trileaflet aortic valve with severely thickened and calcified valves and minimal calcifications extending into the LVOT.  Aorta: Mildly aneurysmal ascending aorta, mild diffuse calcifications, no dissection.  Sinotubular Junction: 28 x 28 mm  Ascending Thoracic Aorta: Maximum diameter 40 x 39 mm  Aortic Arch: 28 x 25 mm  Descending Thoracic Aorta: 24 x 23 mm  Sinus of Valsalva Measurements:  Non-coronary: 31 mm  Right -coronary: 30 mm  Left -coronary: 31 mm  Sinus of Valsalva Height:  Right -coronary: 21 mm  Left -coronary: 22 mm  Coronary Artery Height above Annulus:  Left Main: 16 mm  Right Coronary: 17 mm  Virtual Basal Annulus Measurements:  Maximum/Minimum Diameter: 24.6 x 22.3 mm  Mean Diameter: 23.2 mm  Perimeter: 74 mm  Area: 422 mm2  Optimum Fluoroscopic Angle for Delivery: LAO 8 CAU  6  IMPRESSION: 1. Trileaflet aortic valve with severely thickened and calcified valves and minimal calcifications extending into the LVOT. Annular measurements suitable for a 26 mm Edwards-SAPIEN 3 valve or a 29 mm Evolut R Medtronic valve.  2. Sufficient coronary to annulus distance.  3. Optimum Fluoroscopic Angle for Delivery: LAO 8 CAU 6.  4. Left atrial appendage is large, multilobular with a filling defect at the tip of the largest lobe measuring 30  x 17 mm. This most probably represents a thrombus (the defect is seen on early and delayed contrast images).  5. Mildly dilated pulmonary artery measuring 33 mm suggestive of pulmonary hypertension.   Electronically Signed   By: Ena Dawley   On: 04/07/2017 20:08    CT ANGIOGRAPHY CHEST, ABDOMEN AND PELVIS  TECHNIQUE: Multidetector CT imaging through the chest, abdomen and pelvis was performed using the standard protocol during bolus administration of intravenous contrast. Multiplanar reconstructed images and MIPs were obtained and reviewed to evaluate the vascular anatomy.  CONTRAST:  60 mL of Isovue 370.  COMPARISON:  None.  FINDINGS: CTA CHEST FINDINGS  Cardiovascular: Heart size is enlarged with biatrial dilatation. 3 x 1.4 cm filling defect in the tip of the left atrial appendage (axial image 52 of series 15) concerning for potential left atrial appendage thrombus. There is no significant pericardial fluid, thickening or pericardial calcification. There is aortic atherosclerosis, as well as atherosclerosis of the great vessels of the mediastinum and the coronary arteries, including calcified atherosclerotic plaque in the left main, left anterior descending, left circumflex and right coronary arteries. Severe thickening calcification of the aortic valve. Mild calcification of the mitral annulus. Left sided pacemaker with lead tips terminating in the right atrial appendage and along the free  wall of the right ventricle near the right ventricular apex.  Mediastinum/Lymph Nodes: No pathologically enlarged mediastinal or hilar lymph nodes. Esophagus is unremarkable in appearance. No axillary lymphadenopathy.  Lungs/Pleura: Diffuse areas of ground-glass attenuation and interlobular septal thickening, suggesting a background of mild interstitial pulmonary edema. No confluent consolidative airspace disease. No pleural effusions. No definite suspicious appearing pulmonary nodules or masses.  Musculoskeletal/Soft Tissues: There are no aggressive appearing lytic or blastic lesions noted in the visualized portions of the skeleton.  CTA ABDOMEN AND PELVIS FINDINGS  Hepatobiliary: Liver has a slightly shrunken appearance and nodular contour, indicative of early changes of cirrhosis. No discrete cystic or solid hepatic lesions. No intra or extrahepatic biliary ductal dilatation. Status post cholecystectomy.  Pancreas: Status post Whipple procedure. Remaining body and tail of the pancreas are normal in appearance. No pancreatic or peripancreatic fluid or inflammatory changes.  Spleen: Unremarkable.  Adrenals/Urinary Tract: Bilateral adrenal glands are normal in appearance. Exophytic 2.5 cm low-attenuation lesion extending off the lower pole of the right kidney is compatible with a small simple cyst. Multifocal cortical thinning in the left kidney, compatible with multifocal post infarct or post infectious scarring. No hydroureteronephrosis. Urinary bladder is normal in appearance.  Stomach/Bowel: Postoperative changes of Whipple procedure. No pathologic dilatation of small bowel or colon. Normal appendix.  Vascular/Lymphatic: Aortic atherosclerosis, without evidence of aneurysm or dissection in the abdominal or pelvic vasculature. No lymphadenopathy noted in the abdomen or pelvis.  Reproductive: Status post hysterectomy. Ovaries are not confidently identified  may be surgically absent or atrophic.  Other: No significant volume of ascites.  No pneumoperitoneum.  Musculoskeletal: There are no aggressive appearing lytic or blastic lesions noted in the visualized portions of the skeleton.  VASCULAR MEASUREMENTS PERTINENT TO TAVR:  AORTA:  Minimal Aortic Diameter-16 x 15 mm  Severity of Aortic Calcification-severe  RIGHT PELVIS:  Right Common Iliac Artery -  Minimal Diameter-8.0 x 10.2 mm  Tortuosity-mild  Calcification-moderate  Right External Iliac Artery -  Minimal Diameter-8.5 x 7.3 mm  Tortuosity-mild-to-moderate  Calcification-none  Right Common Femoral Artery -  Minimal Diameter-7.9 x 7.8 mm  Tortuosity-mild  Calcification-mild  LEFT PELVIS:  Left Common Iliac Artery -  Minimal Diameter-8.6 x  9.2 mm  Tortuosity-mild  Calcification-moderate  Left External Iliac Artery -  Minimal Diameter-8.3 x 7.7 mm  Tortuosity-mild  Calcification-minimal  Left Common Femoral Artery -  Minimal Diameter-8.2 x 8.0 mm  Tortuosity-mild  Calcification-mild  Review of the MIP images confirms the above findings.  IMPRESSION: 1. Vascular findings and measurements pertinent to potential TAVR, as above. 2. Severe thickening calcification of the aortic valve, compatible with the reported clinical history of severe aortic stenosis. 3. Cardiomegaly with biatrial dilatation. Notably, there is a 3 x 1.4 cm filling defect in the tip of the left atrial appendage concerning for potential left atrial appendage thrombus. Correlation with transesophageal echocardiography is suggested if not recently performed. 4. The appearance of the lungs suggests mild interstitial pulmonary edema, likely from congestive heart failure. 5. Morphologic changes in the liver suggestive of early cirrhosis. 6. Additional incidental findings, as above.   Electronically Signed   By: Vinnie Langton M.D.    On: 04/07/2017 16:34   STS Risk Calculator  Procedure: AVR + CAB CALCULATE  Risk of Mortality:  7.184%   Renal Failure:  7.184%   Permanent Stroke:  2.239%   Prolonged Ventilation:  18.574%   DSW Infection:  0.202%   Reoperation:  5.767%   Morbidity or Mortality:  22.573%   Short Length of Stay:  8.046%   Long Length of Stay:  19.646%       Impression:  Patient has stage D severe symptomatic aortic stenosis.  She presents with stable but progressive symptoms of exertional shortness of breath and chest tightness consistent with angina and chronic diastolic congestive heart failure, New York Heart Association functional class II.  I have personally reviewed the patient's recent transthoracic echocardiogram, diagnostic cardiac catheterization, and CT angiograms.  Echocardiogram confirms the presence of severe aortic stenosis.  The aortic valve is trileaflet with severe thickening, calcification, and restricted leaflet mobility involving all 3 leaflets of the aortic valve.  Peak velocity across the aortic valve was affected by irregular RR interval due to the patient's atrial fibrillation but ranged between 4.1 and 5.0 m/s corresponding to mean transvalvular gradient estimated at least 49 mmHg.  Left ventricular systolic function remains normal.  Diagnostic cardiac catheterization reveals multivessel coronary artery disease with mild nonobstructive disease in the left anterior descending coronary artery and left circumflex territories, high-grade stenosis of a diagonal branch, and moderate stenosis of the distal right coronary artery.  Risks associated with conventional surgical aortic valve replacement with or without coronary artery bypass grafting would be relatively high in this elderly patient.  Cardiac-gated CTA of the heart reveals anatomical characteristics consistent with aortic stenosis suitable for treatment by transcatheter aortic valve replacement without any significant complicating  features and CTA of the aorta and iliac vessels demonstrate what appears to be adequate pelvic vascular access to facilitate a transfemoral approach.   Cardiac gated CT of the heart did reveal what might be a small clot in the tip of the left atrial appendage.  Under the circumstances I agree that it would be very reasonable to treat the patient's aortic stenosis with transcatheter aortic valve replacement and continue to treat the patient's coronary artery disease medically.   Plan:  The patient and her daughter were counseled at length regarding treatment alternatives for management of severe symptomatic aortic stenosis and moderate coronary artery disease. Alternative approaches such as conventional aortic valve replacement with or without coronary artery bypass grafting, transcatheter aortic valve replacement, and palliative medical therapy were compared and contrasted at  length.  The risks associated with conventional surgical aortic valve replacement were been discussed in detail, as were expectations for post-operative convalescence, and why I would be reluctant to consider this patient a candidate for conventional surgery.  Issues specific to transcatheter aortic valve replacement were discussed including questions about long term valve durability, the potential for paravalvular leak, possible increased risk of need for permanent pacemaker placement, and other technical complications related to the procedure itself.  Long-term prognosis with medical therapy was discussed. This discussion was placed in the context of the patient's own specific clinical presentation and past medical history.  All of their questions been addressed.  The patient hopes to proceed with transcatheter aortic valve replacement in the near future, this has tentatively been scheduled for May 12, 2017.  Following the decision to proceed with transcatheter aortic valve replacement, a discussion has been held regarding what  types of management strategies would be attempted intraoperatively in the event of life-threatening complications, including whether or not the patient would be considered a candidate for the use of cardiopulmonary bypass and/or conversion to open sternotomy for attempted surgical intervention.  The patient has been advised of a variety of complications that might develop including but not limited to risks of death, stroke, paravalvular leak, aortic dissection or other major vascular complications, aortic annulus rupture, device embolization, cardiac rupture or perforation, mitral regurgitation, acute myocardial infarction, arrhythmia, heart block or bradycardia requiring permanent pacemaker placement, congestive heart failure, respiratory failure, renal failure, pneumonia, infection, other late complications related to structural valve deterioration or migration, or other complications that might ultimately cause a temporary or permanent loss of functional independence or other long term morbidity.  The patient provides full informed consent for the procedure as described and all questions were answered.   I spent in excess of 90 minutes during the conduct of this office consultation and >50% of this time involved direct face-to-face encounter with the patient for counseling and/or coordination of their care.    Valentina Gu. Roxy Manns, MD 04/17/2017 2:08 PM

## 2017-04-22 ENCOUNTER — Other Ambulatory Visit: Payer: Self-pay

## 2017-04-22 DIAGNOSIS — I350 Nonrheumatic aortic (valve) stenosis: Secondary | ICD-10-CM

## 2017-04-27 ENCOUNTER — Ambulatory Visit: Payer: Medicare Other | Admitting: Cardiovascular Disease

## 2017-04-29 ENCOUNTER — Institutional Professional Consult (permissible substitution) (INDEPENDENT_AMBULATORY_CARE_PROVIDER_SITE_OTHER): Payer: Medicare Other | Admitting: Surgery

## 2017-04-29 ENCOUNTER — Encounter: Payer: Self-pay | Admitting: Surgery

## 2017-04-29 ENCOUNTER — Ambulatory Visit (INDEPENDENT_AMBULATORY_CARE_PROVIDER_SITE_OTHER): Payer: Medicare Other | Admitting: Pharmacist

## 2017-04-29 VITALS — BP 149/75 | HR 71 | Resp 20 | Ht 72.0 in | Wt 210.0 lb

## 2017-04-29 DIAGNOSIS — I481 Persistent atrial fibrillation: Secondary | ICD-10-CM

## 2017-04-29 DIAGNOSIS — I350 Nonrheumatic aortic (valve) stenosis: Secondary | ICD-10-CM

## 2017-04-29 DIAGNOSIS — I25119 Atherosclerotic heart disease of native coronary artery with unspecified angina pectoris: Secondary | ICD-10-CM | POA: Diagnosis not present

## 2017-04-29 DIAGNOSIS — I4819 Other persistent atrial fibrillation: Secondary | ICD-10-CM

## 2017-04-29 DIAGNOSIS — I482 Chronic atrial fibrillation, unspecified: Secondary | ICD-10-CM

## 2017-04-29 DIAGNOSIS — I209 Angina pectoris, unspecified: Secondary | ICD-10-CM | POA: Diagnosis not present

## 2017-04-29 DIAGNOSIS — Z5181 Encounter for therapeutic drug level monitoring: Secondary | ICD-10-CM | POA: Diagnosis not present

## 2017-04-29 MED ORDER — ENOXAPARIN SODIUM 80 MG/0.8ML ~~LOC~~ SOLN: 80.0000 mg | Freq: Two times a day (BID) | SUBCUTANEOUS | 0 refills | Status: DC

## 2017-04-29 NOTE — Progress Notes (Signed)
Patient presents today for discussion on lovenox bridge for TAVR on 05/12/16. Her weight is 90kgs, Crcl 39ml/min. Per Dr. Roxy Manns last dose of Eliquis on 05/07/17. Patient's weight falls between dosing sizes of Lovenox syringes. Will dose 80mg  Q12H due to kidney function. Start lovenox on 05/08/17 with last dose morning before procedure. She will resume Eliquis at the direction of surgeon after procedure.

## 2017-04-29 NOTE — Patient Instructions (Signed)
05/07/17: Last dose of Eliquis in the evening  05/08/17: Inject Lovenox 80mg  in the fatty abdominal tissue at least 2 inches from the belly button twice a day about 12 hours apart, 6am and 6pm rotate sites. No Eliquis  05/09/17: Inject Lovenox in the fatty tissue every 12 hours, 6am and 6pm. No Eliquis  05/10/17: Inject Lovenox in the fatty tissue every 12 hours, 6am and 6pm. No Eliquis  05/11/17: Inject Lovenox in the fatty tissue in the morning at 6 am (No PM dose). No Eliquis  05/12/17: Procedure Day - No Lovenox - Resume Eliquis when directed by Dr. Roxy Manns

## 2017-05-04 ENCOUNTER — Encounter: Payer: Self-pay | Admitting: Surgery

## 2017-05-04 NOTE — Progress Notes (Signed)
[facility]  CARDIOTHORACIC SURGERY CONSULTATION REPORT  Referring Provider is Gollan, Kathlene November, MD PCP is Vasireddy, Lanetta Inch, MD  Chief Complaint  Patient presents with  . Aortic Stenosis    2nd TAVR eval, review all studies    HPI:  The patient is an 84 year old woman with a history of hypertension, hyperlipidemia, long-standing persistent atrial fibrillation on anticoagulation, chronic diastolic congestive heart failure, tachy-brady syndrome status post permanent pacemaker placement, chronic kidney disease, history of neuroendocrine carcinoma status post Whipple procedure in 2014, aortic stenosis, and a recent hospitalization for torsades with ventricular tachycardia.  Her cardiac history dates back to 2014 when she underwent a Whipple procedure at [facility].  She had a prolonged hospitalization and soon after discharge suffered an MI.  In 2017 she developed dizzy spells and a Holter monitor showed tachy-brady syndrome.  She underwent insertion of a permanent pacemaker in December 2017 with subsequent revision due to lead dislodgment.  She did well until she was recently admitted to [facility] with shortness of breath, diaphoresis, and chest discomfort.  She was found to be in ventricular tachycardia with torsades with frequent PVCs and runs of nonsustained ventricular tachycardia.  She was noted to have a very low potassium level which was replaced.  She underwent cardiac catheterization which showed multivessel coronary disease with 40% stenosis of the LAD, high-grade stenosis of a diagonal branch, and 75% stenosis of the distal right coronary artery.  The aortic valve could not be crossed.  An echocardiogram at that time showed severe aortic stenosis with a mean transvalvular gradient of 49 mmHg.  Left ventricular systolic function was normal.  There was mild to moderate mitral regurgitation and moderate tricuspid regurgitation with moderate  pulmonary hypertension.  Her coronary disease was treated medically and she was seen by Dr. Burt Knack for consideration of TAVR to treat her severe aortic stenosis.  She is here today with her daughter.  She reports a progressive decline in her exertional stamina with development of shortness of breath with any significant activity.  She can walk around her house and do light activity without shortness of breath but anything more involved because of shortness of breath and fatigue.  She has had no symptoms at rest and denies orthopnea and PND.  She has had some lower extremity swelling which improved with diuretics.  She had dizzy spells prior to her admission with ventricular arrhythmias but not has not had any dizzy spells since.  She recently started using a cane for balance.  Past Medical History:  Diagnosis Date  . Anxiety   . Bradycardia   . Carotid artery occlusion   . Chronic diastolic CHF (congestive heart failure) ([place])   . Coronary artery disease involving native coronary artery of native heart   . GERD (gastroesophageal reflux disease)   . HLD (hyperlipidemia)   . Hypertension   . Obesity   . Osteoarthritis   . Permanent atrial fibrillation ([place])   . Presence of permanent cardiac pacemaker   . PVD (peripheral vascular disease) ([place])   . Severe aortic stenosis   . Stomach cancer ([place]) ~ 2013  . Vertigo     Past Surgical History:  Procedure Laterality Date  . CARDIOVASCULAR STRESS TEST    . CARDIOVERSION N/A 08/08/2016   Procedure: CARDIOVERSION;  Surgeon: Sueanne Margarita, MD;  Location: [facility] ENDOSCOPY;  Service: Cardiovascular;  Laterality: N/A;  . CAROTID ENDARTERECTOMY Right ~ 2005  . CATARACT EXTRACTION W/ INTRAOCULAR LENS IMPLANT  Right   . CHOLECYSTECTOMY  ~ 2013   "part of her Whipple OR"  . EP IMPLANTABLE DEVICE N/A 03/04/2016   Procedure: Pacemaker Implant;  Surgeon: Will Meredith Leeds, MD;  Location: [place] [facility];  Service: Cardiovascular;  Laterality: N/A;  . EP  IMPLANTABLE DEVICE N/A 03/05/2016   Procedure: Lead Revision/Repair;  Surgeon: Deboraha Sprang, MD;  Location: [place] [facility];  Service: Cardiovascular;  Laterality: N/A;  . INSERT / REPLACE / REMOVE PACEMAKER    . LEFT HEART CATH AND CORONARY ANGIOGRAPHY N/A 03/27/2017   Procedure: LEFT HEART CATH AND CORONARY ANGIOGRAPHY;  Surgeon: Minna Merritts, MD;  Location: [place] [facility];  Service: Cardiovascular;  Laterality: N/A;  . US ECHOCARDIOGRAPHY    . VAGINAL HYSTERECTOMY    . WHIPPLE PROCEDURE  ~ 2013    Family History  Problem Relation Age of Onset  . Heart disease Sister     Social History   Socioeconomic History  . Marital status: Widowed    Spouse name: Not on file  . Number of children: Not on file  . Years of education: Not on file  . Highest education level: Not on file  Social Needs  . Financial resource strain: Not on file  . Food insecurity - worry: Not on file  . Food insecurity - inability: Not on file  . Transportation needs - medical: Not on file  . Transportation needs - non-medical: Not on file  Occupational History  . Not on file  Tobacco Use  . Smoking status: Never Smoker  . Smokeless tobacco: Never Used  Substance and Sexual Activity  . Alcohol use: No  . Drug use: No  . Sexual activity: No  Other Topics Concern  . Not on file  Social History Narrative  . Not on file    Current Outpatient Medications  Medication Sig Dispense Refill  . acetaminophen (TYLENOL) 500 MG tablet Take 1,000 mg by mouth every 6 (six) hours as needed for moderate pain.    Marland Kitchen apixaban (ELIQUIS) 5 MG TABS tablet Take 5 mg by mouth 2 (two) times daily.    Marland Kitchen diltiazem (CARDIZEM CD) 180 MG 24 hr capsule Take 1 capsule (180 mg total) by mouth daily. 90 capsule 3  . enoxaparin (LOVENOX) 80 MG/0.8ML injection Inject 0.8 mLs (80 mg total) into the skin every 12 (twelve) hours. 7 Syringe 0  . escitalopram (LEXAPRO) 10 MG tablet Take 10 mg by mouth at bedtime.     . ferrous  sulfate 325 (65 FE) MG tablet Take 325 mg by mouth daily with breakfast.    . gabapentin (NEURONTIN) 600 MG tablet Take 600 mg by mouth 2 (two) times daily.     Marland Kitchen levothyroxine (SYNTHROID) 50 MCG tablet Take 1 tablet (50 mcg total) by mouth daily. 30 tablet 0  . omeprazole (PRILOSEC) 20 MG capsule Take 20 mg by mouth daily.    . potassium chloride (K-DUR) 10 MEQ tablet Take 1 tablet (10 mEq total) by mouth 2 (two) times daily. 60 tablet 0  . pravastatin (PRAVACHOL) 40 MG tablet Take 40 mg by mouth every evening.     . torsemide (DEMADEX) 20 MG tablet Take 1 tablet (20 mg total) by mouth daily. Take one additional tablet daily as needed for swelling. 45 tablet 11  . Vitamin D, Ergocalciferol, (DRISDOL) 50000 units CAPS capsule Take 50,000 Units by mouth 2 (two) times a week. Take on Sunday and Thursday     No current  facility-administered medications for this visit.     Allergies  Allergen Reactions  . Penicillins Hives and Other (See Comments)    Has patient had a PCN reaction causing immediate rash, facial/tongue/throat swelling, SOB or lightheadedness with hypotension: Yes Has patient had a PCN reaction causing severe rash involving mucus membranes or skin necrosis: No Has patient had a PCN reaction that required hospitalization: No Has patient had a PCN reaction occurring within the last 10 years: No If all of the above answers are "NO", then may proceed with Cephalosporin use.   . Sulfa Antibiotics Hives      Review of Systems:   General:  normal appetite, decreased energy, no weight gain, no weight loss, no fever  Cardiac:  mild chest pain with exertion, no chest pain at rest, has SOB with moderate exertion, no resting SOB, no PND, no orthopnea, no palpitations, has arrhythmia, has atrial fibrillation, some LE edema, has had dizzy spells, no syncope  Respiratory:  exertional shortness of breath, no home oxygen, no productive cough, no dry cough, no bronchitis, no wheezing, no  hemoptysis, no asthma, no pain with inspiration or cough, no sleep apnea, no CPAP at night  GI:   no difficulty swallowing, no reflux, no frequent heartburn,  no hiatal hernia, no abdominal pain, no constipation, no diarrhea, no hematochezia, no hematemesis, no melena  GU:   no dysuria,  has frequency, no urinary tract infection, no hematuria,  no kidney stones, chronic kidney disease  Vascular:  no pain suggestive of claudication, has pain in feet, no leg cramps, has varicose veins, no DVT, no non-healing foot ulcer  Neuro:   no stroke, no TIA's, no seizures, no headaches, no temporary blindness one eye,  no slurred speech, has peripheral neuropathy, has chronic pain, has instability of gait, no memory/cognitive dysfunction  Musculoskeletal: has arthritis, no joint swelling, no myalgias, some difficulty walking, mild limitation of mobility and uses a cane for balance  Skin:   no rash, no itching, no skin infections, no pressure sores or ulcerations  Psych:   no anxiety, no depression, no nervousness, no unusual recent stress  Eyes:   no blurry vision, no floaters, no recent vision changes,  wears glasses  ENT:   no hearing loss, no loose or painful teeth, has dentures, last saw dentist 01/2016  Hematologic:  has easy bruising, no abnormal bleeding, no clotting disorder, no frequent epistaxis  Endocrine:  no diabetes, does not check CBG's at home       Physical Exam:   BP (!) 149/75   Pulse 71   Resp 20   Ht 6' (1.829 m)   Wt 95.3 kg (210 lb)   SpO2 97% Comment: RA  BMI 28.48 kg/m   General:  Elderly but well-appearing  HEENT:  Unremarkable, NCAT, PERLA, EOMI, oropharynx clear  Neck:   no JVD, no bruits, no adenopathy or thyromegaly  Chest:   clear to auscultation, symmetrical breath sounds, no wheezes, no rhonchi   CV:   RRR, grade III/VI crescendo/decrescendo murmur heard best at RSB,  no diastolic murmur  Abdomen:  soft, non-tender, no masses or organomegaly  Extremities:  warm,  well-perfused, pulses palpable, no LE edema  Rectal/GU  Deferred  Neuro:   Grossly non-focal and symmetrical throughout  Skin:   Clean and dry, no rashes, no breakdown   Diagnostic Tests:   *[facility]*  [place], [place] [id number]                            [phone]  ------------------------------------------------------------------- Transthoracic Echocardiography  Patient:    Theresa Cain, Theresa Cain MR #:       [id number] Study Date: 03/24/2017 Gender:     F Age:        58 Height:     182.9 cm Weight:     93.1 kg BSA:        2.19 m^2 Pt. Status: Room:       238A   ADMITTING    Rhett Bannister  REFERRING    Ethlyn Daniels  SONOGRAPHER  Cindy Hazy, RDCS  ATTENDING    Manuella Ghazi, Vipul S  PERFORMING   [facility] [employer]  cc:  ------------------------------------------------------------------- LV EF: 60% -   65%  ------------------------------------------------------------------- Indications:      R94.31 Abnormal EKG.  ------------------------------------------------------------------- History:   PMH:  Acquired from the patient and from the patient&'s chart.  PMH:  Atrial Fibrillation. Anxiety. Bilateral lower extremity edema. Bradycardia. Murmur. Congestive heart failure. Peripheral vascular disease. Shortness of breath.  Risk factors: Hypertension. Dyslipidemia.  ------------------------------------------------------------------- Study Conclusions  - Left ventricle: The cavity size was normal. There was mild   concentric hypertrophy. Systolic function was normal. The   estimated ejection fraction was in the range of 60% to 65%. Wall   motion was normal; there were no regional wall motion   abnormalities. The study is not technically sufficient to allow   evaluation of LV diastolic function. - Aortic valve: There was severe stenosis. Peak velocity (S):  448   cm/s. Mean gradient (S): 49 mm Hg. - Mitral valve: There was mild to moderate regurgitation. - Left atrium: The atrium was moderately dilated. - Right ventricle: Systolic function was normal. - Tricuspid valve: There was moderate regurgitation. - Pulmonary arteries: Systolic pressure was moderate to severely   elevated. PA peak pressure: 60 mm Hg (S).  Impressions:  - Paced rhythm with underlying atrial fibrillation.  ------------------------------------------------------------------- Study data:   Study status:  Routine.  Procedure:  The patient reported no pain pre or post test. Transthoracic echocardiography for left ventricular function evaluation, for right ventricular function evaluation, and for assessment of valvular function. Image quality was adequate.  Study completion:  There were no complications.          Transthoracic echocardiography.  M-mode, complete 2D, spectral Doppler, and color Doppler.  Birthdate: Patient birthdate: 02-May-1926.  Age:  Patient is 84 yr old.  Sex: Gender: female.    BMI: 27.8 kg/m^2.  Blood pressure:     158/59 Patient status:  Inpatient.  Study date:  Study date: 03/24/2017. Study time: 11:46 AM.  Location:  Echo laboratory.  -------------------------------------------------------------------  ------------------------------------------------------------------- Left ventricle:  The cavity size was normal. There was mild concentric hypertrophy. Systolic function was normal. The estimated ejection fraction was in the range of 60% to 65%. Wall motion was normal; there were no regional wall motion abnormalities. The study is not technically sufficient to allow evaluation of LV diastolic function.  ------------------------------------------------------------------- Aortic valve:   Trileaflet; normal thickness, severely calcified leaflets. Mobility was not restricted.  Doppler:   There was severe stenosis.   There was no  regurgitation.  VTI ratio of LVOT to aortic valve: 0.3. Valve area (VTI): 1.03 cm^2. Indexed valve area (VTI): 0.47 cm^2/m^2. Peak velocity ratio of LVOT to aortic valve: 0.26. Indexed valve area (Vmax): 0.42 cm^2/m^2. Mean velocity ratio of LVOT to aortic valve: 0.29. Valve area (Vmean): 0.99 cm^2. Indexed valve area (Vmean): 0.45 cm^2/m^2.    Mean gradient (S): 49 mm Hg. Peak gradient (S): 80 mm Hg.  ------------------------------------------------------------------- Aorta:  Aortic root: The aortic root was normal in size.  ------------------------------------------------------------------- Mitral valve:   Structurally normal valve.   Mobility was not restricted.  Doppler:  Transvalvular velocity was within the normal range. There was no evidence for stenosis. There was mild to moderate regurgitation.    Peak gradient (D): 13 mm Hg.  ------------------------------------------------------------------- Left atrium:  The atrium was moderately dilated.  ------------------------------------------------------------------- Right ventricle:  The cavity size was normal. Wall thickness was normal. Systolic function was normal.  ------------------------------------------------------------------- Pulmonic valve:    Structurally normal valve.   Cusp separation was normal.  Doppler:  Transvalvular velocity was within the normal range. There was no evidence for stenosis. There was no regurgitation.  ------------------------------------------------------------------- Tricuspid valve:   Structurally normal valve.    Doppler: Transvalvular velocity was within the normal range. There was moderate regurgitation.  ------------------------------------------------------------------- Pulmonary artery:   The main pulmonary artery was normal-sized. Systolic pressure was moderate to severely elevated.  ------------------------------------------------------------------- Right atrium:  The  atrium was normal in size.  ------------------------------------------------------------------- Pericardium:  There was no pericardial effusion.  ------------------------------------------------------------------- Systemic veins: Inferior vena cava: The vessel was normal in size.  ------------------------------------------------------------------- Measurements   Left ventricle                            Value          Reference  LV ID, ED, PLAX chordal                   46.5  mm       43 - 52  LV ID, ES, PLAX chordal                   27.7  mm       23 - 38  LV fx shortening, PLAX chordal            40    %        >=29  LV PW thickness, ED                       14.4  mm       ---------  IVS/LV PW ratio, ED                       1.02           <=1.3  Stroke volume, 2D                         115   ml       ---------  Stroke volume/bsa, 2D                     52    ml/m^2   ---------  LV e&', lateral                            7.62  cm/s     ---------  LV E/e&', lateral                          23.62          ---------  LV e&', medial                             8.27  cm/s     ---------  LV E/e&', medial                           21.77          ---------  LV e&', average                            7.95  cm/s     ---------  LV E/e&', average                          22.66          ---------    Ventricular septum                        Value          Reference  IVS thickness, ED                         14.7  mm       ---------    LVOT                                      Value          Reference  LVOT ID, S                                21    mm       ---------  LVOT area                                 3.46  cm^2     ---------  LVOT ID                                   21    mm       ---------  LVOT peak velocity, S                     116   cm/s     ---------  LVOT mean velocity, S                     93    cm/s     ---------  LVOT VTI, S                               33.3  cm        ---------  LVOT peak gradient, S  5     mm Hg    ---------  Stroke volume (SV), LVOT DP               115.3 ml       ---------  Stroke index (SV/bsa), LVOT DP            52.6  ml/m^2   ---------    Aortic valve                              Value          Reference  Aortic valve peak velocity, S             448   cm/s     ---------  Aortic valve mean velocity, S             325   cm/s     ---------  Aortic valve VTI, S                       112   cm       ---------  Aortic mean gradient, S                   49    mm Hg    ---------  Aortic peak gradient, S                   80    mm Hg    ---------  VTI ratio, LVOT/AV                        0.3            ---------  Aortic valve area, VTI                    1.03  cm^2     ---------  Aortic valve area/bsa, VTI                0.47  cm^2/m^2 ---------  Velocity ratio, peak, LVOT/AV             0.26           ---------  Aortic valve area/bsa, peak               0.42  cm^2/m^2 ---------  velocity  Velocity ratio, mean, LVOT/AV             0.29           ---------  Aortic valve area, mean velocity          0.99  cm^2     ---------  Aortic valve area/bsa, mean               0.45  cm^2/m^2 ---------  velocity  Aortic regurg pressure half-time          489   ms       ---------    Aorta                                     Value          Reference  Aortic root ID, ED                        34  mm       ---------    Left atrium                               Value          Reference  LA ID, A-P, ES                            65    mm       ---------  LA ID/bsa, A-P                    (H)     2.97  cm/m^2   <=2.2  LA volume, S                              154   ml       ---------  LA volume/bsa, S                          70.3  ml/m^2   ---------  LA volume, ES, 1-p A4C                    137   ml       ---------  LA volume/bsa, ES, 1-p A4C                62.5  ml/m^2   ---------  LA volume, ES, 1-p A2C                     157   ml       ---------  LA volume/bsa, ES, 1-p A2C                71.7  ml/m^2   ---------    Mitral valve                              Value          Reference  Mitral E-wave peak velocity               180   cm/s     ---------  Mitral deceleration time                  176   ms       150 - 230  Mitral peak gradient, D                   13    mm Hg    ---------    Pulmonary arteries                        Value          Reference  PA pressure, S, DP                (H)     60    mm Hg    <=30    Right ventricle                           Value          Reference  TAPSE  17.3  mm       ---------  Legend: (L)  and  (H)  mark values outside specified reference range.  ------------------------------------------------------------------- Prepared and Electronically Authenticated by  Esmond Plants, MD, [facility] 2019-01-01T13:35:39   Physicians   Panel Physicians Referring Physician Case Authorizing Physician  Minna Merritts, MD (Primary)  Rise Mu, PA-C  Procedures   LEFT HEART CATH AND CORONARY ANGIOGRAPHY  Conclusion    Prox LAD lesion is 40% stenosed.  Prox LAD to Mid LAD lesion is 40% stenosed.  Dist RCA lesion is 75% stenosed.  Post Atrio lesion is 40% stenosed.    Procedural Details/Technique   Technical Details Cardiac Catheterization Procedure Note  Name: Ozzie Remmers MRN: [id number] DOB: 1926-05-09  Procedure: Left Heart Cath, Selective Coronary Angiography, LV angiography  Indication:  84 y.o. female with history of permanent Afib on Eliquis, tachybrady syndrome s/p MDT PPM, chronic diastolic CHF, severe aortic valve stenosis, HTN, HLD, and PVD who presented to [facility] with weakness/flushing. VT/torsades seen on monitor, potassium of 3.0. Cradiac cath for VT, torsades, severe aortic valve stenosis, preop/candidate for TAVR   Procedural details: The right groin was prepped, draped, and anesthetized with 1% lidocaine. Using  modified Seldinger technique, a 5 French sheath was introduced into the right femoral artery. Standard Judkins catheters (JL 5, JR 5 and pigtail catheter) were used for coronary angiography and left ventriculography. Catheter exchanges were performed over a guidewire. There were no immediate procedural complications. The patient was transferred to the post catheterization recovery area for further monitoring.  Moderate sedation: I was Face to Face with the patient during this time: (code: [phone])   Procedural Findings:  Coronary dominance: Right  Left mainstem: Large vessel that bifurcates into the LAD and left circumflex, no significant disease noted  Left anterior descending (LAD): Large vessel that extends to the apical region, diagonal branch 1 of moderate size, proximal to mid sequential lesions estimated at 40 to 50%.  Left circumflex (LCx): Large vessel with OM branch 2, no significant disease noted. Mild lumenal irregularities, calcified  Right coronary artery (RCA): Right dominant vessel with PL and PDA, moderate to severe distal RCA disease prior to bifurcation. Mild calcified proximal disease. PDA disease, moderate/focal  Left ventriculography: unable to cross aortic valve for pressures  Final Conclusions:  Moderate to severe distal RCA disease, Mild to Moderate mid LAD disease Medical management at this time  Recommendations:  Asa and statin, b-blocker We will refer for evaluation of TAVR given severe aortic valve stenosis on echo  Timothy Gollan 03/27/2017, 9:20 AM   Estimated blood loss <50 mL.  During this procedure the patient was administered the following to achieve and maintain moderate conscious sedation: Versed 1 mg, Fentanyl 25 mcg, while the patient's heart rate, blood pressure, and oxygen saturation were continuously monitored. The period of conscious sedation was 30 minutes, of which I was present face-to-face 100% of this time.  Coronary Findings    Diagnostic  Dominance: Right  Left Anterior Descending  Prox LAD lesion 40% stenosed  Prox LAD lesion is 40% stenosed. The lesion is calcified.  Prox LAD to Mid LAD lesion 40% stenosed  Prox LAD to Mid LAD lesion is 40% stenosed.  Right Coronary Artery  Dist RCA lesion 75% stenosed  Dist RCA lesion is 75% stenosed.  Right Posterior Atrioventricular Branch  Post Atrio lesion 40% stenosed  Post Atrio lesion is 40% stenosed.  Intervention   No interventions have been documented.  Coronary Diagrams   Diagnostic  Diagram       Implants     Vascular Products  Device Closure Mynxgrip 70f [id number] - Implanted    Inventory item: Device Closure Mynxgrip 75f Model/Cat number: [id number]  Manufacturer: [employer] Lot number: [id number]  Device identifier: [id number] Device identifier type: GS1  Area Of Implantation: Groin    GUDID Information   Request status Successful    Brand name: [facility] Version/Model: [id number]  Company name: [employer]. MRI safety info as of 03/27/17: Labeling does not contain MRI Safety Information  Contains dry or latex rubber: No    GMDN P.T. name: Wound hydrogel dressing, sterile    As of 03/27/2017   Status: Implanted      MERGE Images   Show images for CARDIAC CATHETERIZATION   Link to Procedure Log   Procedure Log    Hemo Data 03/27/17 0830--03/27/17 0923 before discharge   AO Systolic Cath Pressure AO Diastolic Cath Pressure AO Mean Cath Pressure  142 59 mmHg 92 mmHg  146 52 mmHg 88 mmHg  142 57 mmHg 88 mmHg   ADDENDUM REPORT: 04/07/2017 20:08  CLINICAL DATA:  84 year old female with severe aortic stenosis being evaluated for a TAVR procedure.  EXAM: Cardiac TAVR CT  TECHNIQUE: The patient was scanned on a [employer]. A 120 kV retrospective scan was triggered in the descending thoracic aorta at 111 HU's. Gantry rotation speed was 250 msecs and collimation was .6 mm. No beta blockade or nitro were given.  The 3D data set was reconstructed in 5% intervals of the R-R cycle. Systolic and diastolic phases were analyzed on a dedicated work station using MPR, MIP and VRT modes. The patient received 80 cc of contrast.  FINDINGS: Aortic Valve: Trileaflet aortic valve with severely thickened and calcified valves and minimal calcifications extending into the LVOT.  Aorta: Mildly aneurysmal ascending aorta, mild diffuse calcifications, no dissection.  Sinotubular Junction: 28 x 28 mm  Ascending Thoracic Aorta: Maximum diameter 40 x 39 mm  Aortic Arch: 28 x 25 mm  Descending Thoracic Aorta: 24 x 23 mm  Sinus of Valsalva Measurements:  Non-coronary: 31 mm  Right -coronary: 30 mm  Left -coronary: 31 mm  Sinus of Valsalva Height:  Right -coronary: 21 mm  Left -coronary: 22 mm  Coronary Artery Height above Annulus:  Left Main: 16 mm  Right Coronary: 17 mm  Virtual Basal Annulus Measurements:  Maximum/Minimum Diameter: 24.6 x 22.3 mm  Mean Diameter: 23.2 mm  Perimeter: 74 mm  Area: 422 mm2  Optimum Fluoroscopic Angle for Delivery: LAO 8 CAU 6  IMPRESSION: 1. Trileaflet aortic valve with severely thickened and calcified valves and minimal calcifications extending into the LVOT. Annular measurements suitable for a 26 mm Edwards-SAPIEN 3 valve or a 29 mm Evolut R Medtronic valve.  2. Sufficient coronary to annulus distance.  3. Optimum Fluoroscopic Angle for Delivery: LAO 8 CAU 6.  4. Left atrial appendage is large, multilobular with a filling defect at the tip of the largest lobe measuring 30 x 17 mm. This most probably represents a thrombus (the defect is seen on early and delayed contrast images).  5. Mildly dilated pulmonary artery measuring 33 mm suggestive of pulmonary hypertension.   Electronically Signed   By: Ena Dawley   On: 04/07/2017 20:08   Addended by Dorothy Spark, MD on 04/07/2017 8:10 PM    Study Result    EXAM: OVER-READ INTERPRETATION  CT CHEST  The following report is an over-read performed by  radiologist Dr. Vinnie Langton of [facility], [place] on 04/07/2017. This over-read does not include interpretation of cardiac or coronary anatomy or pathology. The coronary calcium score/coronary CTA interpretation by the cardiologist is attached.  COMPARISON:  None.  FINDINGS: Extracardiac findings will be described under separate dictation for contemporaneously obtained CTA of the chest, abdomen and pelvis.  IMPRESSION: Please see separate dictation for contemporaneously obtained CTA of the chest, abdomen and pelvis dated 04/07/2017 for full description of relevant extracardiac findings.  Electronically Signed: By: Vinnie Langton M.D. On: 04/07/2017 14:59      CLINICAL DATA:  84 year old female with history of severe aortic stenosis. Preprocedural study prior to potential transcatheter aortic valve replacement (TAVR) procedure.  EXAM: CT ANGIOGRAPHY CHEST, ABDOMEN AND PELVIS  TECHNIQUE: Multidetector CT imaging through the chest, abdomen and pelvis was performed using the standard protocol during bolus administration of intravenous contrast. Multiplanar reconstructed images and MIPs were obtained and reviewed to evaluate the vascular anatomy.  CONTRAST:  60 mL of Isovue 370.  COMPARISON:  None.  FINDINGS: CTA CHEST FINDINGS  Cardiovascular: Heart size is enlarged with biatrial dilatation. 3 x 1.4 cm filling defect in the tip of the left atrial appendage (axial image 52 of series 15) concerning for potential left atrial appendage thrombus. There is no significant pericardial fluid, thickening or pericardial calcification. There is aortic atherosclerosis, as well as atherosclerosis of the great vessels of the mediastinum and the coronary arteries, including calcified atherosclerotic plaque in the left main, left anterior descending, left circumflex  and right coronary arteries. Severe thickening calcification of the aortic valve. Mild calcification of the mitral annulus. Left sided pacemaker with lead tips terminating in the right atrial appendage and along the free wall of the right ventricle near the right ventricular apex.  Mediastinum/Lymph Nodes: No pathologically enlarged mediastinal or hilar lymph nodes. Esophagus is unremarkable in appearance. No axillary lymphadenopathy.  Lungs/Pleura: Diffuse areas of ground-glass attenuation and interlobular septal thickening, suggesting a background of mild interstitial pulmonary edema. No confluent consolidative airspace disease. No pleural effusions. No definite suspicious appearing pulmonary nodules or masses.  Musculoskeletal/Soft Tissues: There are no aggressive appearing lytic or blastic lesions noted in the visualized portions of the skeleton.  CTA ABDOMEN AND PELVIS FINDINGS  Hepatobiliary: Liver has a slightly shrunken appearance and nodular contour, indicative of early changes of cirrhosis. No discrete cystic or solid hepatic lesions. No intra or extrahepatic biliary ductal dilatation. Status post cholecystectomy.  Pancreas: Status post Whipple procedure. Remaining body and tail of the pancreas are normal in appearance. No pancreatic or peripancreatic fluid or inflammatory changes.  Spleen: Unremarkable.  Adrenals/Urinary Tract: Bilateral adrenal glands are normal in appearance. Exophytic 2.5 cm low-attenuation lesion extending off the lower pole of the right kidney is compatible with a small simple cyst. Multifocal cortical thinning in the left kidney, compatible with multifocal post infarct or post infectious scarring. No hydroureteronephrosis. Urinary bladder is normal in appearance.  Stomach/Bowel: Postoperative changes of Whipple procedure. No pathologic dilatation of small bowel or colon. Normal appendix.  Vascular/Lymphatic: Aortic  atherosclerosis, without evidence of aneurysm or dissection in the abdominal or pelvic vasculature. No lymphadenopathy noted in the abdomen or pelvis.  Reproductive: Status post hysterectomy. Ovaries are not confidently identified may be surgically absent or atrophic.  Other: No significant volume of ascites.  No pneumoperitoneum.  Musculoskeletal: There are no aggressive appearing lytic or blastic lesions noted in the visualized portions of the skeleton.  VASCULAR MEASUREMENTS PERTINENT TO TAVR:  AORTA:  Minimal Aortic  Diameter-16 x 15 mm  Severity of Aortic Calcification-severe  RIGHT PELVIS:  Right Common Iliac Artery -  Minimal Diameter-8.0 x 10.2 mm  Tortuosity-mild  Calcification-moderate  Right External Iliac Artery -  Minimal Diameter-8.5 x 7.3 mm  Tortuosity-mild-to-moderate  Calcification-none  Right Common Femoral Artery -  Minimal Diameter-7.9 x 7.8 mm  Tortuosity-mild  Calcification-mild  LEFT PELVIS:  Left Common Iliac Artery -  Minimal Diameter-8.6 x 9.2 mm  Tortuosity-mild  Calcification-moderate  Left External Iliac Artery -  Minimal Diameter-8.3 x 7.7 mm  Tortuosity-mild  Calcification-minimal  Left Common Femoral Artery -  Minimal Diameter-8.2 x 8.0 mm  Tortuosity-mild  Calcification-mild  Review of the MIP images confirms the above findings.  IMPRESSION: 1. Vascular findings and measurements pertinent to potential TAVR, as above. 2. Severe thickening calcification of the aortic valve, compatible with the reported clinical history of severe aortic stenosis. 3. Cardiomegaly with biatrial dilatation. Notably, there is a 3 x 1.4 cm filling defect in the tip of the left atrial appendage concerning for potential left atrial appendage thrombus. Correlation with transesophageal echocardiography is suggested if not recently performed. 4. The appearance of the lungs suggests mild  interstitial pulmonary edema, likely from congestive heart failure. 5. Morphologic changes in the liver suggestive of early cirrhosis. 6. Additional incidental findings, as above.   Electronically Signed   By: Vinnie Langton M.D.   On: 04/07/2017 16:34   STS Risk Calculator  Procedure: AVR + CAB CALCULATE  Risk of Mortality:  7.184%   Renal Failure:  7.184%   Permanent Stroke:  2.239%   Prolonged Ventilation:  18.574%   DSW Infection:  0.202%   Reoperation:  5.767%   Morbidity or Mortality:  22.573%   Short Length of Stay:  8.046%   Long Length of Stay:  19.646%      Impression:  This 84 year old woman has stage D, severe, symptomatic aortic stenosis with NYHA class II symptoms of progressive shortness of breath and fatigue with exertion consistent with chronic diastolic heart failure as well as some mild chest tightness with exertion suggesting angina.  I have personally reviewed her echocardiogram, cardiac catheterization, and CTA studies.  Her echocardiogram shows a trileaflet aortic valve with severe thickening, calcification, and restricted leaflet mobility with a mean gradient of 49 mmHg consistent with severe aortic stenosis.  Left ventricular ejection fraction is normal.  Cardiac catheterization showed multivessel coronary disease with mild nonobstructive disease in the LAD and left circumflex coronary arteries with a high-grade stenosis of a smaller diagonal branch and moderate distal right coronary stenosis.  I think aortic valve replacement is indicated in this patient and her coronary artery disease can be treated medically.  Her risk with open surgical aortic valve replacement with coronary bypass graft surgery would be at least moderately elevated due to her advanced age and other comorbid factors.  I think transcatheter aortic valve replacement would be a better option for her.  Her gated cardiac CTA shows anatomy suitable for transcatheter valve replacement using  a Sapien 3 valve without any significant complicating features.  There may be a small clot in the tip of the left atrial appendage.  Her abdominal and pelvic CTA shows suitable pelvic arterial anatomy to allow transfemoral insertion.  The patient and her daughter were counseled at length regarding treatment alternatives for management of severe symptomatic aortic stenosis. The risks and benefits of surgical intervention has been discussed in detail. Long-term prognosis with medical therapy was discussed. Alternative approaches such as conventional surgical  aortic valve replacement, transcatheter aortic valve replacement, and palliative medical therapy were compared and contrasted at length. This discussion was placed in the context of the patient's own specific clinical presentation and past medical history. All of their questions have been addressed.   Following the decision to proceed with transcatheter aortic valve replacement, a discussion was held regarding what types of management strategies would be attempted intraoperatively in the event of life-threatening complications, including whether or not the patient would be considered a candidate for the use of cardiopulmonary bypass and/or conversion to open sternotomy for attempted surgical intervention. The patient has been advised of a variety of complications that might develop including but not limited to risks of death, stroke, paravalvular leak, aortic dissection or other major vascular complications, aortic annulus rupture, device embolization, cardiac rupture or perforation, mitral regurgitation, acute myocardial infarction, arrhythmia, heart block or bradycardia requiring permanent pacemaker placement, congestive heart failure, respiratory failure, renal failure, pneumonia, infection, other late complications related to structural valve deterioration or migration, or other complications that might ultimately cause a temporary or permanent loss of  functional independence or other long term morbidity. The patient provides full informed consent for the procedure as described and all questions were answered.     Plan:  She will be tentatively scheduled for transfemoral transcatheter aortic valve replacement on 05/12/2017.   I spent 45 minutes performing this consultation and > 50% of this time was spent face to face counseling and coordinating the care of this patient's severe aortic stenosis.   Gaye Pollack, MD 04/29/2017

## 2017-05-07 NOTE — Pre-Procedure Instructions (Addendum)
Theresa Cain  05/07/2017      Theresa Cain [address] [place] [address] Phone: [phone] Fax: [phone]    Your procedure is scheduled on Feb 19.  Report to [facility] Admitting at 800 A.M.  Call this number if you have problems the morning of surgery:  [phone]   Remember:  Do not eat food or drink liquids after midnight.  Take these medicines the morning of surgery with A SIP OF WATER - none  Last dose of Eliquis will be 05-07-17  Last dose Lovenox will be morning before surgery 05-11-17 Stop taking aspirin, BC's, Goody's, Herbal medications, Fish Oil, Ibuprofen, Advil, motrin, Vitamins, Aleve   Do not wear jewelry, make-up or nail polish.  Do not wear lotions, powders, or perfumes, or deodorant.  Do not shave 48 hours prior to surgery.  Men may shave face and neck.  Do not bring valuables to the hospital.  [name] is not responsible for any belongings or valuables.  Contacts, dentures or bridgework may not be worn into surgery.  Leave your suitcase in the car.  After surgery it may be brought to your room.  For patients admitted to the hospital, discharge time will be determined by your treatment team.  Patients discharged the day of surgery will not be allowed to drive home.    Special instructions:  [name] - Preparing for Surgery  Before surgery, you can play an important role.  Because skin is not sterile, your skin needs to be as free of germs as possible.  You can reduce the number of germs on you skin by washing with CHG (chlorahexidine gluconate) soap before surgery.  CHG is an antiseptic cleaner which kills germs and bonds with the skin to continue killing germs even after washing.  Please DO NOT use if you have an allergy to CHG or antibacterial soaps.  If your skin becomes reddened/irritated stop using the CHG and inform your nurse when you arrive at Short Stay.  Do not  shave (including legs and underarms) for at least 48 hours prior to the first CHG shower.  You may shave your face.  Please follow these instructions carefully:   1.  Shower with CHG Soap the night before surgery and the  morning of Surgery.  2.  If you choose to wash your hair, wash your hair first as usual with your normal shampoo.  3.  After you shampoo, rinse your hair and body thoroughly to remove the  Shampoo.  4.  Use CHG as you would any other liquid soap.  You can apply chg directly  to the skin and wash gently with scrungie or a clean washcloth.  5.  Apply the CHG Soap to your body ONLY FROM THE NECK DOWN.   Do not use on open wounds or open sores.  Avoid contact with your eyes,  ears, mouth and genitals (private parts).  Wash genitals (private parts) with your normal soap.  6.  Wash thoroughly, paying special attention to the area where your surgery will be performed.  7.  Thoroughly rinse your body with warm water from the neck down.  8.  DO NOT shower/wash with your normal soap after using and rinsing off  the CHG Soap.  9.  Pat yourself dry with a clean towel.            10.  Wear clean pajamas.  11.  Place clean sheets on your bed the night of your first shower and do not sleep with pets.  Day of Surgery  Do not apply any lotions/deoderants the morning of surgery.  Please wear clean clothes to the hospital/surgery center.     Please read over the following fact sheets that you were given. Pain Booklet, Coughing and Deep Breathing, MRSA Information and Surgical Site Infection Prevention

## 2017-05-08 ENCOUNTER — Ambulatory Visit (HOSPITAL_COMMUNITY)
Admission: RE | Admit: 2017-05-08 | Discharge: 2017-05-08 | Disposition: A | Discharge status: Home / Self Care | Payer: Medicare Other | Source: Ambulatory Visit | Attending: Cardiovascular Disease | Admitting: Cardiovascular Disease

## 2017-05-08 ENCOUNTER — Encounter (HOSPITAL_COMMUNITY): Payer: Self-pay

## 2017-05-08 ENCOUNTER — Encounter (HOSPITAL_COMMUNITY)
Admission: RE | Admit: 2017-05-08 | Discharge: 2017-05-08 | Disposition: A | Discharge status: Home / Self Care | Payer: Medicare Other | Source: Ambulatory Visit | Attending: Cardiovascular Disease | Admitting: Cardiovascular Disease

## 2017-05-08 ENCOUNTER — Other Ambulatory Visit: Payer: Self-pay

## 2017-05-08 DIAGNOSIS — Z79890 Hormone replacement therapy (postmenopausal): Secondary | ICD-10-CM | POA: Insufficient documentation

## 2017-05-08 DIAGNOSIS — I2510 Atherosclerotic heart disease of native coronary artery without angina pectoris: Secondary | ICD-10-CM | POA: Insufficient documentation

## 2017-05-08 DIAGNOSIS — I5032 Chronic diastolic (congestive) heart failure: Secondary | ICD-10-CM | POA: Insufficient documentation

## 2017-05-08 DIAGNOSIS — Z85028 Personal history of other malignant neoplasm of stomach: Secondary | ICD-10-CM | POA: Diagnosis not present

## 2017-05-08 DIAGNOSIS — Z8249 Family history of ischemic heart disease and other diseases of the circulatory system: Secondary | ICD-10-CM | POA: Diagnosis not present

## 2017-05-08 DIAGNOSIS — I482 Chronic atrial fibrillation: Secondary | ICD-10-CM | POA: Insufficient documentation

## 2017-05-08 DIAGNOSIS — Z79899 Other long term (current) drug therapy: Secondary | ICD-10-CM | POA: Diagnosis not present

## 2017-05-08 DIAGNOSIS — I350 Nonrheumatic aortic (valve) stenosis: Secondary | ICD-10-CM | POA: Diagnosis not present

## 2017-05-08 DIAGNOSIS — I739 Peripheral vascular disease, unspecified: Secondary | ICD-10-CM | POA: Insufficient documentation

## 2017-05-08 DIAGNOSIS — K219 Gastro-esophageal reflux disease without esophagitis: Secondary | ICD-10-CM | POA: Diagnosis not present

## 2017-05-08 DIAGNOSIS — Z7901 Long term (current) use of anticoagulants: Secondary | ICD-10-CM | POA: Insufficient documentation

## 2017-05-08 DIAGNOSIS — Z950 Presence of cardiac pacemaker: Secondary | ICD-10-CM | POA: Diagnosis not present

## 2017-05-08 DIAGNOSIS — Z01812 Encounter for preprocedural laboratory examination: Secondary | ICD-10-CM | POA: Insufficient documentation

## 2017-05-08 DIAGNOSIS — Z01818 Encounter for other preprocedural examination: Secondary | ICD-10-CM | POA: Diagnosis not present

## 2017-05-08 DIAGNOSIS — F419 Anxiety disorder, unspecified: Secondary | ICD-10-CM | POA: Diagnosis not present

## 2017-05-08 DIAGNOSIS — Z882 Allergy status to sulfonamides status: Secondary | ICD-10-CM | POA: Insufficient documentation

## 2017-05-08 DIAGNOSIS — E785 Hyperlipidemia, unspecified: Secondary | ICD-10-CM | POA: Insufficient documentation

## 2017-05-08 DIAGNOSIS — I110 Hypertensive heart disease with heart failure: Secondary | ICD-10-CM | POA: Insufficient documentation

## 2017-05-08 DIAGNOSIS — Z0183 Encounter for blood typing: Secondary | ICD-10-CM | POA: Diagnosis not present

## 2017-05-08 DIAGNOSIS — Z880 Allergy status to penicillin: Secondary | ICD-10-CM | POA: Diagnosis not present

## 2017-05-08 DIAGNOSIS — R9431 Abnormal electrocardiogram [ECG] [EKG]: Secondary | ICD-10-CM | POA: Insufficient documentation

## 2017-05-08 HISTORY — DX: Chronic kidney disease, unspecified: N18.9

## 2017-05-08 HISTORY — DX: Anemia, unspecified: D64.9

## 2017-05-08 HISTORY — DX: Hypothyroidism, unspecified: E03.9

## 2017-05-08 LAB — SURGICAL PCR SCREEN
MRSA, PCR: NEGATIVE
Staphylococcus aureus: POSITIVE — AB

## 2017-05-08 LAB — HEMOGLOBIN A1C
Hgb A1c MFr Bld: 4.7 % — ABNORMAL LOW (ref 4.8–5.6)
Mean Plasma Glucose: 88.19 mg/dL

## 2017-05-08 LAB — BLOOD GAS, ARTERIAL
Acid-Base Excess: 0.6 mmol/L (ref 0.0–2.0)
Bicarbonate: 25.2 mmol/L (ref 20.0–28.0)
DRAWN BY: 470591
FIO2: 21
O2 Saturation: 96.2 %
PATIENT TEMPERATURE: 98.6
pCO2 arterial: 44.1 mmHg (ref 32.0–48.0)
pH, Arterial: 7.374 (ref 7.350–7.450)
pO2, Arterial: 87.7 mmHg (ref 83.0–108.0)

## 2017-05-08 LAB — URINALYSIS, ROUTINE W REFLEX MICROSCOPIC
BILIRUBIN URINE: NEGATIVE
GLUCOSE, UA: NEGATIVE mg/dL
HGB URINE DIPSTICK: NEGATIVE
Ketones, ur: NEGATIVE mg/dL
Nitrite: NEGATIVE
Protein, ur: NEGATIVE mg/dL
SPECIFIC GRAVITY, URINE: 1.021 (ref 1.005–1.030)
pH: 5 (ref 5.0–8.0)

## 2017-05-08 LAB — CBC
HCT: 37.3 % (ref 36.0–46.0)
HEMOGLOBIN: 11.9 g/dL — AB (ref 12.0–15.0)
MCH: 29.3 pg (ref 26.0–34.0)
MCHC: 31.9 g/dL (ref 30.0–36.0)
MCV: 91.9 fL (ref 78.0–100.0)
Platelets: 179 10*3/uL (ref 150–400)
RBC: 4.06 MIL/uL (ref 3.87–5.11)
RDW: 15 % (ref 11.5–15.5)
WBC: 6.9 10*3/uL (ref 4.0–10.5)

## 2017-05-08 LAB — COMPREHENSIVE METABOLIC PANEL
ALBUMIN: 3.5 g/dL (ref 3.5–5.0)
ALK PHOS: 98 U/L (ref 38–126)
ALT: 20 U/L (ref 14–54)
AST: 40 U/L (ref 15–41)
Anion gap: 12 (ref 5–15)
BUN: 12 mg/dL (ref 6–20)
CALCIUM: 9 mg/dL (ref 8.9–10.3)
CO2: 20 mmol/L — ABNORMAL LOW (ref 22–32)
Chloride: 108 mmol/L (ref 101–111)
Creatinine, Ser: 1.28 mg/dL — ABNORMAL HIGH (ref 0.44–1.00)
GFR calc Af Amer: 42 mL/min — ABNORMAL LOW (ref >60)
GFR calc non Af Amer: 36 mL/min — ABNORMAL LOW (ref >60)
GLUCOSE: 121 mg/dL — AB (ref 65–99)
POTASSIUM: 4.7 mmol/L (ref 3.5–5.1)
Sodium: 140 mmol/L (ref 135–145)
Total Bilirubin: 0.6 mg/dL (ref 0.3–1.2)
Total Protein: 6.7 g/dL (ref 6.5–8.1)

## 2017-05-08 LAB — BRAIN NATRIURETIC PEPTIDE: B NATRIURETIC PEPTIDE 5: 218.8 pg/mL — AB (ref 0.0–100.0)

## 2017-05-08 LAB — TYPE AND SCREEN
ABO/RH(D): B NEG
Antibody Screen: NEGATIVE

## 2017-05-08 LAB — PROTIME-INR
INR: 1.15
Prothrombin Time: 14.6 seconds (ref 11.4–15.2)

## 2017-05-08 LAB — ABO/RH: ABO/RH(D): B NEG

## 2017-05-08 LAB — APTT: aPTT: 34 seconds (ref 24–36)

## 2017-05-08 NOTE — Progress Notes (Signed)
email message sent to [employer] rep to inform the rep of surgery date and time

## 2017-05-08 NOTE — Progress Notes (Signed)
Script called into [facility] [employer] [facility] for Mupirocin. Ms Kaufman daughter called and informed of the need to pick up the script and the results of the PCR screen.

## 2017-05-11 MED ORDER — SODIUM CHLORIDE 0.9 % IV SOLN
30.0000 ug/min | INTRAVENOUS | Status: DC
  Filled 2017-05-11: qty 2

## 2017-05-11 MED ORDER — POTASSIUM CHLORIDE 2 MEQ/ML IV SOLN
80.0000 meq | INTRAVENOUS | Status: DC
  Filled 2017-05-11: qty 40

## 2017-05-11 MED ORDER — MAGNESIUM SULFATE 50 % IJ SOLN
40.0000 meq | INTRAMUSCULAR | Status: DC
  Filled 2017-05-11: qty 9.85

## 2017-05-11 MED ORDER — NITROGLYCERIN IN D5W 200-5 MCG/ML-% IV SOLN
2.0000 ug/min | INTRAVENOUS | Status: DC
  Filled 2017-05-11: qty 250

## 2017-05-11 MED ORDER — HEPARIN SODIUM (PORCINE) 1000 UNIT/ML IJ SOLN
INTRAMUSCULAR | Status: DC
  Filled 2017-05-11: qty 30

## 2017-05-11 MED ORDER — DEXMEDETOMIDINE HCL IN NACL 400 MCG/100ML IV SOLN
0.1000 ug/kg/h | INTRAVENOUS | Status: AC
  Administered 2017-05-12: 1 ug/kg/h via INTRAVENOUS
  Filled 2017-05-11: qty 100

## 2017-05-11 MED ORDER — SODIUM CHLORIDE 0.9 % IV SOLN
INTRAVENOUS | Status: DC
  Filled 2017-05-11: qty 1

## 2017-05-11 MED ORDER — VANCOMYCIN HCL 10 G IV SOLR
1500.0000 mg | INTRAVENOUS | Status: AC
  Administered 2017-05-12: 1500 mg via INTRAVENOUS
  Filled 2017-05-11: qty 1500

## 2017-05-11 MED ORDER — NOREPINEPHRINE BITARTRATE 1 MG/ML IV SOLN
0.0000 ug/min | INTRAVENOUS | Status: DC
  Filled 2017-05-11: qty 4

## 2017-05-11 MED ORDER — DOPAMINE-DEXTROSE 3.2-5 MG/ML-% IV SOLN
0.0000 ug/kg/min | INTRAVENOUS | Status: DC
  Filled 2017-05-11: qty 250

## 2017-05-11 MED ORDER — EPINEPHRINE PF 1 MG/ML IJ SOLN
0.0000 ug/min | INTRAMUSCULAR | Status: DC
  Filled 2017-05-11: qty 4

## 2017-05-11 NOTE — Anesthesia Preprocedure Evaluation (Addendum)
Anesthesia Evaluation  Patient identified by MRN, date of birth, ID band Patient awake    Reviewed: Allergy & Precautions, H&P , NPO status , Patient's Chart, lab work & pertinent test results  Airway Mallampati: II  TM Distance: >3 FB Neck ROM: Full    Dental no notable dental hx. (+) Edentulous Upper, Dental Advisory Given   Pulmonary neg pulmonary ROS,    Pulmonary exam normal breath sounds clear to auscultation       Cardiovascular Exercise Tolerance: Good hypertension, Pt. on medications + CAD, + Past MI and + Peripheral Vascular Disease  + dysrhythmias Atrial Fibrillation + pacemaker + Valvular Problems/Murmurs AS  Rhythm:Regular Rate:Normal + Systolic murmurs    Neuro/Psych Anxiety negative neurological ROS  negative psych ROS   GI/Hepatic Neg liver ROS, GERD  ,  Endo/Other  negative endocrine ROSHypothyroidism   Renal/GU Renal InsufficiencyRenal disease  negative genitourinary   Musculoskeletal  (+) Arthritis ,   Abdominal   Peds  Hematology negative hematology ROS (+) anemia ,   Anesthesia Other Findings   Reproductive/Obstetrics negative OB ROS                            Anesthesia Physical Anesthesia Plan  ASA: IV  Anesthesia Plan: MAC   Post-op Pain Management:    Induction: Intravenous  PONV Risk Score and Plan: 2 and Treatment may vary due to age or medical condition and Ondansetron  Airway Management Planned: Simple Face Mask  Additional Equipment: Arterial line, CVP and Ultrasound Guidance Line Placement  Intra-op Plan:   Post-operative Plan:   Informed Consent: I have reviewed the patients History and Physical, chart, labs and discussed the procedure including the risks, benefits and alternatives for the proposed anesthesia with the patient or authorized representative who has indicated his/her understanding and acceptance.   Dental advisory given  Plan  Discussed with: CRNA  Anesthesia Plan Comments:         Anesthesia Quick Evaluation

## 2017-05-12 ENCOUNTER — Other Ambulatory Visit: Payer: Self-pay

## 2017-05-12 ENCOUNTER — Inpatient Hospital Stay (HOSPITAL_COMMUNITY)
Admission: RE | Admit: 2017-05-12 | Discharge: 2017-05-13 | DRG: 266 | Disposition: A | Discharge status: Home / Self Care | Payer: Medicare Other | Source: Ambulatory Visit | Attending: Cardiovascular Disease | Admitting: Cardiovascular Disease

## 2017-05-12 ENCOUNTER — Encounter (HOSPITAL_COMMUNITY)
Admission: RE | Disposition: A | Discharge status: Home / Self Care | Payer: Self-pay | Source: Ambulatory Visit | Attending: Cardiovascular Disease

## 2017-05-12 ENCOUNTER — Inpatient Hospital Stay (HOSPITAL_COMMUNITY): Payer: Medicare Other

## 2017-05-12 ENCOUNTER — Encounter (HOSPITAL_COMMUNITY): Payer: Self-pay | Admitting: *Deleted

## 2017-05-12 ENCOUNTER — Inpatient Hospital Stay (HOSPITAL_COMMUNITY): Payer: Medicare Other | Admitting: Emergency Medicine

## 2017-05-12 DIAGNOSIS — I130 Hypertensive heart and chronic kidney disease with heart failure and stage 1 through stage 4 chronic kidney disease, or unspecified chronic kidney disease: Secondary | ICD-10-CM | POA: Diagnosis present

## 2017-05-12 DIAGNOSIS — Z880 Allergy status to penicillin: Secondary | ICD-10-CM

## 2017-05-12 DIAGNOSIS — Z006 Encounter for examination for normal comparison and control in clinical research program: Secondary | ICD-10-CM

## 2017-05-12 DIAGNOSIS — I350 Nonrheumatic aortic (valve) stenosis: Principal | ICD-10-CM

## 2017-05-12 DIAGNOSIS — I482 Chronic atrial fibrillation: Secondary | ICD-10-CM | POA: Diagnosis not present

## 2017-05-12 DIAGNOSIS — M1711 Unilateral primary osteoarthritis, right knee: Secondary | ICD-10-CM | POA: Diagnosis present

## 2017-05-12 DIAGNOSIS — Z8249 Family history of ischemic heart disease and other diseases of the circulatory system: Secondary | ICD-10-CM

## 2017-05-12 DIAGNOSIS — I739 Peripheral vascular disease, unspecified: Secondary | ICD-10-CM | POA: Diagnosis not present

## 2017-05-12 DIAGNOSIS — Z6828 Body mass index (BMI) 28.0-28.9, adult: Secondary | ICD-10-CM | POA: Diagnosis not present

## 2017-05-12 DIAGNOSIS — Z85068 Personal history of other malignant neoplasm of small intestine: Secondary | ICD-10-CM

## 2017-05-12 DIAGNOSIS — D509 Iron deficiency anemia, unspecified: Secondary | ICD-10-CM | POA: Diagnosis present

## 2017-05-12 DIAGNOSIS — R350 Frequency of micturition: Secondary | ICD-10-CM | POA: Diagnosis present

## 2017-05-12 DIAGNOSIS — Z9841 Cataract extraction status, right eye: Secondary | ICD-10-CM

## 2017-05-12 DIAGNOSIS — F419 Anxiety disorder, unspecified: Secondary | ICD-10-CM | POA: Diagnosis present

## 2017-05-12 DIAGNOSIS — K219 Gastro-esophageal reflux disease without esophagitis: Secondary | ICD-10-CM | POA: Diagnosis present

## 2017-05-12 DIAGNOSIS — Z952 Presence of prosthetic heart valve: Secondary | ICD-10-CM

## 2017-05-12 DIAGNOSIS — Z972 Presence of dental prosthetic device (complete) (partial): Secondary | ICD-10-CM

## 2017-05-12 DIAGNOSIS — I236 Thrombosis of atrium, auricular appendage, and ventricle as current complications following acute myocardial infarction: Secondary | ICD-10-CM | POA: Diagnosis not present

## 2017-05-12 DIAGNOSIS — Z90411 Acquired partial absence of pancreas: Secondary | ICD-10-CM

## 2017-05-12 DIAGNOSIS — R011 Cardiac murmur, unspecified: Secondary | ICD-10-CM | POA: Diagnosis present

## 2017-05-12 DIAGNOSIS — N183 Chronic kidney disease, stage 3 (moderate): Secondary | ICD-10-CM | POA: Diagnosis not present

## 2017-05-12 DIAGNOSIS — Z79899 Other long term (current) drug therapy: Secondary | ICD-10-CM

## 2017-05-12 DIAGNOSIS — I4821 Permanent atrial fibrillation: Secondary | ICD-10-CM | POA: Diagnosis present

## 2017-05-12 DIAGNOSIS — Z9049 Acquired absence of other specified parts of digestive tract: Secondary | ICD-10-CM

## 2017-05-12 DIAGNOSIS — E039 Hypothyroidism, unspecified: Secondary | ICD-10-CM | POA: Diagnosis present

## 2017-05-12 DIAGNOSIS — E785 Hyperlipidemia, unspecified: Secondary | ICD-10-CM | POA: Diagnosis not present

## 2017-05-12 DIAGNOSIS — R2681 Unsteadiness on feet: Secondary | ICD-10-CM | POA: Diagnosis present

## 2017-05-12 DIAGNOSIS — G629 Polyneuropathy, unspecified: Secondary | ICD-10-CM | POA: Diagnosis not present

## 2017-05-12 DIAGNOSIS — I252 Old myocardial infarction: Secondary | ICD-10-CM

## 2017-05-12 DIAGNOSIS — I2510 Atherosclerotic heart disease of native coronary artery without angina pectoris: Secondary | ICD-10-CM | POA: Diagnosis not present

## 2017-05-12 DIAGNOSIS — Z7901 Long term (current) use of anticoagulants: Secondary | ICD-10-CM

## 2017-05-12 DIAGNOSIS — M479 Spondylosis, unspecified: Secondary | ICD-10-CM | POA: Diagnosis present

## 2017-05-12 DIAGNOSIS — Z961 Presence of intraocular lens: Secondary | ICD-10-CM | POA: Diagnosis present

## 2017-05-12 DIAGNOSIS — Z90710 Acquired absence of both cervix and uterus: Secondary | ICD-10-CM

## 2017-05-12 DIAGNOSIS — I5033 Acute on chronic diastolic (congestive) heart failure: Secondary | ICD-10-CM | POA: Diagnosis not present

## 2017-05-12 DIAGNOSIS — I351 Nonrheumatic aortic (valve) insufficiency: Secondary | ICD-10-CM | POA: Diagnosis not present

## 2017-05-12 DIAGNOSIS — Z882 Allergy status to sulfonamides status: Secondary | ICD-10-CM

## 2017-05-12 DIAGNOSIS — E669 Obesity, unspecified: Secondary | ICD-10-CM | POA: Diagnosis present

## 2017-05-12 DIAGNOSIS — I10 Essential (primary) hypertension: Secondary | ICD-10-CM | POA: Diagnosis present

## 2017-05-12 DIAGNOSIS — Z950 Presence of cardiac pacemaker: Secondary | ICD-10-CM

## 2017-05-12 DIAGNOSIS — N189 Chronic kidney disease, unspecified: Secondary | ICD-10-CM | POA: Diagnosis present

## 2017-05-12 DIAGNOSIS — Z79890 Hormone replacement therapy (postmenopausal): Secondary | ICD-10-CM

## 2017-05-12 HISTORY — PX: TRANSCATHETER AORTIC VALVE REPLACEMENT, TRANSFEMORAL: SHX6400

## 2017-05-12 HISTORY — PX: TEE WITHOUT CARDIOVERSION: SHX5443

## 2017-05-12 HISTORY — DX: Presence of prosthetic heart valve: Z95.2

## 2017-05-12 LAB — CBC
HEMATOCRIT: 32 % — AB (ref 36.0–46.0)
Hemoglobin: 10.2 g/dL — ABNORMAL LOW (ref 12.0–15.0)
MCH: 28.9 pg (ref 26.0–34.0)
MCHC: 31.9 g/dL (ref 30.0–36.0)
MCV: 90.7 fL (ref 78.0–100.0)
Platelets: 119 10*3/uL — ABNORMAL LOW (ref 150–400)
RBC: 3.53 MIL/uL — ABNORMAL LOW (ref 3.87–5.11)
RDW: 14.5 % (ref 11.5–15.5)
WBC: 5.2 10*3/uL (ref 4.0–10.5)

## 2017-05-12 LAB — POCT I-STAT, CHEM 8
BUN: 15 mg/dL (ref 6–20)
BUN: 15 mg/dL (ref 6–20)
CHLORIDE: 101 mmol/L (ref 101–111)
CHLORIDE: 102 mmol/L (ref 101–111)
CREATININE: 1.3 mg/dL — AB (ref 0.44–1.00)
CREATININE: 1.3 mg/dL — AB (ref 0.44–1.00)
Calcium, Ion: 1.15 mmol/L (ref 1.15–1.40)
Calcium, Ion: 1.13 mmol/L — ABNORMAL LOW (ref 1.15–1.40)
GLUCOSE: 130 mg/dL — AB (ref 65–99)
Glucose, Bld: 120 mg/dL — ABNORMAL HIGH (ref 65–99)
HCT: 30 % — ABNORMAL LOW (ref 36.0–46.0)
HCT: 29 % — ABNORMAL LOW (ref 36.0–46.0)
Hemoglobin: 10.2 g/dL — ABNORMAL LOW (ref 12.0–15.0)
Hemoglobin: 9.9 g/dL — ABNORMAL LOW (ref 12.0–15.0)
POTASSIUM: 4.1 mmol/L (ref 3.5–5.1)
Potassium: 4.1 mmol/L (ref 3.5–5.1)
Sodium: 139 mmol/L (ref 135–145)
Sodium: 140 mmol/L (ref 135–145)
TCO2: 28 mmol/L (ref 22–32)
TCO2: 27 mmol/L (ref 22–32)

## 2017-05-12 LAB — PROTIME-INR
INR: 1.25
Prothrombin Time: 15.6 seconds — ABNORMAL HIGH (ref 11.4–15.2)

## 2017-05-12 LAB — APTT: aPTT: 35 seconds (ref 24–36)

## 2017-05-12 SURGERY — IMPLANTATION, AORTIC VALVE, TRANSCATHETER, FEMORAL APPROACH
Anesthesia: Monitor Anesthesia Care | Site: Chest

## 2017-05-12 MED ORDER — METOPROLOL TARTRATE 5 MG/5ML IV SOLN: 2.5000 mg | INTRAVENOUS | Status: DC | PRN

## 2017-05-12 MED ORDER — LIDOCAINE HCL 1 % IJ SOLN
INTRAMUSCULAR | Status: DC | PRN
  Administered 2017-05-12: 5 mL

## 2017-05-12 MED ORDER — LEVOTHYROXINE SODIUM 50 MCG PO TABS
50.0000 ug | ORAL_TABLET | Freq: Every day | ORAL | Status: DC
  Administered 2017-05-13: 50 ug via ORAL
  Filled 2017-05-12: qty 1

## 2017-05-12 MED ORDER — LIDOCAINE HCL (PF) 1 % IJ SOLN
INTRAMUSCULAR | Status: AC
  Filled 2017-05-12: qty 30

## 2017-05-12 MED ORDER — ONDANSETRON HCL 4 MG/2ML IJ SOLN
INTRAMUSCULAR | Status: AC
  Filled 2017-05-12: qty 2

## 2017-05-12 MED ORDER — MIDAZOLAM HCL 2 MG/2ML IJ SOLN
INTRAMUSCULAR | Status: AC
  Filled 2017-05-12: qty 2

## 2017-05-12 MED ORDER — ASPIRIN 81 MG PO CHEW: 81.0000 mg | CHEWABLE_TABLET | Freq: Every day | ORAL | Status: DC

## 2017-05-12 MED ORDER — VANCOMYCIN HCL IN DEXTROSE 1-5 GM/200ML-% IV SOLN
1000.0000 mg | Freq: Once | INTRAVENOUS | Status: AC
  Administered 2017-05-12: 1000 mg via INTRAVENOUS
  Filled 2017-05-12: qty 200

## 2017-05-12 MED ORDER — ACETAMINOPHEN 160 MG/5ML PO SOLN: 1000.0000 mg | Freq: Four times a day (QID) | ORAL | Status: DC

## 2017-05-12 MED ORDER — ONDANSETRON HCL 4 MG/2ML IJ SOLN: 4.0000 mg | Freq: Four times a day (QID) | INTRAMUSCULAR | Status: DC | PRN

## 2017-05-12 MED ORDER — LACTATED RINGERS IV SOLN: 500.0000 mL | Freq: Once | INTRAVENOUS | Status: DC | PRN

## 2017-05-12 MED ORDER — PROPOFOL 500 MG/50ML IV EMUL
INTRAVENOUS | Status: DC | PRN
  Administered 2017-05-12: 10 ug/kg/min via INTRAVENOUS

## 2017-05-12 MED ORDER — FENTANYL CITRATE (PF) 100 MCG/2ML IJ SOLN
INTRAMUSCULAR | Status: AC
  Administered 2017-05-12: 50 ug
  Filled 2017-05-12: qty 2

## 2017-05-12 MED ORDER — MUPIROCIN 2 % EX OINT
TOPICAL_OINTMENT | Freq: Two times a day (BID) | CUTANEOUS | Status: DC
  Administered 2017-05-12: 22:00:00 via NASAL
  Administered 2017-05-13: 1 via NASAL
  Filled 2017-05-12: qty 22

## 2017-05-12 MED ORDER — PROTAMINE SULFATE 10 MG/ML IV SOLN
INTRAVENOUS | Status: DC | PRN
  Administered 2017-05-12: 40 mg via INTRAVENOUS
  Administered 2017-05-12: 30 mg via INTRAVENOUS
  Administered 2017-05-12: 60 mg via INTRAVENOUS
  Administered 2017-05-12: 30 mg via INTRAVENOUS

## 2017-05-12 MED ORDER — DEXTROSE 5 % IV SOLN: 0.0000 ug/min | INTRAVENOUS | Status: DC

## 2017-05-12 MED ORDER — CHLORHEXIDINE GLUCONATE 4 % EX LIQD: 60.0000 mL | Freq: Once | CUTANEOUS | Status: DC

## 2017-05-12 MED ORDER — CHLORHEXIDINE GLUCONATE CLOTH 2 % EX PADS
6.0000 | MEDICATED_PAD | Freq: Every day | CUTANEOUS | Status: DC
  Administered 2017-05-13: 6 via TOPICAL

## 2017-05-12 MED ORDER — 0.9 % SODIUM CHLORIDE (POUR BTL) OPTIME
TOPICAL | Status: DC | PRN
  Administered 2017-05-12: 4000 mL

## 2017-05-12 MED ORDER — SODIUM CHLORIDE 0.9 % IV SOLN
INTRAVENOUS | Status: DC
  Administered 2017-05-12: 12:00:00 via INTRAVENOUS

## 2017-05-12 MED ORDER — NITROGLYCERIN IN D5W 200-5 MCG/ML-% IV SOLN: 0.0000 ug/min | INTRAVENOUS | Status: DC

## 2017-05-12 MED ORDER — SODIUM CHLORIDE 0.9 % IV SOLN
INTRAVENOUS | Status: DC | PRN
  Administered 2017-05-12: 1500 mL

## 2017-05-12 MED ORDER — FENTANYL CITRATE (PF) 100 MCG/2ML IJ SOLN: 50.0000 ug | Freq: Once | INTRAMUSCULAR | Status: DC

## 2017-05-12 MED ORDER — SODIUM CHLORIDE 0.9% FLUSH
10.0000 mL | Freq: Two times a day (BID) | INTRAVENOUS | Status: DC
  Administered 2017-05-13: 10 mL

## 2017-05-12 MED ORDER — GABAPENTIN 300 MG PO CAPS
600.0000 mg | ORAL_CAPSULE | Freq: Two times a day (BID) | ORAL | Status: DC
  Administered 2017-05-12 – 2017-05-13 (×2): 600 mg via ORAL
  Filled 2017-05-12 (×2): qty 2

## 2017-05-12 MED ORDER — SODIUM CHLORIDE 0.9 % IV SOLN
0.0000 ug/min | INTRAVENOUS | Status: DC
  Filled 2017-05-12: qty 2

## 2017-05-12 MED ORDER — PROPOFOL 10 MG/ML IV BOLUS
INTRAVENOUS | Status: DC | PRN
  Administered 2017-05-12: 15 mg via INTRAVENOUS

## 2017-05-12 MED ORDER — CHLORHEXIDINE GLUCONATE 4 % EX LIQD: 30.0000 mL | CUTANEOUS | Status: DC

## 2017-05-12 MED ORDER — OXYCODONE HCL 5 MG PO TABS: 5.0000 mg | ORAL_TABLET | ORAL | Status: DC | PRN

## 2017-05-12 MED ORDER — ESCITALOPRAM OXALATE 10 MG PO TABS
10.0000 mg | ORAL_TABLET | Freq: Every day | ORAL | Status: DC
  Administered 2017-05-12: 10 mg via ORAL
  Filled 2017-05-12: qty 1

## 2017-05-12 MED ORDER — DEXMEDETOMIDINE HCL 200 MCG/2ML IV SOLN
INTRAVENOUS | Status: DC | PRN
  Administered 2017-05-12: 93 ug via INTRAVENOUS

## 2017-05-12 MED ORDER — ACETAMINOPHEN 500 MG PO TABS
1000.0000 mg | ORAL_TABLET | Freq: Four times a day (QID) | ORAL | Status: DC
  Administered 2017-05-12 – 2017-05-13 (×3): 1000 mg via ORAL
  Filled 2017-05-12 (×3): qty 2

## 2017-05-12 MED ORDER — LEVOFLOXACIN IN D5W 500 MG/100ML IV SOLN
500.0000 mg | INTRAVENOUS | Status: AC
  Administered 2017-05-12: 500 mg via INTRAVENOUS
  Filled 2017-05-12: qty 100

## 2017-05-12 MED ORDER — PANTOPRAZOLE SODIUM 40 MG PO TBEC
40.0000 mg | DELAYED_RELEASE_TABLET | Freq: Every day | ORAL | Status: DC
  Administered 2017-05-12 – 2017-05-13 (×2): 40 mg via ORAL
  Filled 2017-05-12 (×2): qty 1

## 2017-05-12 MED ORDER — SODIUM CHLORIDE 0.9% FLUSH: 10.0000 mL | INTRAVENOUS | Status: DC | PRN

## 2017-05-12 MED ORDER — ASPIRIN EC 81 MG PO TBEC
81.0000 mg | DELAYED_RELEASE_TABLET | Freq: Every day | ORAL | Status: DC
  Administered 2017-05-13: 81 mg via ORAL
  Filled 2017-05-12: qty 1

## 2017-05-12 MED ORDER — ALBUMIN HUMAN 5 % IV SOLN
250.0000 mL | INTRAVENOUS | Status: AC | PRN
  Administered 2017-05-12 (×2): 250 mL via INTRAVENOUS
  Filled 2017-05-12 (×2): qty 250

## 2017-05-12 MED ORDER — VITAMIN D (ERGOCALCIFEROL) 1.25 MG (50000 UNIT) PO CAPS: 50000.0000 [IU] | ORAL_CAPSULE | ORAL | Status: DC

## 2017-05-12 MED ORDER — PRAVASTATIN SODIUM 40 MG PO TABS
40.0000 mg | ORAL_TABLET | Freq: Every evening | ORAL | Status: DC
  Administered 2017-05-12: 40 mg via ORAL
  Filled 2017-05-12: qty 1

## 2017-05-12 MED ORDER — CHLORHEXIDINE GLUCONATE 0.12 % MT SOLN
15.0000 mL | Freq: Once | OROMUCOSAL | Status: AC
  Administered 2017-05-12: 15 mL via OROMUCOSAL
  Filled 2017-05-12: qty 15

## 2017-05-12 MED ORDER — LACTATED RINGERS IV SOLN
INTRAVENOUS | Status: DC | PRN
  Administered 2017-05-12: 10:00:00 via INTRAVENOUS

## 2017-05-12 MED ORDER — HEPARIN SODIUM (PORCINE) 1000 UNIT/ML IJ SOLN
INTRAMUSCULAR | Status: DC | PRN
  Administered 2017-05-12: 16000 [IU] via INTRAVENOUS

## 2017-05-12 MED ORDER — TRAMADOL HCL 50 MG PO TABS: 50.0000 mg | ORAL_TABLET | ORAL | Status: DC | PRN

## 2017-05-12 MED ORDER — MORPHINE SULFATE (PF) 4 MG/ML IV SOLN: 2.0000 mg | INTRAVENOUS | Status: DC | PRN

## 2017-05-12 MED ORDER — SODIUM CHLORIDE 0.9 % IV SOLN
INTRAVENOUS | Status: DC | PRN
  Administered 2017-05-12: 10:00:00 via INTRAVENOUS

## 2017-05-12 MED ORDER — MIDAZOLAM HCL 2 MG/2ML IJ SOLN: 2.0000 mg | INTRAMUSCULAR | Status: DC | PRN

## 2017-05-12 MED ORDER — ONDANSETRON HCL 4 MG/2ML IJ SOLN
INTRAMUSCULAR | Status: DC | PRN
  Administered 2017-05-12: 4 mg via INTRAVENOUS

## 2017-05-12 MED ORDER — LEVOFLOXACIN IN D5W 750 MG/150ML IV SOLN
750.0000 mg | INTRAVENOUS | Status: AC
  Administered 2017-05-13: 750 mg via INTRAVENOUS
  Filled 2017-05-12: qty 150

## 2017-05-12 MED ORDER — FERROUS SULFATE 325 (65 FE) MG PO TABS
325.0000 mg | ORAL_TABLET | Freq: Every day | ORAL | Status: DC
  Administered 2017-05-13: 325 mg via ORAL
  Filled 2017-05-12: qty 1

## 2017-05-12 MED ORDER — SODIUM CHLORIDE 0.9 % IV SOLN
INTRAVENOUS | Status: DC
  Administered 2017-05-12: 09:00:00 via INTRAVENOUS

## 2017-05-12 MED ORDER — IODIXANOL 320 MG/ML IV SOLN
INTRAVENOUS | Status: DC | PRN
  Administered 2017-05-12: 40.4 mL via INTRAVENOUS

## 2017-05-12 SURGICAL SUPPLY — 113 items
ADAPTER UNIV SWAN GANZ BIP (ADAPTER) ×1 IMPLANT
ADAPTER UNV SWAN GANZ BIP (ADAPTER) ×2
ATTRACTOMAT 16X20 MAGNETIC DRP (DRAPES) IMPLANT
BAG BANDED W/RUBBER/TAPE 36X54 (MISCELLANEOUS) ×3 IMPLANT
BAG DECANTER FOR FLEXI CONT (MISCELLANEOUS) ×3 IMPLANT
BAG SNAP BAND KOVER 36X36 (MISCELLANEOUS) ×6 IMPLANT
BLADE 10 SAFETY STRL DISP (BLADE) IMPLANT
BLADE CLIPPER SURG (BLADE) IMPLANT
BLADE STERNUM SYSTEM 6 (BLADE) IMPLANT
CABLE ADAPT CONN TEMP 6FT (ADAPTER) ×3 IMPLANT
CABLE PACING FASLOC BIEGE (MISCELLANEOUS) IMPLANT
CABLE PACING FASLOC BLUE (MISCELLANEOUS) IMPLANT
CANISTER SUCT 3000ML PPV (MISCELLANEOUS) IMPLANT
CANNULA FEM VENOUS REMOTE 22FR (CANNULA) IMPLANT
CANNULA OPTISITE PERFUSION 16F (CANNULA) IMPLANT
CANNULA OPTISITE PERFUSION 18F (CANNULA) IMPLANT
CATH DIAG EXPO 6F FR4 (CATHETERS) ×3 IMPLANT
CATH DIAG EXPO 6F VENT PIG 145 (CATHETERS) ×6 IMPLANT
CATH EXPO 5FR AL1 (CATHETERS) ×3 IMPLANT
CATH INFINITI 6F AL2 (CATHETERS) ×3 IMPLANT
CATH S G BIP PACING (SET/KITS/TRAYS/PACK) ×6 IMPLANT
CLIP VESOCCLUDE MED 24/CT (CLIP) IMPLANT
CLIP VESOCCLUDE SM WIDE 24/CT (CLIP) IMPLANT
CONT SPEC 4OZ CLIKSEAL STRL BL (MISCELLANEOUS) ×6 IMPLANT
COVER BACK TABLE 60X90IN (DRAPES) ×3 IMPLANT
COVER BACK TABLE 80X110 HD (DRAPES) ×3 IMPLANT
COVER DOME SNAP 22 D (MISCELLANEOUS) ×3 IMPLANT
COVER MAYO STAND STRL (DRAPES) IMPLANT
COVER PROBE W GEL 5X96 (DRAPES) ×3 IMPLANT
CRADLE DONUT ADULT HEAD (MISCELLANEOUS) ×3 IMPLANT
DERMABOND ADVANCED (GAUZE/BANDAGES/DRESSINGS) ×2
DERMABOND ADVANCED .7 DNX12 (GAUZE/BANDAGES/DRESSINGS) ×1 IMPLANT
DEVICE CLOSURE PERCLS PRGLD 6F (VASCULAR PRODUCTS) ×2 IMPLANT
DRAPE INCISE IOBAN 66X45 STRL (DRAPES) IMPLANT
DRAPE SLUSH MACHINE 52X66 (DRAPES) ×3 IMPLANT
DRSG TEGADERM 4X4.75 (GAUZE/BANDAGES/DRESSINGS) ×6 IMPLANT
ELECT REM PT RETURN 9FT ADLT (ELECTROSURGICAL) ×6
ELECTRODE REM PT RTRN 9FT ADLT (ELECTROSURGICAL) ×2 IMPLANT
FELT TEFLON 6X6 (MISCELLANEOUS) IMPLANT
FEMORAL VENOUS CANN RAP (CANNULA) IMPLANT
GAUZE SPONGE 4X4 12PLY STRL (GAUZE/BANDAGES/DRESSINGS) ×3 IMPLANT
GAUZE SPONGE 4X4 12PLY STRL LF (GAUZE/BANDAGES/DRESSINGS) ×3 IMPLANT
GLOVE BIO SURGEON STRL SZ 6.5 (GLOVE) ×2 IMPLANT
GLOVE BIO SURGEON STRL SZ7.5 (GLOVE) ×3 IMPLANT
GLOVE BIO SURGEON STRL SZ8 (GLOVE) ×3 IMPLANT
GLOVE BIO SURGEONS STRL SZ 6.5 (GLOVE) ×1
GLOVE BIOGEL PI IND STRL 6.5 (GLOVE) ×5 IMPLANT
GLOVE BIOGEL PI IND STRL 7.5 (GLOVE) ×2 IMPLANT
GLOVE BIOGEL PI IND STRL 8 (GLOVE) ×2 IMPLANT
GLOVE BIOGEL PI INDICATOR 6.5 (GLOVE) ×10
GLOVE BIOGEL PI INDICATOR 7.5 (GLOVE) ×4
GLOVE BIOGEL PI INDICATOR 8 (GLOVE) ×4
GLOVE ECLIPSE 7.5 STRL STRAW (GLOVE) ×3 IMPLANT
GLOVE ECLIPSE 8.0 STRL XLNG CF (GLOVE) ×3 IMPLANT
GLOVE EUDERMIC 7 POWDERFREE (GLOVE) IMPLANT
GLOVE ORTHO TXT STRL SZ7.5 (GLOVE) ×3 IMPLANT
GOWN STRL REUS W/ TWL LRG LVL3 (GOWN DISPOSABLE) ×4 IMPLANT
GOWN STRL REUS W/ TWL XL LVL3 (GOWN DISPOSABLE) ×6 IMPLANT
GOWN STRL REUS W/TWL LRG LVL3 (GOWN DISPOSABLE) ×8
GOWN STRL REUS W/TWL XL LVL3 (GOWN DISPOSABLE) ×12
GUIDEWIRE SAF TJ AMPL .035X180 (WIRE) ×3 IMPLANT
GUIDEWIRE SAFE TJ AMPLATZ EXST (WIRE) ×3 IMPLANT
GUIDEWIRE STRAIGHT .035 260CM (WIRE) ×3 IMPLANT
INSERT FOGARTY 61MM (MISCELLANEOUS) IMPLANT
INSERT FOGARTY SM (MISCELLANEOUS) IMPLANT
INSERT FOGARTY XLG (MISCELLANEOUS) IMPLANT
KIT BASIN OR (CUSTOM PROCEDURE TRAY) ×3 IMPLANT
KIT DILATOR VASC 18G NDL (KITS) IMPLANT
KIT HEART LEFT (KITS) ×3 IMPLANT
KIT ROOM TURNOVER OR (KITS) ×3 IMPLANT
KIT SUCTION CATH 14FR (SUCTIONS) IMPLANT
NEEDLE PERC 18GX7CM (NEEDLE) ×3 IMPLANT
NS IRRIG 1000ML POUR BTL (IV SOLUTION) ×12 IMPLANT
PACK AORTA (CUSTOM PROCEDURE TRAY) ×3 IMPLANT
PAD ARMBOARD 7.5X6 YLW CONV (MISCELLANEOUS) ×6 IMPLANT
PAD ELECT DEFIB RADIOL ZOLL (MISCELLANEOUS) ×3 IMPLANT
PATCH TACHOSII LRG 9.5X4.8 (VASCULAR PRODUCTS) IMPLANT
PERCLOSE PROGLIDE 6F (VASCULAR PRODUCTS) ×6
SET MICROPUNCTURE 5F STIFF (MISCELLANEOUS) ×3 IMPLANT
SHEATH AVANTI 11CM 8FR (MISCELLANEOUS) ×3 IMPLANT
SHEATH PINNACLE 6F 10CM (SHEATH) ×6 IMPLANT
SLEEVE REPOSITIONING LENGTH 30 (MISCELLANEOUS) ×3 IMPLANT
SPONGE LAP 4X18 X RAY DECT (DISPOSABLE) IMPLANT
STOPCOCK MORSE 400PSI 3WAY (MISCELLANEOUS) ×6 IMPLANT
SUT ETHIBOND X763 2 0 SH 1 (SUTURE) IMPLANT
SUT GORETEX CV 4 TH 22 36 (SUTURE) IMPLANT
SUT GORETEX CV4 TH-18 (SUTURE) IMPLANT
SUT GORETEX TH-18 36 INCH (SUTURE) IMPLANT
SUT MNCRL AB 3-0 PS2 18 (SUTURE) IMPLANT
SUT PROLENE 3 0 SH1 36 (SUTURE) IMPLANT
SUT PROLENE 4 0 RB 1 (SUTURE)
SUT PROLENE 4-0 RB1 .5 CRCL 36 (SUTURE) IMPLANT
SUT PROLENE 5 0 C 1 36 (SUTURE) IMPLANT
SUT PROLENE 6 0 C 1 30 (SUTURE) IMPLANT
SUT SILK  1 MH (SUTURE) ×2
SUT SILK 1 MH (SUTURE) ×1 IMPLANT
SUT SILK 2 0 SH CR/8 (SUTURE) IMPLANT
SUT VIC AB 2-0 CT1 27 (SUTURE)
SUT VIC AB 2-0 CT1 TAPERPNT 27 (SUTURE) IMPLANT
SUT VIC AB 2-0 CTX 36 (SUTURE) IMPLANT
SUT VIC AB 3-0 SH 8-18 (SUTURE) IMPLANT
SYR 10ML LL (SYRINGE) ×9 IMPLANT
SYR 30ML LL (SYRINGE) ×6 IMPLANT
SYR 50ML LL SCALE MARK (SYRINGE) ×3 IMPLANT
TAPE CLOTH SURG 4X10 WHT LF (GAUZE/BANDAGES/DRESSINGS) ×3 IMPLANT
TOWEL OR 17X26 10 PK STRL BLUE (TOWEL DISPOSABLE) ×6 IMPLANT
TRANSDUCER W/STOPCOCK (MISCELLANEOUS) ×6 IMPLANT
TRAY FOLEY SILVER 14FR TEMP (SET/KITS/TRAYS/PACK) IMPLANT
TUBE SUCT INTRACARD DLP 20F (MISCELLANEOUS) IMPLANT
TUBING HIGH PRESSURE 120CM (CONNECTOR) ×3 IMPLANT
VALVE HEART TRANSCATH SZ3 23MM (Prosthesis & Implant Heart) ×3 IMPLANT
WIRE AMPLATZ SS-J .035X180CM (WIRE) ×3 IMPLANT
WIRE BENTSON .035X145CM (WIRE) ×3 IMPLANT

## 2017-05-12 NOTE — Transfer of Care (Signed)
Immediate Anesthesia Transfer of Care Note  Patient: Theresa Cain  Procedure(s) Performed: TRANSCATHETER AORTIC VALVE REPLACEMENT, TRANSFEMORAL using a 36m Edwards Sapien 3 Aortic Valve (N/A Chest) TRANSESOPHAGEAL ECHOCARDIOGRAM (TEE) (N/A Chest)  Patient Location: ICU  Anesthesia Type:MAC  Level of Consciousness: awake, alert  and oriented  Airway & Oxygen Therapy: Patient Spontanous Breathing and Patient connected to nasal cannula oxygen  Post-op Assessment: Report given to RN  Post vital signs: Reviewed and stable  Last Vitals:  Vitals:   05/12/17 0930 05/12/17 1113  BP: (!) 146/54   Pulse: 66 (!) 59  Resp: 17   Temp:    SpO2: 100%     Last Pain:  Vitals:   05/12/17 0814  TempSrc: Oral      Patients Stated Pain Goal: 2 ([id number]  Complications: No apparent anesthesia complications

## 2017-05-12 NOTE — Progress Notes (Signed)
  [facility] VALVE TEAM  Patient doing well s/p TAVR. She is hemodynamically stable. Groin sites stable. Tele with no high grade block. ECG not done. Plan to DC arterial line. Early ambulation and plan to transfer to tele tomorrow if she remains stable.   Angelena Form PA-C  [facility]  Pager [phone]

## 2017-05-12 NOTE — Progress Notes (Signed)
CT surgery p.m. Rounds  Sitting up in chair Comfortable and pleasant after transcatheter aVR No groin hematoma Heart rhythm stable

## 2017-05-12 NOTE — Progress Notes (Signed)
Pt. Reported she tripped over her cane and hit the door edge between her nose. She has bruising at the cheeks and nose area.

## 2017-05-12 NOTE — Anesthesia Procedure Notes (Signed)
Arterial Line Insertion Start/End2/19/2019 9:20 AM, 05/12/2017 9:28 AM Performed by: Wilburn Cornelia, CRNA, CRNA  Patient location: Pre-op. Preanesthetic checklist: patient identified, IV checked, site marked, risks and benefits discussed, surgical consent, monitors and equipment checked, pre-op evaluation and timeout performed Lidocaine 1% used for infiltration and patient sedated Right, radial was placed Catheter size: 20 G Hand hygiene performed  and maximum sterile barriers used  Allen's test indicative of satisfactory collateral circulation Attempts: 2 Procedure performed without using ultrasound guided technique. Following insertion, Biopatch and dressing applied. Post procedure complications: local hematoma. Patient tolerated the procedure well with no immediate complications.

## 2017-05-12 NOTE — Op Note (Signed)
[facility] VASC[facility]ENTER   MULTIDISCIPLINARY HEART VALVE TEAM   TAVR OPERATIVE NOTE   Date of Procedure:  05/12/2017  Preoperative Diagnosis: Severe Aortic Stenosis   Postoperative Diagnosis: Same   Procedure:    Transcatheter Aortic Valve Replacement - Percutaneous Transfemoral Approach  Edwards Sapien 3 THV (size 26 mm, model # 9600TFX, serial #  [id number]   Co-Surgeons:  Valentina Gu. Roxy Manns, MD and Sherren Mocha, MD  Anesthesiologist:  Dr Ola Spurr, MD  Echocardiographer:  Dr Johnsie Cancel  Pre-operative Echo Findings:  Severe aortic stenosis  Normal left ventricular systolic function  Post-operative Echo Findings:  No paravalvular leak  normal left ventricular systolic function  BRIEF CLINICAL NOTE AND INDICATIONS FOR SURGERY  [name] with permanent atrial fibrillation on chronic anticoagulation with apixaban.  She has a history of nonobstructive coronary artery disease, carotid artery disease status post remote CEA, chronic diastolic heart failure, chronic kidney disease, and paroxysmal VT.  She has developed progressive symptoms of severe aortic stenosis with exertional dyspnea and signs of congestive heart failure.  LV function has been preserved.  She has undergone extensive evaluation by the multidisciplinary heart team including right and left heart catheterization, CT angiogram studies of the heart and peripheral vessels, and cardiac surgical evaluation.  After this evaluation she is felt to be an appropriate candidate for transfemoral TAVR via a percutaneous right transfemoral approach with plans to use a 26 mm transcatheter heart valve.  She presents today for surgery.  During the course of the patient's preoperative work up they have been evaluated comprehensively by a multidisciplinary team of specialists coordinated through the [facility] in the [place].  They have been demonstrated to  suffer from symptomatic severe aortic stenosis as noted above. The patient has been counseled extensively as to the relative risks and benefits of all options for the treatment of severe aortic stenosis including long term medical therapy, conventional surgery for aortic valve replacement, and transcatheter aortic valve replacement.  The patient has been independently evaluated by two cardiac surgeons including Dr. Roxy Manns and Dr. Cyndia Bent, and they are felt to be at moderate risk for conventional surgical aortic valve replacement based upon a predicted risk of mortality using the Society of Thor[employer] Surgeons risk calculator of 6.5%. Based upon review of all of the patient's preoperative diagnostic tests they are felt to be candidate for transcatheter aortic valve replacement using the transfemoral approach as an alternative to high risk conventional surgery.    Following the decision to proceed with transcatheter aortic valve replacement, a discussion has been held regarding what types of management strategies would be attempted intraoperatively in the event of life-threatening complications, including whether or not the patient would be considered a candidate for the use of cardiopulmonary bypass and/or conversion to open sternotomy for attempted surgical intervention.  The patient has been advised of a variety of complications that might develop peculiar to this approach including but not limited to risks of death, stroke, paravalvular leak, aortic dissection or other major vascular complications, aortic annulus rupture, device embolization, cardiac rupture or perforation, acute myocardial infarction, arrhythmia, heart block or bradycardia requiring permanent pacemaker placement, congestive heart failure, respiratory failure, renal failure, pneumonia, infection, other late complications related to structural valve deterioration or migration, or other complications that might ultimately cause a temporary or permanent  loss of functional independence or other long term morbidity.  The patient provides full informed consent for the procedure as described and all  questions were answered preoperatively.  DETAILS OF THE OPERATIVE PROCEDURE  PREPARATION:   The patient is brought to the operating room on the above mentioned date and central monitoring was established by the anesthesia team including placement of a central venous catheter and radial arterial line. The patient is placed in the supine position on the operating table.  Intravenous antibiotics are administered. The patient is monitored closely throughout the procedure under conscious sedation.  Baseline transthoracic echocardiogram is performed. The patient's chest, abdomen, both groins, and both lower extremities are prepared and draped in a sterile manner. A time out procedure is performed.   PERIPHERAL ACCESS:   Using ultrasound guidance, femoral arterial and venous access is obtained with placement of 6 Fr sheaths on the left side.  A pigtail diagnostic catheter was passed through the femoral arterial sheath under fluoroscopic guidance into the aortic root.  A temporary transvenous pacemaker catheter was passed through the femoral venous sheath under fluoroscopic guidance into the right ventricle.  The pacemaker was tested to ensure stable lead placement and pacemaker capture. Aortic root angiography was performed in order to determine the optimal angiographic angle for valve deployment.  TRANSFEMORAL ACCESS:  A micropuncture technique is used to access the right femoral artery under fluoroscopic and ultrasound guidance.  Images are captured and stored in the patient's chart. 2 Perclose devices are deployed at 10' and 2' positions to 'PreClose' the femoral artery. An 8 French sheath is placed and then an Amplatz Superstiff wire is advanced through the sheath. This is changed out for a 14 French transfemoral E-Sheath after progressively dilating over the  Superstiff wire.  An AL-1 catheter was used to direct a straight-tip exchange length wire across the native aortic valve into the left ventricle. This was exchanged out for a pigtail catheter and position was confirmed in the LV apex. Simultaneous LV and Ao pressures were recorded.  The pigtail catheter was exchanged for an Amplatz Extra-stiff wire in the LV apex.  Echocardiography was utilized to confirm appropriate wire position and no sign of entanglement in the mitral subvalvular apparatus.  TRANSCATHETER HEART VALVE DEPLOYMENT:  An Edwards Sapien 3 transcatheter heart valve (size 26 mm, model #9600TFX, serial #[id number]) was prepared and crimped per manufacturer's guidelines, and the proper orientation of the valve is confirmed on the [employer] delivery system. The valve was advanced through the introducer sheath using normal technique until in an appropriate position in the abdominal aorta beyond the sheath tip. The balloon was then retracted and using the fine-tuning wheel was centered on the valve. The valve was then advanced across the aortic arch using appropriate flexion of the catheter. The valve was carefully positioned across the aortic valve annulus. The Commander catheter was retracted using normal technique. Once final position of the valve has been confirmed by angiographic assessment, the valve is deployed while temporarily holding ventilation and during rapid ventricular pacing to maintain systolic blood pressure < 50 mmHg and pulse pressure < 10 mmHg. The balloon inflation is held for >3 seconds after reaching full deployment volume. Once the balloon has fully deflated the balloon is retracted into the ascending aorta and valve function is assessed using echocardiography. There is felt to be no paravalvular leak and no central aortic insufficiency.  The patient's hemodynamic recovery following valve deployment is good.  The deployment balloon and guidewire are both removed. Echo  demostrated acceptable post-procedural gradients, stable mitral valve function, and no aortic insufficiency.   PROCEDURE COMPLETION:  The sheath was removed  and femoral artery closure is performed using the 2 previously deployed Perclose devices.  Protamine is administered once femoral arterial repair was complete. The site is clear with no evidence of bleeding or hematoma after the sutures are tightened. The temporary pacemaker, pigtail catheters and femoral sheaths were removed with manual pressure used for hemostasis.   The patient tolerated the procedure well and is transported to the surgical intensive care in stable condition. There were no immediate intraoperative complications. All sponge instrument and needle counts are verified correct at completion of the operation.   The patient received a total of 40.4 mL of intravenous contrast during the procedure.   Sherren Mocha, MD 05/12/2017 10:04 AM

## 2017-05-12 NOTE — Interval H&P Note (Signed)
  [facility]   MULTIDISCIPLINARY HEART VALVE TEAM   History and Physical Interval Note:  05/12/2017, 9:11 AM   Theresa Cain has presented today for surgery, with the diagnosis of severe aortic stenosis  The various methods of treatment have previously been discussed with the patient and family. After consideration of risks, benefits and other options for treatment, the patient has consented to  Procedure(s): Procedure(s): TRANSCATHETER AORTIC VALVE REPLACEMENT, TRANSFEMORAL (N/A) TRANSESOPHAGEAL ECHOCARDIOGRAM (TEE) (N/A) as a surgical intervention.   The patient reports the following:   Shortness of breath: Yes.   If yes: with what activity?:  Worse than previously noted?: Yes.    New edema, PND, orthopnea: Yes.    Recent decrease in activity or worsening fatigue i.e. more difficulty walking to mailbox, climbing stairs, etc: Yes.    Changes since last seen in pre-op visit: No.      Component Value Date/Time   BNP 218.8 (H) 05/08/2017 1057    The patient's history has been reviewed, patient briefly examined, no change in status (or as listed above), stable for surgery.  I have reviewed the patient's chart and labs. Questions were answered to the patient's satisfaction.    Pt seen in pre-op area with her daughter present. She overall reports no major changes, but continues to have DOE. She does admit to worsening leg swelling over the past few days and BNP is mildly elevated.  Overall symptoms c/w acute on chronic diastolic CHF related to severe aortic stenosis.    Sherren Mocha MD

## 2017-05-12 NOTE — Anesthesia Procedure Notes (Signed)
Central Venous Catheter Insertion Performed by: Roderic Palau, MD, anesthesiologist Start/End2/19/2019 9:10 AM, 05/12/2017 9:25 AM Patient location: Pre-op. Preanesthetic checklist: patient identified, IV checked, site marked, risks and benefits discussed, surgical consent, monitors and equipment checked, pre-op evaluation, timeout performed and anesthesia consent Position: Trendelenburg Lidocaine 1% used for infiltration and patient sedated Hand hygiene performed , maximum sterile barriers used  and Seldinger technique used Catheter size: 8 Fr Total catheter length 16. Central line was placed.Double lumen Procedure performed using ultrasound guided technique. Ultrasound Notes:anatomy identified, needle tip was noted to be adjacent to the nerve/plexus identified, no ultrasound evidence of intravascular and/or intraneural injection and image(s) printed for medical record Attempts: 1 Following insertion, dressing applied, line sutured and Biopatch. Post procedure assessment: blood return through all ports  Patient tolerated the procedure well with no immediate complications.

## 2017-05-12 NOTE — Plan of Care (Signed)
  Progressing Pain Managment: General experience of comfort will improve 05/12/2017 1252 - Progressing by Netta Corrigan, RN Note Pt is not experiencing any pain at this time.  Cardiovascular: Vascular access site(s) Level 0-1 will be maintained 05/12/2017 1252 - Progressing by Netta Corrigan, RN Note Both femoral sites are clean, dry, intact and a level zero.

## 2017-05-12 NOTE — Plan of Care (Signed)
Left venous and arterial sheaths removed per protocal, pressure held x 20 minutes, patients nurse at bedside to assess site. Instructions given to patient about keeping leg still and straight and to hold site if she coughs. Tolerated procedure well

## 2017-05-12 NOTE — Anesthesia Postprocedure Evaluation (Signed)
Anesthesia Post Note  Patient: Raevyn Sokol  Procedure(s) Performed: TRANSCATHETER AORTIC VALVE REPLACEMENT, TRANSFEMORAL using a 27m Edwards Sapien 3 Aortic Valve (N/A Chest) TRANSESOPHAGEAL ECHOCARDIOGRAM (TEE) (N/A Chest)     Patient location during evaluation: ICU Anesthesia Type: MAC Level of consciousness: awake and alert Pain management: pain level controlled Vital Signs Assessment: post-procedure vital signs reviewed and stable Respiratory status: spontaneous breathing, nonlabored ventilation, respiratory function stable and patient connected to nasal cannula oxygen Cardiovascular status: stable and blood pressure returned to baseline Postop Assessment: no apparent nausea or vomiting Anesthetic complications: no    Last Vitals:  Vitals:   05/12/17 1315 05/12/17 1400  BP: (!) 147/61 (!) 121/56  Pulse: (!) 59 (!) 59  Resp: 19 16  Temp:    SpO2: 90% 100%    Last Pain:  Vitals:   05/12/17 1215  TempSrc: Oral                 Shonte Soderlund,W. EDMOND

## 2017-05-12 NOTE — Op Note (Signed)
[facility]   MULTIDISCIPLINARY HEART VALVE TEAM   TAVR OPERATIVE NOTE   Date of Procedure:  05/12/2017  Preoperative Diagnosis: Severe Aortic Stenosis   Postoperative Diagnosis: Same   Procedure:    Transcatheter Aortic Valve Replacement - Percutaneous Right Transfemoral Approach  Edwards Sapien 3 THV (size 23 mm, model # 9600TFX, serial # [id number])   Co-Surgeons:  Sherren Mocha, MD and Valentina Gu. Roxy Manns, MD   Anesthesiologist:  Roderic Palau, MD  Echocardiographer:  Jenkins Rouge, MD  Pre-operative Echo Findings:  Severe aortic stenosis  Normal left ventricular systolic function  Post-operative Echo Findings:  Trivial paravalvular leak  Normal left ventricular systolic function   BRIEF CLINICAL NOTE AND INDICATIONS FOR SURGERY  Patient is an 84 year old female with history of aortic stenosis, long-standing persistent atrial fibrillation on long-term anticoagulation, chronic diastolic congestive heart failure, tachybradycardia syndrome status post permanent pacemaker placement, recent hospitalization for torsades with ventricular tachycardia, carotid artery disease with remote right carotid endarterectomy, hypertension, chronic kidney disease, and 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  The patient has been told that she had a heart murmur for many years.  The patient's cardiac history otherwise dates back to 2014 when she underwent Whipple pancreaticoduodenectomy.  Her hospitalization was prolonged and immediately following hospital discharge she suffered a "light" heart attack.  At the time she was treated at [facility]  She was followed for a period of time by Dr. Clayborn Bigness in the [facility].  In 2017 she developed dizzy spells and a Holter monitor  revealed findings consistent with symptomatic bradycardia with tachybradycardia syndrome.  She underwent permanent pacemaker placement in December 2017 with subsequent revision of the pacemaker secondary to lead dislodgment.  She did well until recently when she was hospitalized at [facility] with generalized weakness, diaphoresis, chest discomfort, and shortness of breath.  Telemetry revealed sustained ventricular tachycardia with torsades, frequent PVCs, and runs of nonsustained VT.  She was noted to have severely depleted potassium level.  Her pacemaker was reprogrammed and potassium replaced.  Diagnostic cardiac catheterization was performed revealed multivessel coronary artery disease with 40% stenosis of the left anterior descending coronary artery, high-grade stenosis of a diagonal branch, and 75% stenosis of the distal right coronary artery.  The aortic valve could not be crossed.  Right heart catheterization was not performed.  Echocardiogram revealed normal left ventricular systolic function with severe aortic stenosis.  Peak velocity across the aortic valve range between 4.1 and 5 m/s corresponding to mean transvalvular gradient estimated 49 mmHg.  She had mild to moderate mitral regurgitation, severe left atrial enlargement, and moderate tricuspid regurgitation with moderate pulmonary hypertension.  Her coronary artery disease was treated medically and she was referred to Dr. Burt Knack who saw her in consultation on April 01, 2017.  The patient has subsequently undergone CT angiography and been referred for surgical consultation.  During the course of the patient's preoperative work up they have been evaluated comprehensively by a multidisciplinary team of specialists coordinated through the [facility] in the [place] [facility].  They have been demonstrated to suffer from symptomatic severe aortic stenosis as noted above. The patient has been counseled extensively  as to the relative risks and benefits of all options for the treatment of severe aortic stenosis including long term medical therapy, conventional surgery  for aortic valve replacement, and transcatheter aortic valve replacement.  All questions have been answered, and the patient provides full informed consent for the operation as described.   DETAILS OF THE OPERATIVE PROCEDURE  PREPARATION:    The patient is brought to the operating room on the above mentioned date and central monitoring was established by the anesthesia team including placement of a central venous line and radial arterial line. The patient is placed in the supine position on the operating table.  Intravenous antibiotics are administered. The patient is monitored closely throughout the procedure under conscious sedation.    Baseline transthoracic echocardiogram was performed. The patient's chest, abdomen, both groins, and both lower extremities are prepared and draped in a sterile manner. A time out procedure is performed.   PERIPHERAL ACCESS:    Using the modified Seldinger technique, femoral arterial and venous access was obtained with placement of 6 Fr sheaths on the left side.  A pigtail diagnostic catheter was passed through the left arterial sheath under fluoroscopic guidance into the aortic root.  A temporary transvenous pacemaker catheter was passed through the left femoral venous sheath under fluoroscopic guidance into the right ventricle.  The pacemaker was tested to ensure stable lead placement and pacemaker capture. Aortic root angiography was performed in order to determine the optimal angiographic angle for valve deployment.   TRANSFEMORAL ACCESS:   Percutaneous transfemoral access and sheath placement was performed by Dr. Burt Knack using ultrasound guidance.  The right common femoral artery was cannulated using a micropuncture needle and appropriate location was verified using hand injection angiogram.  A pair of  [employer] Perclose percutaneous closure devices were placed and a 6 French sheath replaced into the femoral artery.  The patient was heparinized systemically and ACT verified > 250 seconds.    A 14 Fr transfemoral E-sheath was introduced into the right common femoral artery after progressively dilating over an Amplatz superstiff wire. An AL-2 catheter was used to direct a straight-tip exchange length wire across the native aortic valve into the left ventricle. This was exchanged out for a pigtail catheter and position was confirmed in the LV apex. Simultaneous LV and Ao pressures were recorded.  The pigtail catheter was exchanged for an Amplatz Extra-stiff wire in the LV apex.  Echocardiography was utilized to confirm appropriate wire position and no sign of entanglement in the mitral subvalvular apparatus.   TRANSCATHETER HEART VALVE DEPLOYMENT:   An Edwards Sapien 3 transcatheter heart valve (size 23 mm, model #9600TFX, serial #[id number]) was prepared and crimped per manufacturer's guidelines, and the proper orientation of the valve is confirmed on the [employer] delivery system. The valve was advanced through the introducer sheath using normal technique until in an appropriate position in the abdominal aorta beyond the sheath tip. The balloon was then retracted and using the fine-tuning wheel was centered on the valve. The valve was then advanced across the aortic arch using appropriate flexion of the catheter. The valve was carefully positioned across the aortic valve annulus. The Commander catheter was retracted using normal technique. Once final position of the valve has been confirmed by angiographic assessment, the valve is deployed while temporarily holding ventilation and during rapid ventricular pacing to maintain systolic blood pressure < 50 mmHg and pulse pressure < 10 mmHg. The balloon inflation is held for >3 seconds after reaching full deployment volume. Once the balloon has fully deflated  the balloon is retracted into the ascending aorta and valve function is assessed using echocardiography. There is felt  to be trivial paravalvular leak and no central aortic insufficiency.  The patient's hemodynamic recovery following valve deployment is good.  The deployment balloon and guidewire are both removed.    PROCEDURE COMPLETION:   The sheath was removed and femoral artery closure performed by Dr Burt Knack.  Protamine was administered once femoral arterial repair was complete. The temporary pacemaker, pigtail catheters and femoral sheaths were removed with manual pressure used for hemostasis.   The patient tolerated the procedure well and is transported to the surgical intensive care in stable condition. There were no immediate intraoperative complications. All sponge instrument and needle counts are verified correct at completion of the operation.   No blood products were administered during the operation.  The patient received a total of 40.4 mL of intravenous contrast during the procedure.   Rexene Alberts, MD 05/12/2017 11:43 AM

## 2017-05-13 ENCOUNTER — Encounter (HOSPITAL_COMMUNITY): Payer: Self-pay | Admitting: Cardiovascular Disease

## 2017-05-13 ENCOUNTER — Other Ambulatory Visit: Payer: Self-pay

## 2017-05-13 ENCOUNTER — Inpatient Hospital Stay (HOSPITAL_COMMUNITY): Payer: Medicare Other

## 2017-05-13 DIAGNOSIS — Z952 Presence of prosthetic heart valve: Secondary | ICD-10-CM

## 2017-05-13 DIAGNOSIS — I350 Nonrheumatic aortic (valve) stenosis: Secondary | ICD-10-CM

## 2017-05-13 DIAGNOSIS — I351 Nonrheumatic aortic (valve) insufficiency: Secondary | ICD-10-CM

## 2017-05-13 LAB — BASIC METABOLIC PANEL
Anion gap: 8 (ref 5–15)
BUN: 13 mg/dL (ref 6–20)
CALCIUM: 8.2 mg/dL — AB (ref 8.9–10.3)
CHLORIDE: 105 mmol/L (ref 101–111)
CO2: 27 mmol/L (ref 22–32)
CREATININE: 1.29 mg/dL — AB (ref 0.44–1.00)
GFR calc Af Amer: 42 mL/min — ABNORMAL LOW (ref >60)
GFR, EST NON AFRICAN AMERICAN: 36 mL/min — AB (ref >60)
Glucose, Bld: 104 mg/dL — ABNORMAL HIGH (ref 65–99)
Potassium: 4.2 mmol/L (ref 3.5–5.1)
SODIUM: 140 mmol/L (ref 135–145)

## 2017-05-13 LAB — POCT I-STAT 3, ART BLOOD GAS (G3+)
Acid-Base Excess: 1 mmol/L (ref 0.0–2.0)
Bicarbonate: 25.8 mmol/L (ref 20.0–28.0)
O2 SAT: 99 %
PCO2 ART: 39.1 mmHg (ref 32.0–48.0)
PH ART: 7.425 (ref 7.350–7.450)
Patient temperature: 97.5
TCO2: 27 mmol/L (ref 22–32)
pO2, Arterial: 110 mmHg — ABNORMAL HIGH (ref 83.0–108.0)

## 2017-05-13 LAB — ECHOCARDIOGRAM COMPLETE
Height: 72 in
Weight: 3382.74 oz

## 2017-05-13 LAB — CBC
HCT: 31 % — ABNORMAL LOW (ref 36.0–46.0)
HEMOGLOBIN: 9.9 g/dL — AB (ref 12.0–15.0)
MCH: 29.6 pg (ref 26.0–34.0)
MCHC: 31.9 g/dL (ref 30.0–36.0)
MCV: 92.8 fL (ref 78.0–100.0)
PLATELETS: 106 10*3/uL — AB (ref 150–400)
RBC: 3.34 MIL/uL — ABNORMAL LOW (ref 3.87–5.11)
RDW: 14.9 % (ref 11.5–15.5)
WBC: 4.5 10*3/uL (ref 4.0–10.5)

## 2017-05-13 LAB — POCT I-STAT 4, (NA,K, GLUC, HGB,HCT)
GLUCOSE: 120 mg/dL — AB (ref 65–99)
HEMATOCRIT: 28 % — AB (ref 36.0–46.0)
Hemoglobin: 9.5 g/dL — ABNORMAL LOW (ref 12.0–15.0)
Potassium: 4 mmol/L (ref 3.5–5.1)
SODIUM: 142 mmol/L (ref 135–145)

## 2017-05-13 LAB — MAGNESIUM: Magnesium: 1.9 mg/dL (ref 1.7–2.4)

## 2017-05-13 MED ORDER — TORSEMIDE 20 MG PO TABS
20.0000 mg | ORAL_TABLET | Freq: Every day | ORAL | Status: DC
  Administered 2017-05-13: 20 mg via ORAL
  Filled 2017-05-13: qty 1

## 2017-05-13 MED ORDER — PERFLUTREN LIPID MICROSPHERE
INTRAVENOUS | Status: AC
  Filled 2017-05-13: qty 10

## 2017-05-13 MED ORDER — ASPIRIN 81 MG PO TBEC: 81.0000 mg | DELAYED_RELEASE_TABLET | Freq: Every day | ORAL | Status: DC

## 2017-05-13 MED ORDER — APIXABAN 5 MG PO TABS: 5.0000 mg | ORAL_TABLET | Freq: Two times a day (BID) | ORAL | 6 refills | Status: DC

## 2017-05-13 MED ORDER — POTASSIUM CHLORIDE ER 10 MEQ PO TBCR
10.0000 meq | EXTENDED_RELEASE_TABLET | Freq: Two times a day (BID) | ORAL | Status: DC
  Administered 2017-05-13: 10 meq via ORAL
  Filled 2017-05-13: qty 1

## 2017-05-13 MED ORDER — DILTIAZEM HCL ER COATED BEADS 180 MG PO CP24
180.0000 mg | ORAL_CAPSULE | Freq: Every day | ORAL | Status: DC
  Administered 2017-05-13: 180 mg via ORAL
  Filled 2017-05-13: qty 1

## 2017-05-13 MED FILL — Heparin Sodium (Porcine) Inj 1000 Unit/ML: INTRAMUSCULAR | Qty: 30 | Status: AC

## 2017-05-13 MED FILL — Magnesium Sulfate Inj 50%: INTRAMUSCULAR | Qty: 10 | Status: AC

## 2017-05-13 MED FILL — Sodium Chloride IV Soln 0.9%: INTRAVENOUS | Qty: 250 | Status: AC

## 2017-05-13 MED FILL — Phenylephrine HCl Inj 10 MG/ML: INTRAMUSCULAR | Qty: 2 | Status: AC

## 2017-05-13 MED FILL — Potassium Chloride Inj 2 mEq/ML: INTRAVENOUS | Qty: 40 | Status: AC

## 2017-05-13 NOTE — Discharge Summary (Addendum)
[facility] VALVE TEAM   Discharge Summary    Patient ID: Theresa Cain,  MRN: [id number], DOB/AGE: 1927-02-13 84 y.o.  Admit date: 05/12/2017 Discharge date: 05/13/2017  Primary Care Provider: [facility] Primary Cardiologist: Dr. Rockey Situ Dr. Curt Bears (EP) / Dr. Burt Knack & Dr. Roxy Manns (TAVR)   Discharge Diagnoses    Principal Problem:   S/P TAVR (transcatheter aortic valve replacement) Active Problems:   Acute on chronic diastolic CHF (congestive heart failure) (HCC)   HTN (hypertension)   Atrial fibrillation, chronic (HCC)   GERD (gastroesophageal reflux disease)   Severe aortic stenosis   Coronary artery disease involving native coronary artery of native heart   Chronic kidney disease   Allergies Allergies  Allergen Reactions  . Penicillins Hives and Other (See Comments)    PATIENT HAS HAD A PCN REACTION WITH IMMEDIATE RASH, FACIAL/TONGUE/THROAT SWELLING, SOB, OR LIGHTHEADEDNESS WITH HYPOTENSION:  #  #  #  YES  #  #  #   Has patient had a PCN reaction causing severe rash involving mucus membranes or skin necrosis: No Has patient had a PCN reaction that required hospitalization: No Has patient had a PCN reaction occurring within the last 10 years: No If all of the above answers are "NO", then may proceed with Cephalosporin use.   . Sulfa Antibiotics Hives     History of Present Illness     Theresa Cain is a 84 y.o. 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  Her cardiac history dates back to 2014 when she underwent a Whipple procedure at [facility].  She had a prolonged hospitalization and soon after discharge suffered an MI.  In 2017 she developed  dizzy spells and a Holter monitor showed tachy-brady syndrome.  She underwent insertion of a permanent pacemaker in December 2017 with subsequent revision due to lead dislodgment.  She did well until she was recently admitted to [facility] 03/23/17-03/27/17 with shortness of breath, diaphoresis, and chest discomfort.  She was found to be in ventricular tachycardia with torsades with frequent PVCs and runs of nonsustained ventricular tachycardia.  She was noted to have a very low potassium level which was replaced.  She underwent cardiac catheterization which showed multivessel coronary disease with 40% stenosis of the LAD, high-grade stenosis of a diagonal branch, and 75% stenosis of the distal right coronary artery.  The aortic valve could not be crossed.  An echocardiogram at that time showed severe aortic stenosis with a mean transvalvular gradient of 49 mmHg.  Left ventricular systolic function was normal.  There was mild to moderate mitral regurgitation and moderate tricuspid regurgitation with moderate pulmonary hypertension.  Her coronary disease was treated medically and she was seen by the multidisciplinary valve team for consideration of TAVR to treat her severe aortic stenosis.    She was felt to be a suitable candidate for TAVR and this was set up for 05/12/17.  Hospital Course     Consultants: none  Severe AS: s/p successful TAVR with a 40mm Edwards Sapien 3 THV via the TF approach on 05/12/17. Post operative echo shows normal LV systolic function, normal function of her transcatheter aortic valve with a mean gradient of 9 mmHg and trivial paravalvular regurgitation. Groin sites are stable.Continue ASA 81mg  daily. We will resume home Eliquis tomorrow (  05/14/17). DC home today. She has a TOC appointment arranged for next week.   Permanent atrial fibrillation: she was resumed on home Cardizem CD 180mg  and Eliquis 5mg  BID.   HTN: BP well controlled.   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 She was bridged with Lovenox preoperatively. We will plan to resume home Eliquis tomorrow.    Acute on chronic diastolic CHF: as evidenced by mildly elevated BNP on pre admission labs and pulmonary vascular congestion on CXR. This has been treated by TAVR. She was resumed on home Torsemide.   S/p PPM: followed by Dr. Curt Bears  Hx of Torsades: in the setting of hypokalemia. No recurrence this admission  The patient has had an uncomplicated hospital course and is recovering well. The femoral catheter sites are stable. She has been seen by Dr. Burt Knack today and deemed ready for discharge home. All follow-up appointments have been scheduled. Discharge medications are listed below.  _____________  Discharge Vitals Blood pressure 101/65, pulse 61, temperature 98 F (36.7 C), temperature source Oral, resp. rate 14, height 6' (1.829 m), weight 211 lb 6.7 oz (95.9 kg), SpO2 97 %.  Filed Weights   05/12/17 0814 05/13/17 0500  Weight: 206 lb (93.4 kg) 211 lb 6.7 oz (95.9 kg)    Labs & Radiologic Studies     CBC Recent Labs    05/12/17 1202 05/13/17 0337  WBC 5.2 4.5  HGB 10.2* 9.9*  HCT 32.0* 31.0*  MCV 90.7 92.8  PLT 119* 875*   Basic Metabolic Panel Recent Labs    05/12/17 1137 05/12/17 1159 05/13/17 0337  NA 140 142 140  K 4.1 4.0 4.2  CL 102  --  105  CO2  --   --  27  GLUCOSE 130* 120* 104*  BUN 15  --  13  CREATININE 1.30*  --  1.29*  CALCIUM  --   --  8.2*  MG  --   --  1.9   Liver Function Tests No results for input(s): AST, ALT, ALKPHOS, BILITOT, PROT, ALBUMIN in the last 72 hours. No results for input(s): LIPASE, AMYLASE in the last 72 hours. Cardiac Enzymes No results for input(s): CKTOTAL, CKMB, CKMBINDEX, TROPONINI in the last 72 hours. BNP Invalid input(s): POCBNP D-Dimer No results for input(s): DDIMER in the last 72 hours. Hemoglobin A1C No results for input(s): HGBA1C in the last 72 hours. Fasting Lipid Panel No  results for input(s): CHOL, HDL, LDLCALC, TRIG, CHOLHDL, LDLDIRECT in the last 72 hours. Thyroid Function Tests No results for input(s): TSH, T4TOTAL, T3FREE, THYROIDAB in the last 72 hours.  Invalid input(s): FREET3  Dg Chest 2 View  Result Date: 05/08/2017 CLINICAL DATA:  Severe aortic stenosis. Preoperative study prior to TAVR. EXAM: CHEST  2 VIEW COMPARISON:  Chest x-ray dated March 23, 2017. FINDINGS: Unchanged left chest wall AICD. Stable cardiomegaly. Pulmonary vascular congestion is unchanged. Mild interstitial pulmonary edema again noted. No focal consolidation, pleural effusion, or pneumothorax. No acute osseous abnormality. IMPRESSION: Stable cardiomegaly with mild vascular congestion and interstitial edema. Electronically Signed   By: Titus Dubin M.D.   On: 05/08/2017 13:44   Dg Chest Port 1 View  Result Date: 05/12/2017 CLINICAL DATA:  Status post TAVR EXAM: PORTABLE CHEST 1 VIEW COMPARISON:  Four days ago FINDINGS: Stable cardiopericardial size when allowing for rotation. There is a newly seen transcatheter aortic valve replacement. Dual-chamber pacer from the left. Visualized leads are in stable position. Right IJ  central line with tip at the upper SVC. There is interstitial coarsening that is mildly increased from prior. Few Kerley lines are likely present. No visible effusion or pneumothorax. IMPRESSION: 1. Vascular congestion. 2. Right IJ line in good position. Electronically Signed   By: Monte Fantasia M.D.   On: 05/12/2017 12:34     Diagnostic Studies/Procedures    TAVR OPERATIVE NOTE   Date of Procedure:05/12/2017  Preoperative Diagnosis:Severe Aortic Stenosis   Postoperative Diagnosis:Same   Procedure:   Transcatheter Aortic Valve Replacement - Percutaneous Transfemoral Approach Edwards Sapien 3 THV (size 76mm, model # 9600TFX, serial #  [id number]  Co-Surgeons:Clarence H. Roxy Manns, MD  and Sherren Mocha, MD  Pre-operative Echo Findings: ? Severe aortic stenosis ? Normalleft ventricular systolic function  Post-operative Echo Findings: ? Noparavalvular leak ? normalleft ventricular systolic function  __________________  Post operative echo 05/13/17: Study Conclusions - Procedure narrative: Post TAVR procedure using 12mm Edward Sapien   3 THV aortic valve. - Left ventricle: The cavity size was normal. Systolic function was   vigorous. The estimated ejection fraction was in the range of 65%   to 70%. Wall motion was normal; there were no regional wall   motion abnormalities. - Aortic valve: A TAVR 66mm bioprosthesis was present and   functioning normally. There was no regurgitation. Peak velocity   (S): 221 cm/s. Mean gradient (S): 10 mm Hg. - Aorta: Ascending aortic diameter: 40 mm (S). - Ascending aorta: The ascending aorta was mildly dilated. - Mitral valve: Calcified annulus. - Tricuspid valve: There was trivial regurgitation. - Pulmonary arteries: Systolic pressure was moderately increased.   PA peak pressure: 58 mm Hg (S).  Disposition   Pt is being discharged home today in good condition.  Follow-up Plans & Appointments    Follow-up Information    [facility], [place], [place]. Go on 05/21/2017.   Specialty:  Cardiology Why:  @ 9am Contact information: [place] [address] [phone]          Discharge Instructions    Diet - low sodium heart healthy   Complete by:  As directed    Increase activity slowly   Complete by:  As directed       Discharge Medications     Medication List    STOP taking these medications   enoxaparin 80 MG/0.8ML injection Commonly known as:  LOVENOX     TAKE these medications   acetaminophen 500 MG tablet Commonly known as:  TYLENOL Take 1,000 mg by mouth every 6 (six) hours as needed for moderate pain.   apixaban 5 MG Tabs tablet Commonly known as:  ELIQUIS Take 1  tablet (5 mg total) by mouth 2 (two) times daily. Start taking on:  05/14/2017   aspirin 81 MG EC tablet Take 1 tablet (81 mg total) by mouth daily. Start taking on:  05/14/2017   diltiazem 180 MG 24 hr capsule Commonly known as:  CARDIZEM CD Take 1 capsule (180 mg total) by mouth daily.   escitalopram 10 MG tablet Commonly known as:  LEXAPRO Take 10 mg by mouth at bedtime.   ferrous sulfate 325 (65 FE) MG tablet Take 325 mg by mouth daily with breakfast.   gabapentin 600 MG tablet Commonly known as:  NEURONTIN Take 600 mg by mouth 2 (two) times daily.   levothyroxine 50 MCG tablet Commonly known as:  SYNTHROID Take 1 tablet (50 mcg total) by mouth daily.   omeprazole 20 MG capsule Commonly known as:  PRILOSEC Take 20 mg by mouth daily.   potassium chloride 10 MEQ tablet Commonly known as:  K-DUR Take 1 tablet (10 mEq total) by mouth 2 (two) times daily.   pravastatin 40 MG tablet Commonly known as:  PRAVACHOL Take 40 mg by mouth every evening.   torsemide 20 MG tablet Commonly known as:  DEMADEX Take 1 tablet (20 mg total) by mouth daily. Take one additional tablet daily as needed for swelling.   Vitamin D (Ergocalciferol) 50000 units Caps capsule Commonly known as:  DRISDOL Take 50,000 Units by mouth 2 (two) times a week. Take on Sunday and Thursday         Outstanding Labs/Studies   None  Duration of Discharge Encounter   Greater than 30 minutes including physician time.  Signed, Angelena Form PA-C 05/13/2017, 2:48 PM

## 2017-05-13 NOTE — Progress Notes (Signed)
CARDIAC REHAB PHASE I   PRE:  Rate/Rhythm: 60 paicng    BP: sitting 116/50    SaO2: 98 RA  MODE:  Ambulation: 370 ft   POST:  Rate/Rhythm: 97 pacing    BP: sitting 163/60     SaO2: 97 RA  Pt eager to walk and go home. Able to get out of bed independently. Legs weak without RW so used it for walking around the unit. Steady with RW. She has one at home and I encouraged her to use it for a few days. She normally holds to furniture or uses her cane. Discussed restrictions, walking daily, and CRPII. She declines CRPII.  [id number]   [place], ACSM 05/13/2017 1:58 PM

## 2017-05-13 NOTE — Progress Notes (Signed)
[facility] VALVE TEAM  Patient Name: Theresa Cain Date of Encounter: 05/13/2017  Primary Cardiologist: Dr. Rockey Situ Dr. Curt Bears (EP) / Dr. Burt Knack & Dr. Roxy Manns (TAVR)  Hospital Problem List     Principal Problem:   S/P TAVR (transcatheter aortic valve replacement) Active Problems:   Acute on chronic diastolic CHF (congestive heart failure) (HCC)   HTN (hypertension)   Atrial fibrillation, chronic (HCC)   GERD (gastroesophageal reflux disease)   Severe aortic stenosis   Coronary artery disease involving native coronary artery of native heart   Chronic kidney disease     Subjective   Feeling well. No complaints. Getting echo.   Inpatient Medications    Scheduled Meds: . acetaminophen  1,000 mg Oral Q6H   Or  . acetaminophen (TYLENOL) oral liquid 160 mg/5 mL  1,000 mg Per Tube Q6H  . aspirin EC  81 mg Oral Daily   Or  . aspirin  81 mg Per Tube Daily  . Chlorhexidine Gluconate Cloth  6 each Topical Daily  . escitalopram  10 mg Oral QHS  . ferrous sulfate  325 mg Oral Q breakfast  . gabapentin  600 mg Oral BID  . levothyroxine  50 mcg Oral QAC breakfast  . mupirocin ointment   Nasal BID  . pantoprazole  40 mg Oral Daily  . pravastatin  40 mg Oral QPM  . sodium chloride flush  10-40 mL Intracatheter Q12H  . [START ON 05/14/2017] Vitamin D (Ergocalciferol)  50,000 Units Oral Once per day on Sun Thu   Continuous Infusions: . sodium chloride Stopped (05/12/17 2000)  . albumin human    . lactated ringers    . levofloxacin (LEVAQUIN) IV    . nitroGLYCERIN    . norepinephrine (LEVOPHED) Adult infusion Stopped (05/12/17 1416)   PRN Meds: albumin human, lactated ringers, metoprolol tartrate, midazolam, morphine injection, ondansetron (ZOFRAN) IV, oxyCODONE, sodium chloride flush, traMADol   Vital Signs    Vitals:   05/13/17 0500 05/13/17 0600 05/13/17 0700 05/13/17 0803  BP: (!) 119/49 (!) 113/48 92/63   Pulse: 61 62 61   Resp: 16  19 16    Temp:    97.6 F (36.4 C)  TempSrc:    Oral  SpO2: 100% 92% 96%   Weight: 211 lb 6.7 oz (95.9 kg)     Height: 6' (1.829 m)       Intake/Output Summary (Last 24 hours) at 05/13/2017 0938 Last data filed at 05/13/2017 0700 Gross per 24 hour  Intake 2337.5 ml  Output 1150 ml  Net 1187.5 ml   Filed Weights   05/12/17 0814 05/13/17 0500  Weight: 206 lb (93.4 kg) 211 lb 6.7 oz (95.9 kg)    Physical Exam   GEN: Well nourished, well developed, in no acute distress.  HEENT: Grossly normal.  Neck: Supple, no JVD, carotid bruits, or masses. Cardiac: RRR, no murmurs, rubs, or gallops. No clubbing, cyanosis, edema.  Radials/DP/PT 2+ and equal bilaterally.  Respiratory:  Respirations regular and unlabored, clear to auscultation bilaterally. GI: Soft, nontender, nondistended, BS + x 4. MS: no deformity or atrophy. Skin: warm and dry, no rash. Groin sites stable.  Neuro:  Strength and sensation are intact. Psych: AAOx3.  Normal affect.  Labs    CBC Recent Labs    05/12/17 1202 05/13/17 0337  WBC 5.2 4.5  HGB 10.2* 9.9*  HCT 32.0* 31.0*  MCV 90.7 92.8  PLT 119* 027*   Basic Metabolic Panel Recent Labs  05/12/17 1137 05/12/17 1159 05/13/17 0337  NA 140 142 140  K 4.1 4.0 4.2  CL 102  --  105  CO2  --   --  27  GLUCOSE 130* 120* 104*  BUN 15  --  13  CREATININE 1.30*  --  1.29*  CALCIUM  --   --  8.2*  MG  --   --  1.9   Liver Function Tests No results for input(s): AST, ALT, ALKPHOS, BILITOT, PROT, ALBUMIN in the last 72 hours. No results for input(s): LIPASE, AMYLASE in the last 72 hours. Cardiac Enzymes No results for input(s): CKTOTAL, CKMB, CKMBINDEX, TROPONINI in the last 72 hours. BNP Invalid input(s): POCBNP D-Dimer No results for input(s): DDIMER in the last 72 hours. Hemoglobin A1C No results for input(s): HGBA1C in the last 72 hours. Fasting Lipid Panel No results for input(s): CHOL, HDL, LDLCALC, TRIG, CHOLHDL, LDLDIRECT in the last 72  hours. Thyroid Function Tests No results for input(s): TSH, T4TOTAL, T3FREE, THYROIDAB in the last 72 hours.  Invalid input(s): FREET3  Telemetry    Paced  - Personally Reviewed  ECG    V paced HR 60 - Personally Reviewed  Radiology    Dg Chest Port 1 View  Result Date: 05/12/2017 CLINICAL DATA:  Status post TAVR EXAM: PORTABLE CHEST 1 VIEW COMPARISON:  Four days ago FINDINGS: Stable cardiopericardial size when allowing for rotation. There is a newly seen transcatheter aortic valve replacement. Dual-chamber pacer from the left. Visualized leads are in stable position. Right IJ central line with tip at the upper SVC. There is interstitial coarsening that is mildly increased from prior. Few Kerley lines are likely present. No visible effusion or pneumothorax. IMPRESSION: 1. Vascular congestion. 2. Right IJ line in good position. Electronically Signed   By: Monte Fantasia M.D.   On: 05/12/2017 12:34    Cardiac Studies   TAVR OPERATIVE NOTE   Date of Procedure:                05/12/2017  Preoperative Diagnosis:      Severe Aortic Stenosis   Postoperative Diagnosis:    Same   Procedure:        Transcatheter Aortic Valve Replacement - Percutaneous Transfemoral Approach             Edwards Sapien 3 THV (size 26 mm, model # 9600TFX, serial #  [id number]              Co-Surgeons:                        Valentina Gu. Roxy Manns, MD and Sherren Mocha, MD  Pre-operative Echo Findings: ? Severe aortic stenosis ? Normal left ventricular systolic function  Post-operative Echo Findings: ? No paravalvular leak ? normal left ventricular systolic function  __________________  Post operative echo 05/13/17: pending  Patient Profile     Theresa Cain is a 84 y.o. female with a history of permanent atrial fibrillation on Eliquis, tachy-brady s/p MDT PPM, caroid artery disease s/p remote R CEA, HTN, chronic diastolic CHF, CKD, neuroendocrine carcinoma of small bowel s/p resection  (07/2012), history of Torsades/VT, and severe AS who presented to [facility] on 05/12/17 for planned TAVR.   Assessment & Plan    Severe AS: s/p successful TAVR with a 26 mm Edwards Sapien 3 THV via the TF approach on 05/12/17. Post operative echo pending today. Groin sites are stable. Will remove central line and plan to transfer to  the floor or discharge home later today. Early ambulation. Continue ASA. We will resume home Eliquis tomrorow.   Permanent atrial fibrillation: I will resume home Cardizem CD 180mg  and resume home Eliquis 5mg  BID tomorrow AM.  HTN: BP well controlled.   Possible LAA thrombus: she had a possible LAA thrombus on pre TAVR cardaic CT. She was bridged with Lovenox preoperatively. We will plan to resume home Eliquis tomorrow.    Acute on chronic diastolic CHF: as evidenced by mildly elevated BNP on pre admission labs and pulmonary vascular congestion on CXR. This has been treated by TAVR. I have resumed home Torsemide.   S/p PPM: followed by Dr. Curt Bears  Hx of Torsades: in the setting of hypokalemia.   SignedAngelena Form, PA-C  05/13/2017, 9:38 AM  Pager [phone]  Patient seen, examined. Available data reviewed. Agree with findings, assessment, and plan as outlined by Nell Range, PA-C.   The patient is interviewed with her family at the bedside.  Lungs are clear.  JVP is normal.  Heart is regular rate and rhythm with a 2/6 systolic ejection murmur at the right upper sternal border.  There is no diastolic murmur.  Bilateral groin sites are clear.  There is no pretibial edema.  I have personally reviewed the patient's echo images which demonstrate normal LV systolic function, normal function of her transcatheter aortic valve with a mean gradient of 9 mmHg and trivial paravalvular regurgitation.  The patient is hemodynamically stable, her groin sites are clear, she has ambulated without symptoms, and is back on her home medications.  I think she is ready for  discharge as she has a good support system at home.  She is advised to restart apixaban tomorrow.  She should continue on aspirin 81 mg.  Her follow-up will be arranged as outlined above.  Post-TAVR restrictions are reviewed with the patient.  Sherren Mocha, M.D. 05/13/2017 12:24 PM

## 2017-05-13 NOTE — Discharge Instructions (Signed)

## 2017-05-13 NOTE — Progress Notes (Signed)
Patient given discharge summary. Medication regimen and follow up appointments reviewed with the patient and family. Patient and family have no further questions at this time. Will continue to monitor.  [address] [place]

## 2017-05-14 ENCOUNTER — Telehealth: Payer: Self-pay | Admitting: Physician Assistant

## 2017-05-14 NOTE — Telephone Encounter (Signed)
  [place] VALVE TEAM   Patient contacted regarding discharge from [facility] on 2/20   Patient understands to follow up with provider Gilberto Better PA-C on 2/28 @ 9am  at [place].  Patient understands discharge instructions? yes Patient understands medications and regiment? yes Patient understands to bring all medications to this visit? yes  Angelena Form PA-C  [facility]

## 2017-05-19 NOTE — Progress Notes (Signed)
Cardiology Office Note:    Date:  05/20/2017   ID:  Theresa Cain, DOB 06-Mar-1927, MRN [id number]  PCP:  Kendrick Ranch, MD  Cardiologist:  Sherren Mocha, MD  Electrophysiologist: Dr. Allegra Lai    Referring MD: [facility]*   Chief Complaint  Patient presents with  . Hospitalization Follow-up    Status post TAVR    History of Present Illness:    Theresa Cain is a 84 y.o. 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  She was admitted to [facility] in December 2018 with sustained ventricular tachycardia and torsade de pointes in the setting of hypokalemia (K 3).  Cardiac catheterization demonstrated moderate nonobstructive coronary artery disease.  Case was reviewed with EP and electrolyte repletion was recommended.  Echocardiogram demonstrated severe aortic stenosis.  She was evaluated by Dr. Burt Knack in January 2019 and TAVR was recommended.  He was admitted 2/19-2/20 and underwent successful TAVR via transfemoral approach.  She was discharged home on Eliquis and aspirin 81.  Follow-up with Dr. Burt Knack is 06/19/17.  [name] returns for post hospital follow-up.  She is here with her daughter.  Since discharge, she has been doing well.  She has been walking without difficulty.  She denies significant shortness of breath.  She denies chest discomfort.  She denies dizziness or syncope.  She denies PND.  She has chronic lower extremity edema.  She denies bleeding issues.  She does note fatigue that seems to be improving.  Prior CV studies:   The following studies were reviewed today:  Echo 05/13/17 EF 65-70, normal wall motion, status post TAVR with mean gradient 10, mildly dilated  ascending aorta (40 mm), MAC, PASP 58  Limited intraoperative echo 05/12/17 - Pre TAVR: severe AS with mild AR peak velocity 4.2 m/sec mean gradient 35 mmHg peak 71 mmHg AVA .65 cm2 - Post TAVR: good position of 23 mm Sapien 3 valve. No significant peri valvular regurgitation Peak velocity 1.8 m/sec mean gradient 6 mmHg peak 13 mmHg AVA 3.0 cm2.  Cardiac catheterization 03/27/17 LAD proximal 40, mid 40 RCA distal 75; R PAVB 40  Carotid US 04/07/17 Status post right CEA, RICA 6-27 LICA 0-35  Echo 0/0/93 Mild concentric LVH, EF 60-65, severe aortic stenosis (mean gradient 49), mild to moderate MR, moderate LAE, moderate TR, PASP 60  Past Medical History:  Diagnosis Date  . Acute on chronic diastolic CHF (congestive heart failure) ([place]) 02/22/2016  . Anemia   . Bradycardia   . Carotid artery occlusion   . Chronic diastolic CHF (congestive heart failure) ([place])   . Chronic kidney disease    elevated creatinne  . Coronary artery disease involving native coronary artery of native heart   . GERD (gastroesophageal reflux disease)   . HLD (hyperlipidemia)   . Hypertension   . Hypothyroidism   . Obesity   . Osteoarthritis   . Permanent atrial fibrillation ([place])   . Presence of permanent cardiac pacemaker   . PVD (peripheral vascular disease) ([place])   . 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 Severe aortic stenosis   . Stomach cancer ([place]) ~ 2013  . Vertigo     Past Surgical History:  Procedure Laterality  Date  . CARDIOVASCULAR STRESS TEST    . CARDIOVERSION N/A 08/08/2016   Procedure: CARDIOVERSION;  Surgeon: Sueanne Margarita, MD;  Location: [facility] ENDOSCOPY;  Service: Cardiovascular;  Laterality: N/A;  . CAROTID ENDARTERECTOMY Right ~ 2005  . CATARACT EXTRACTION W/ INTRAOCULAR LENS IMPLANT Right   . CHOLECYSTECTOMY  ~ 2013   "part of her [facility] OR"  . EP  IMPLANTABLE DEVICE N/A 03/04/2016   Procedure: Pacemaker Implant;  Surgeon: Will Meredith Leeds, MD;  Location: [place] CV LAB;  Service: Cardiovascular;  Laterality: N/A;  . EP IMPLANTABLE DEVICE N/A 03/05/2016   Procedure: Lead Revision/Repair;  Surgeon: Deboraha Sprang, MD;  Location: [place] CV LAB;  Service: Cardiovascular;  Laterality: N/A;  . INSERT / REPLACE / REMOVE PACEMAKER    . LEFT HEART CATH AND CORONARY ANGIOGRAPHY N/A 03/27/2017   Procedure: LEFT HEART CATH AND CORONARY ANGIOGRAPHY;  Surgeon: Minna Merritts, MD;  Location: [place] CV LAB;  Service: Cardiovascular;  Laterality: N/A;  . TEE WITHOUT CARDIOVERSION N/A 05/12/2017   Procedure: TRANSESOPHAGEAL ECHOCARDIOGRAM (TEE);  Surgeon: Sherren Mocha, MD;  Location: [place];  Service: Open Heart Surgery;  Laterality: N/A;  . TRANSCATHETER AORTIC VALVE REPLACEMENT, TRANSFEMORAL N/A 05/12/2017   Procedure: TRANSCATHETER AORTIC VALVE REPLACEMENT, TRANSFEMORAL using a 35m Edwards Sapien 3 Aortic Valve;  Surgeon: CSherren Mocha MD;  Location: [place]  Service: Open Heart Surgery;  Laterality: N/A;  . [place]    . VAGINAL HYSTERECTOMY    . WHIPPLE PROCEDURE  ~ 2013    Current Medications: Current Meds  Medication Sig  . acetaminophen (TYLENOL) 500 MG tablet Take 1,000 mg by mouth every 6 (six) hours as needed for moderate pain.  .Marland Kitchenapixaban (ELIQUIS) 5 MG TABS tablet Take 1 tablet (5 mg total) by mouth 2 (two) times daily.  .Marland Kitchenaspirin EC 81 MG EC tablet Take 1 tablet (81 mg total) by mouth daily.  .Marland Kitchendiltiazem (CARDIZEM CD) 180 MG 24 hr capsule Take 1 capsule (180 mg total) by mouth daily.  .Marland Kitchenescitalopram (LEXAPRO) 10 MG tablet Take 10 mg by mouth at bedtime.   . ferrous sulfate 325 (65 FE) MG tablet Take 325 mg by mouth daily with breakfast.  . gabapentin (NEURONTIN) 600 MG tablet Take 600 mg by mouth 2 (two) times daily.   .Marland Kitchenlevothyroxine (SYNTHROID) 50 MCG tablet Take 1 tablet (50 mcg total) by mouth daily.   .Marland Kitchenomeprazole (PRILOSEC) 20 MG capsule Take 20 mg by mouth daily.  . potassium chloride (K-DUR) 10 MEQ tablet Take 1 tablet (10 mEq total) by mouth 2 (two) times daily.  . pravastatin (PRAVACHOL) 40 MG tablet Take 40 mg by mouth every evening.   . torsemide (DEMADEX) 20 MG tablet Take 1 tablet (20 mg total) by mouth daily. Take one additional tablet daily as needed for swelling.  . Vitamin D, Ergocalciferol, (DRISDOL) 50000 units CAPS capsule Take 50,000 Units by mouth 2 (two) times a week. Take on Sunday and Thursday     Allergies:   Penicillins and Sulfa antibiotics   Social History   Tobacco Use  . Smoking status: Never Smoker  . Smokeless tobacco: Never Used  Substance Use Topics  . Alcohol use: No  . Drug use: No     Family Hx: The patient's family history includes Heart disease in her sister.  ROS:   Please see the history of present illness.    Review of Systems  Cardiovascular: Positive for leg swelling.  Musculoskeletal: Positive  for joint pain.   All other systems reviewed and are negative.   EKGs/Labs/Other Test Reviewed:    EKG:  EKG is  ordered today.  The ekg ordered today demonstrates ventricular paced, HR 64  Recent Labs: 03/25/2017: TSH 13.881 05/08/2017: ALT 20; B Natriuretic Peptide 218.8 05/13/2017: BUN 13; Creatinine, Ser 1.29; Hemoglobin 9.9; Magnesium 1.9; Platelets 106; Potassium 4.2; Sodium 140   Recent Lipid Panel No results found for: CHOL, TRIG, HDL, CHOLHDL, LDLCALC, LDLDIRECT  Physical Exam:    VS:  BP (!) 154/70   Pulse 64   Ht 6' (1.829 m)   Wt 211 lb 1.9 oz (95.8 kg)   SpO2 97%   BMI 28.63 kg/m     Wt Readings from Last 3 Encounters:  05/20/17 211 lb 1.9 oz (95.8 kg)  05/13/17 211 lb 6.7 oz (95.9 kg)  05/08/17 209 lb 11.2 oz (95.1 kg)     Physical Exam  Constitutional: She is oriented to person, place, and time. She appears well-developed and well-nourished. No distress.  HENT:  Head: Normocephalic and atraumatic.  Neck: No  JVD present.  Cardiovascular: Normal rate and regular rhythm.  Murmur heard.  Medium-pitched systolic murmur is present with a grade of 2/6 at the upper right sternal border. Pulmonary/Chest: Effort normal. She has no rales.  Abdominal: Soft.  Musculoskeletal: She exhibits edema. Tenderness: trace-1+ bilat LE edema.  Bilateral groin sites without hematoma or bruit  Neurological: She is alert and oriented to person, place, and time.  Skin: Skin is warm and dry.    ASSESSMENT & PLAN:    1.  S/P TAVR (transcatheter aortic valve replacement)  She is progressing well after recent TAVR.  Follow-up is arranged with Dr. Burt Knack March 29 with follow-up echocardiogram.  We discussed the importance of SBE prophylaxis.  She remains on aspirin 81 in addition to Apixaban.  2.  Chronic diastolic CHF (congestive heart failure) (HCC) Overall, volume appears stable.  She does note a 3 pound weight gain.  She has been instructed in the past to take extra torsemide for weight gain.  I have advised her to take an extra potassium with this to maintain normal potassium levels.  3.  Permanent atrial fibrillation (HCC) Rate controlled.  She remains on Apixaban for anticoagulation.  Her dose remains at 5 mg twice daily given that her weight is greater than 60 kg and her creatinine is less than 1.5.  4.  Coronary artery disease  Mild to moderate nonobstructive coronary disease by cardiac catheterization in January 2019.  She denies anginal symptoms.  Continue statin.  5.  S/P placement of cardiac pacemaker Follow-up with EP as planned.  6.  Essential hypertension Blood pressure above target.  I have advised her to continue to monitor her blood pressure over the next week and send me the readings.  If her pressure remains >130/80, adjust Cardizem to 240 mg daily.  7.  History of torsades de pointes This was in the setting of hypokalemia.  As noted above, I have advised her to take extra potassium if she takes  extra torsemide.   Dispo:  Return in about 4 weeks (around 06/19/2017) for Scheduled Follow Up, w/ Dr. Burt Knack.   Medication Adjustments/Labs and Tests Ordered: Current medicines are reviewed at length with the patient today.  Concerns regarding medicines are outlined above.  Tests Ordered: Orders Placed This Encounter  Procedures  . EKG 12-Lead   Medication Changes: No orders of the defined types were placed in this encounter.  Signed, Richardson Dopp, PA-C  05/20/2017 9:22 AM    [facility] [address], [place], [place]  [address] Phone: [phone]; Fax: [phone]

## 2017-05-20 ENCOUNTER — Ambulatory Visit (INDEPENDENT_AMBULATORY_CARE_PROVIDER_SITE_OTHER): Payer: Medicare Other | Admitting: Physician Assistant

## 2017-05-20 ENCOUNTER — Encounter: Payer: Self-pay | Admitting: Physician Assistant

## 2017-05-20 VITALS — BP 154/70 | HR 64 | Ht 72.0 in | Wt 211.1 lb

## 2017-05-20 DIAGNOSIS — I5032 Chronic diastolic (congestive) heart failure: Secondary | ICD-10-CM | POA: Diagnosis not present

## 2017-05-20 DIAGNOSIS — I4821 Permanent atrial fibrillation: Secondary | ICD-10-CM

## 2017-05-20 DIAGNOSIS — Z950 Presence of cardiac pacemaker: Secondary | ICD-10-CM

## 2017-05-20 DIAGNOSIS — Z952 Presence of prosthetic heart valve: Secondary | ICD-10-CM | POA: Diagnosis not present

## 2017-05-20 DIAGNOSIS — Z8679 Personal history of other diseases of the circulatory system: Secondary | ICD-10-CM

## 2017-05-20 DIAGNOSIS — I2510 Atherosclerotic heart disease of native coronary artery without angina pectoris: Secondary | ICD-10-CM

## 2017-05-20 DIAGNOSIS — I482 Chronic atrial fibrillation: Secondary | ICD-10-CM

## 2017-05-20 DIAGNOSIS — I10 Essential (primary) hypertension: Secondary | ICD-10-CM

## 2017-05-20 NOTE — Patient Instructions (Addendum)
Medication Instructions:  1. .Your physician recommends that you continue on your current medications as directed. Please refer to the Current Medication list given to you today.   Labwork: NONE ORDERED TODAY  Testing/Procedures: NONE ORDERED TODAY; KEEP YOUR ECHO APPT SCHEDULED FOR 06/19/17  Follow-Up: DR. Burt Knack 06/19/17  Any Other Special Instructions Will Be Listed Below (If Applicable). CHECK BLOOD PRESSURE DAILY FOR 1 WEEK AND SEND READINGS TO [facility] [place]    If you need a refill on your cardiac medications before your next appointment, please call your pharmacy.

## 2017-05-21 ENCOUNTER — Ambulatory Visit: Payer: Medicare Other | Admitting: Physician Assistant

## 2017-05-28 ENCOUNTER — Telehealth: Payer: Self-pay | Admitting: Physician Assistant

## 2017-05-28 NOTE — Telephone Encounter (Signed)
I spoke with pt's daughter and readings listed in first message below were in the morning at the same time she was taking her AM medications. The following readings are taken at 6 PM daily. 3/1-148/64 3/2-145/63 3/3-165/75 3/4-132/64 3/5-144/85 3/6-140/67 3/7-131/62-3 PM today.  Daughter also reports pt broke tooth on Saturday. Has not scheduled a dentist visit yet but needs to have this evaluated.  I told her pt would need to take an antibiotic prior to any dental visits.  Pt is allergic to penicilllin and sulfa.  Will need prescription sent to pharmacy.  Also asking if OK to schedule dentist visit.   Will forward to Richardson Dopp, [place] for review

## 2017-05-28 NOTE — Telephone Encounter (Signed)
New message   Pt Daughter sylvia called   Pt c/o BP issue: STAT if pt c/o blurred vision, one-sided weakness or slurred speech  1. What are your last 5 BP readings?  153/65 148/77 161/67 159/68 161/71 153/72 144/72 (these were taken in the morning)  2. Are you having any other symptoms (ex. Dizziness, headache, blurred vision, passed out)? no  3. What is your BP issue? Arbie Cookey wanted her to call and give these readings

## 2017-05-28 NOTE — Telephone Encounter (Signed)
BP readings mostly close to target.  I would recommend continued observation for now. As she is PCN allergic, Clindamycin 600 mg 30-60 minutes before the procedure should be given. However, since she just had TAVR done recently, I would defer whether or not she can have a dental procedure to Dr. Burt Knack.  I will route note to him as well. Richardson Dopp, PA-C    05/28/2017 4:43 PM

## 2017-05-29 NOTE — Telephone Encounter (Signed)
She is ok to see dentist and should use clindamycin as Scott outlined. thanks

## 2017-06-01 IMAGING — DX DG CHEST 2V
2 series · 2 of 2 positions shown · non-contrast
Comparison: February 22, 2016

CLINICAL DATA: Cardiac arrhythmia with pacemaker placement

EXAM:
CHEST  2 VIEW

[chest pa]
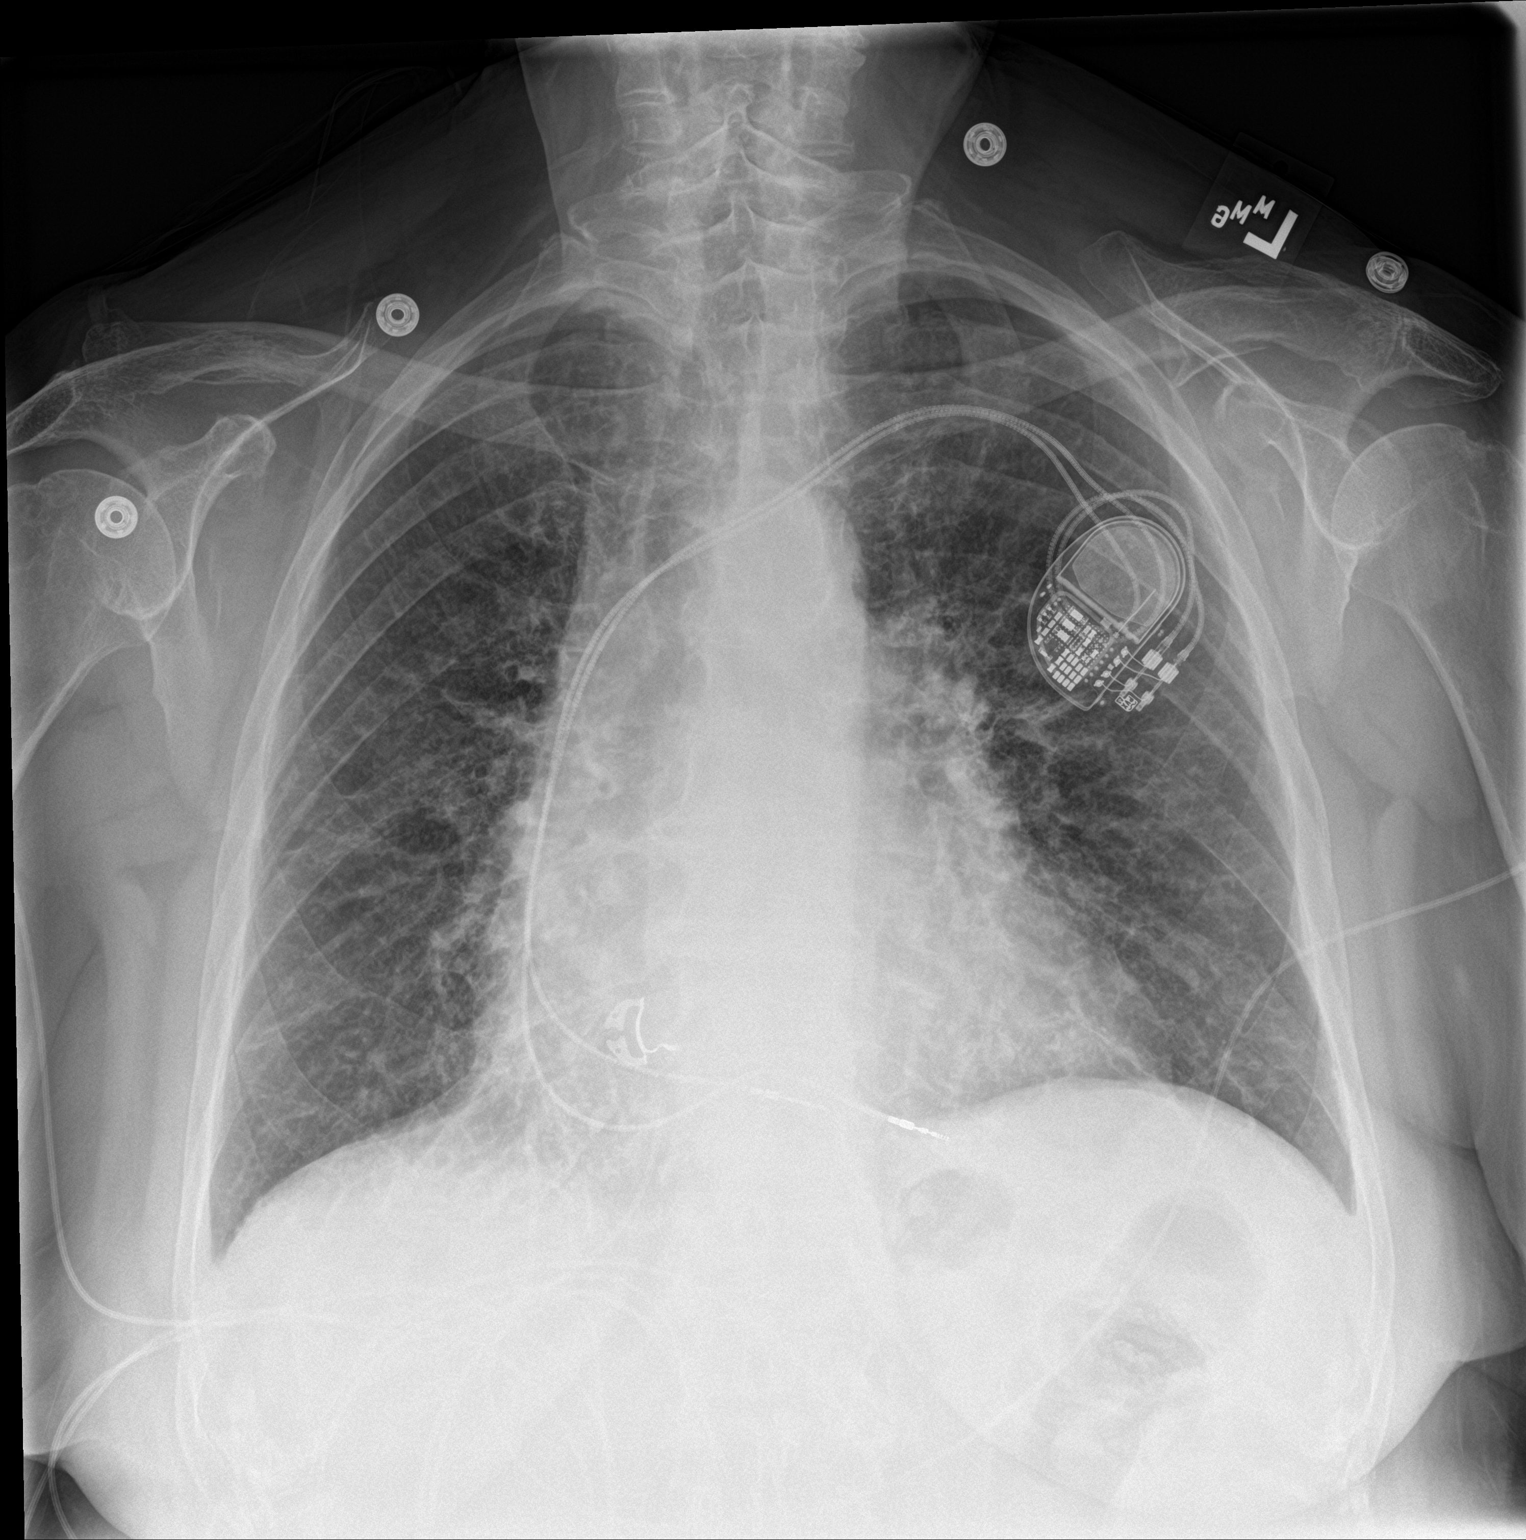

[chest lat]
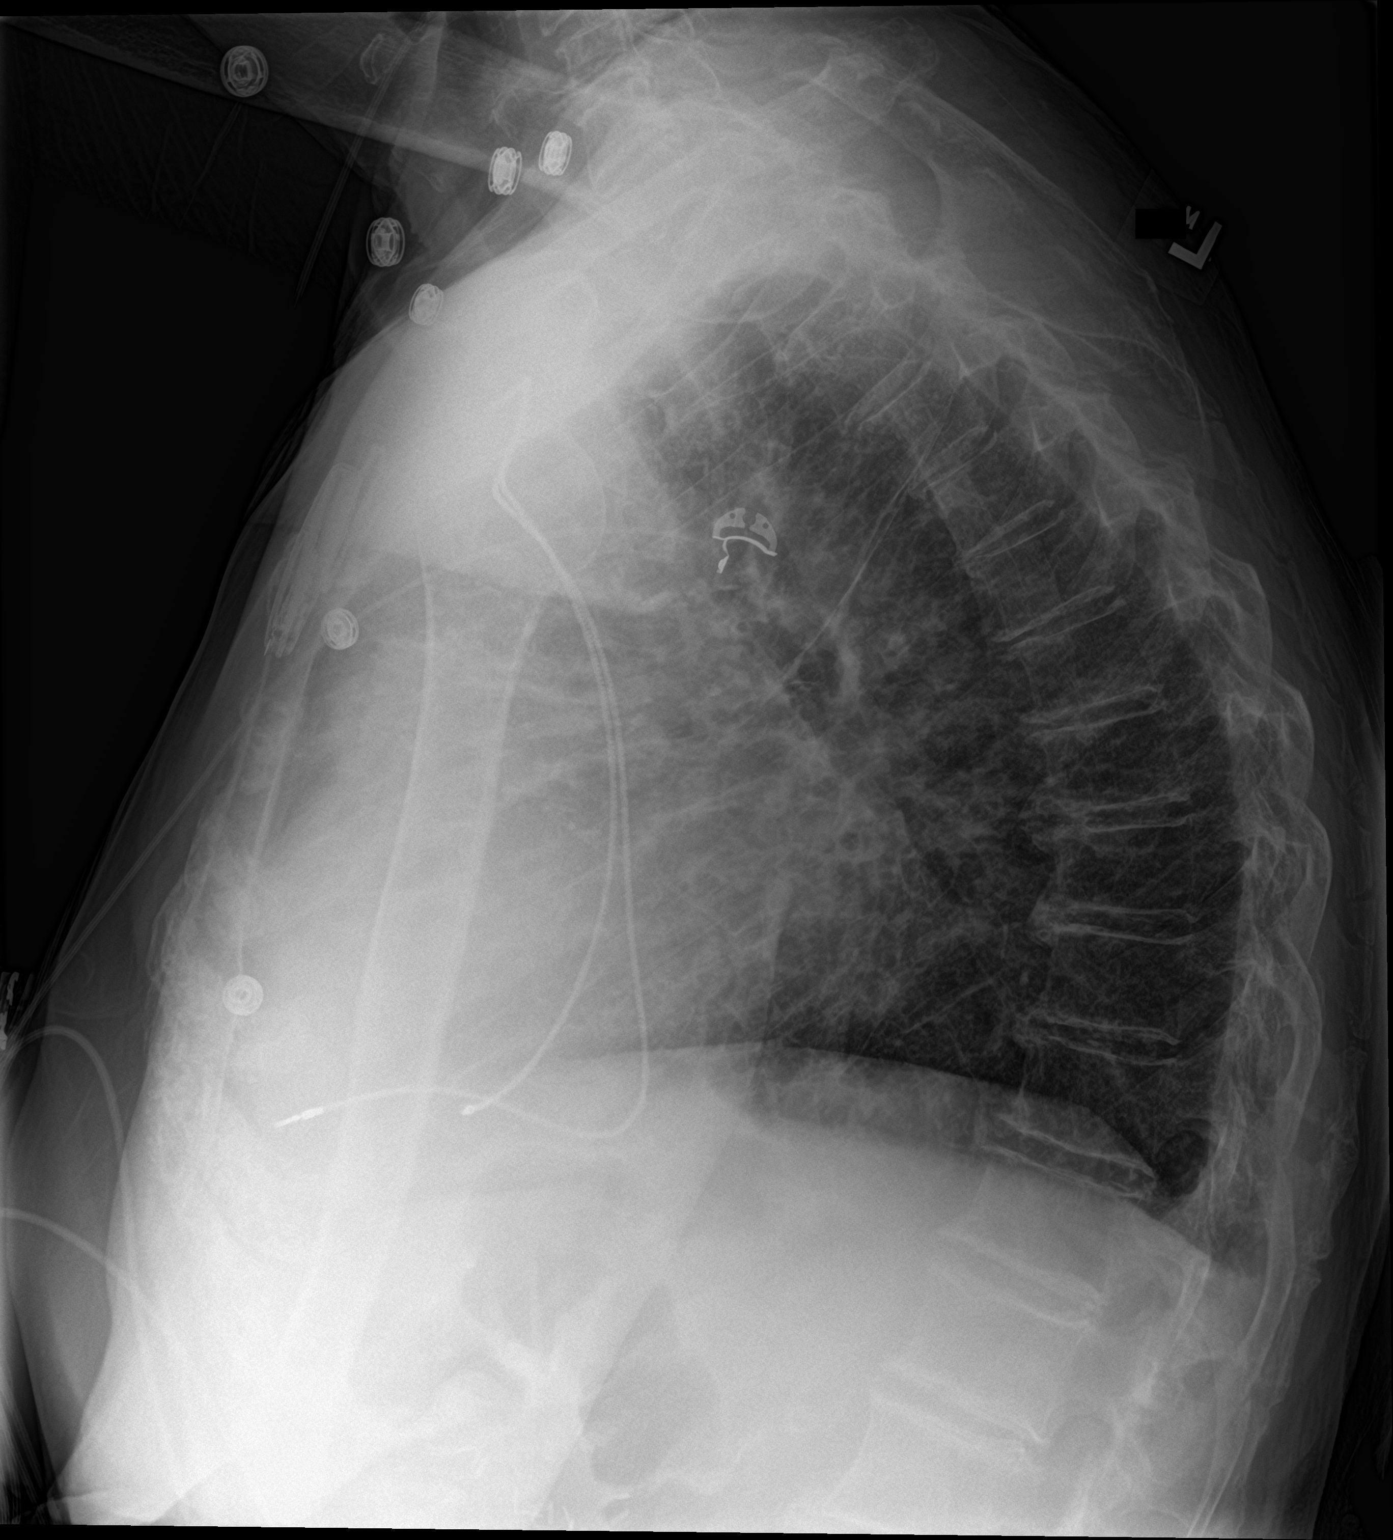

[2 of 2 positions shown; findings below may reference images not displayed]

FINDINGS: There is a pacemaker lead which clearly extends to the right
ventricle. A second pacemaker lead appears somewhat inferior in
location with respect to expected location of the right atrium. No
pneumothorax. There remains interstitial pulmonary edema with mild
cardiomegaly. There appears to be pulmonary venous hypertension as
well. There is atherosclerotic calcification aorta. No adenopathy.
IMPRESSION: Pacemaker with leads as described. The more posterior lead tip is
more inferior in location than is generally seen with right atrial
placement. On this study, is not possible to confirm that this lead
is lead attached to the right atrium. The more anterior lead tip is
clearly in the right ventricular region. No pneumothorax. Evidence
of underlying congestive heart failure persists. There is
atherosclerotic calcification in the aorta.

These results will be called to the ordering clinician or
representative by the Radiologist Assistant, and communication
documented in the PACS or zVision Dashboard.

## 2017-06-02 ENCOUNTER — Ambulatory Visit (INDEPENDENT_AMBULATORY_CARE_PROVIDER_SITE_OTHER): Payer: Medicare Other | Admitting: *Deleted

## 2017-06-02 DIAGNOSIS — I495 Sick sinus syndrome: Secondary | ICD-10-CM

## 2017-06-02 NOTE — Progress Notes (Signed)
Remote pacemaker transmission.   

## 2017-06-03 ENCOUNTER — Encounter: Payer: Self-pay | Admitting: Cardiology

## 2017-06-03 NOTE — Telephone Encounter (Signed)
Informed Theresa Cain that Dr. Burt Knack states it's fine to see the dentist and that she will need to take Clinda 600 mg prior to any procedure/dental appointment. She states she will let the dentist know and will call back if the dentist will not sent Rx (she wanted to try with them first). She was grateful for call.

## 2017-06-04 LAB — CUP PACEART REMOTE DEVICE CHECK
Battery Voltage: 3.01 V
Brady Statistic RA Percent Paced: 0.01 %
Brady Statistic RV Percent Paced: 92.1 %
Date Time Interrogation Session: 20190312110710
Implantable Lead Implant Date: 20171212
Implantable Lead Location: 753859
Implantable Lead Model: 5076
Implantable Pulse Generator Implant Date: 20171212
Lead Channel Impedance Value: 418 Ohm
Lead Channel Impedance Value: 494 Ohm
Lead Channel Pacing Threshold Pulse Width: 0.4 ms
Lead Channel Sensing Intrinsic Amplitude: 1.5 mV
Lead Channel Sensing Intrinsic Amplitude: 1.5 mV
Lead Channel Sensing Intrinsic Amplitude: 14.125 mV
Lead Channel Setting Pacing Amplitude: 2.5 V
Lead Channel Setting Pacing Pulse Width: 0.4 ms
Lead Channel Setting Sensing Sensitivity: 2.8 mV
MDC IDC LEAD IMPLANT DT: 20171212
MDC IDC LEAD LOCATION: 753860
MDC IDC MSMT BATTERY REMAINING LONGEVITY: 86 mo
MDC IDC MSMT LEADCHNL RA IMPEDANCE VALUE: 323 Ohm
MDC IDC MSMT LEADCHNL RA IMPEDANCE VALUE: 418 Ohm
MDC IDC MSMT LEADCHNL RV PACING THRESHOLD AMPLITUDE: 0.5 V
MDC IDC MSMT LEADCHNL RV SENSING INTR AMPL: 14.125 mV
MDC IDC SET LEADCHNL RA PACING AMPLITUDE: 2 V
MDC IDC STAT BRADY AP VP PERCENT: 0.01 %
MDC IDC STAT BRADY AP VS PERCENT: 0 %
MDC IDC STAT BRADY AS VP PERCENT: 92.06 %
MDC IDC STAT BRADY AS VS PERCENT: 7.94 %

## 2017-06-09 ENCOUNTER — Telehealth: Payer: Self-pay | Admitting: Cardiovascular Disease

## 2017-06-09 MED ORDER — CLINDAMYCIN HCL 300 MG PO CAPS: ORAL_CAPSULE | ORAL | 6 refills | Status: DC

## 2017-06-09 NOTE — Telephone Encounter (Signed)
Pt c/o medication issue:  1. Name of Medication: Clinda   2. How are you currently taking this medication (dosage and times per day)? 600 mg  3. Are you having a reaction (difficulty breathing--STAT)? no  4. What is your medication issue? Patient needs prescription for medication sent to [place], [place].  See note from 06-03-17

## 2017-06-09 NOTE — Telephone Encounter (Signed)
Per 3/7 phone encounter, Richardson Dopp states, "As she is PCN allergic, Clindamycin 600 mg 30-60 minutes before the procedure should be given."  Clindamycin 600 mg called in to pharmacy.  Left message for patient that abx have been called in and to contact the office with questions or concerns.

## 2017-06-11 ENCOUNTER — Encounter: Payer: Self-pay | Admitting: Thoracic Surgery (Cardiothoracic Vascular Surgery)

## 2017-06-19 ENCOUNTER — Ambulatory Visit (INDEPENDENT_AMBULATORY_CARE_PROVIDER_SITE_OTHER): Payer: Medicare Other | Admitting: Cardiovascular Disease

## 2017-06-19 ENCOUNTER — Other Ambulatory Visit: Payer: Self-pay

## 2017-06-19 ENCOUNTER — Ambulatory Visit (HOSPITAL_COMMUNITY): Payer: Medicare Other | Attending: Cardiovascular Disease

## 2017-06-19 ENCOUNTER — Encounter: Payer: Self-pay | Admitting: Cardiovascular Disease

## 2017-06-19 VITALS — BP 142/78 | HR 71 | Ht 72.0 in | Wt 206.4 lb

## 2017-06-19 DIAGNOSIS — I4891 Unspecified atrial fibrillation: Secondary | ICD-10-CM | POA: Insufficient documentation

## 2017-06-19 DIAGNOSIS — I509 Heart failure, unspecified: Secondary | ICD-10-CM | POA: Insufficient documentation

## 2017-06-19 DIAGNOSIS — E785 Hyperlipidemia, unspecified: Secondary | ICD-10-CM | POA: Diagnosis not present

## 2017-06-19 DIAGNOSIS — I350 Nonrheumatic aortic (valve) stenosis: Secondary | ICD-10-CM | POA: Insufficient documentation

## 2017-06-19 DIAGNOSIS — I739 Peripheral vascular disease, unspecified: Secondary | ICD-10-CM | POA: Diagnosis not present

## 2017-06-19 DIAGNOSIS — N189 Chronic kidney disease, unspecified: Secondary | ICD-10-CM | POA: Diagnosis not present

## 2017-06-19 DIAGNOSIS — I25119 Atherosclerotic heart disease of native coronary artery with unspecified angina pectoris: Secondary | ICD-10-CM | POA: Diagnosis not present

## 2017-06-19 DIAGNOSIS — I2510 Atherosclerotic heart disease of native coronary artery without angina pectoris: Secondary | ICD-10-CM | POA: Diagnosis not present

## 2017-06-19 DIAGNOSIS — Z952 Presence of prosthetic heart valve: Secondary | ICD-10-CM | POA: Insufficient documentation

## 2017-06-19 DIAGNOSIS — I130 Hypertensive heart and chronic kidney disease with heart failure and stage 1 through stage 4 chronic kidney disease, or unspecified chronic kidney disease: Secondary | ICD-10-CM | POA: Insufficient documentation

## 2017-06-19 NOTE — Patient Instructions (Addendum)
Medication Instructions:  1) Try taking your Torsemide once daily  Your physician discussed the importance of taking an antibiotic prior to any dental, gastrointestinal, genitourinary procedures to prevent damage to the heart valves from infection. You were given a prescription for an antibiotic based on current SBE prophylaxis guidelines.  Labwork: None  Testing/Procedures: Your provider has requested that you have an echocardiogram in one year. Echocardiography is a painless test that uses sound waves to create images of your heart. It provides your doctor with information about the size and shape of your heart and how well your heart's chambers and valves are working. This procedure takes approximately one hour. There are no restrictions for this procedure.  Follow-Up: Your provider recommends that you schedule a follow-up appointment in 2 months with Dr. Rockey Situ.  Your provider wants you to follow-up in: 1 year with Nell Range, PA. You will receive a reminder letter in the mail two months in advance. If you don't receive a letter, please call our office to schedule the follow-up appointment.    Any Other Special Instructions Will Be Listed Below (If Applicable).     If you need a refill on your cardiac medications before your next appointment, please call your pharmacy.

## 2017-06-19 NOTE — Progress Notes (Signed)
Cardiology Office Note Date:  06/19/2017   ID:  Theresa Cain, DOB 08-27-1926, MRN [id number]  PCP:  Kendrick Ranch, MD  Cardiologist:  Sherren Mocha, MD    Chief Complaint  Patient presents with  . Follow-up    30 day TAVR     History of Present Illness: Theresa Cain is a 84 y.o. female who presents for follow-up after undergoing TAVR with a 26 mm Edwards sapien she underwent 3 transcatheter heart valve May 12, 2017.  The procedure was performed via a percutaneous right transfemoral approach.  The procedure was uncomplicated and the patient was discharged home the following day.  She is chronically anticoagulated with apixaban for permanent atrial fibrillation.  She's doing very well. Today, she denies symptoms of palpitations, chest pain, shortness of breath, orthopnea, PND, lower extremity edema, dizziness, or syncope. Tolerating her medicines without side effects.   Past Medical History:  Diagnosis Date  . Acute on chronic diastolic CHF (congestive heart failure) ([place]) 02/22/2016  . Anemia   . Bradycardia   . Carotid artery occlusion   . Chronic diastolic CHF (congestive heart failure) ([place])   . Chronic kidney disease    elevated creatinne  . Coronary artery disease involving native coronary artery of native heart   . GERD (gastroesophageal reflux disease)   . HLD (hyperlipidemia)   . Hypertension   . Hypothyroidism   . Obesity   . Osteoarthritis   . Permanent atrial fibrillation ([place])   . Presence of permanent cardiac pacemaker   . PVD (peripheral vascular disease) ([place])   . 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 Severe aortic stenosis   . Stomach cancer ([place]) ~ 2013  . Vertigo     Past Surgical History:  Procedure Laterality Date  . CARDIOVASCULAR STRESS TEST    . CARDIOVERSION N/A 08/08/2016   Procedure: CARDIOVERSION;   Surgeon: Sueanne Margarita, MD;  Location: [facility] ENDOSCOPY;  Service: Cardiovascular;  Laterality: N/A;  . CAROTID ENDARTERECTOMY Right ~ 2005  . CATARACT EXTRACTION W/ INTRAOCULAR LENS IMPLANT Right   . CHOLECYSTECTOMY  ~ 2013   "part of her [facility] OR"  . EP IMPLANTABLE DEVICE N/A 03/04/2016   Procedure: Pacemaker Implant;  Surgeon: Will Meredith Leeds, MD;  Location: [place] [facility];  Service: Cardiovascular;  Laterality: N/A;  . EP IMPLANTABLE DEVICE N/A 03/05/2016   Procedure: Lead Revision/Repair;  Surgeon: Deboraha Sprang, MD;  Location: [facility] CV LAB;  Service: Cardiovascular;  Laterality: N/A;  . INSERT / REPLACE / REMOVE PACEMAKER    . LEFT HEART CATH AND CORONARY ANGIOGRAPHY N/A 03/27/2017   Procedure: LEFT HEART CATH AND CORONARY ANGIOGRAPHY;  Surgeon: Minna Merritts, MD;  Location: [facility] LAB;  Service: Cardiovascular;  Laterality: N/A;  . TEE WITHOUT CARDIOVERSION N/A 05/12/2017   Procedure: TRANSESOPHAGEAL ECHOCARDIOGRAM (TEE);  Surgeon: Sherren Mocha, MD;  Location: [facility];  Service: Open Heart Surgery;  Laterality: N/A;  . TRANSCATHETER AORTIC VALVE REPLACEMENT, TRANSFEMORAL N/A 05/12/2017   Procedure: TRANSCATHETER AORTIC VALVE REPLACEMENT, TRANSFEMORAL using a 15mm Edwards Sapien 3 Aortic Valve;  Surgeon: Sherren Mocha, MD;  Location: [facility];  Service: Open Heart Surgery;  Laterality: N/A;  . US ECHOCARDIOGRAPHY    . VAGINAL HYSTERECTOMY    . WHIPPLE PROCEDURE  ~ 2013    Current Outpatient Medications  Medication Sig Dispense Refill  . acetaminophen (TYLENOL) 500 MG  tablet Take 1,000 mg by mouth every 6 (six) hours as needed for moderate pain.    Marland Kitchen apixaban (ELIQUIS) 5 MG TABS tablet Take 1 tablet (5 mg total) by mouth 2 (two) times daily. 60 tablet 6  . aspirin EC 81 MG EC tablet Take 1 tablet (81 mg total) by mouth daily.    . clindamycin (CLEOCIN) 300 MG capsule Take 30-60 minutes prior to dental visit. 2 capsule 6  . diltiazem (CARDIZEM CD) 180 MG 24 hr  capsule Take 1 capsule (180 mg total) by mouth daily. 90 capsule 3  . escitalopram (LEXAPRO) 10 MG tablet Take 10 mg by mouth at bedtime.     . ferrous sulfate 325 (65 FE) MG tablet Take 325 mg by mouth daily with breakfast.    . gabapentin (NEURONTIN) 600 MG tablet Take 600 mg by mouth 2 (two) times daily.     Marland Kitchen levothyroxine (SYNTHROID) 50 MCG tablet Take 1 tablet (50 mcg total) by mouth daily. 30 tablet 0  . omeprazole (PRILOSEC) 20 MG capsule Take 20 mg by mouth daily.    . potassium chloride (K-DUR) 10 MEQ tablet Take 1 tablet (10 mEq total) by mouth 2 (two) times daily. 60 tablet 0  . pravastatin (PRAVACHOL) 40 MG tablet Take 40 mg by mouth every evening.     . torsemide (DEMADEX) 20 MG tablet Take 1 tablet (20 mg total) by mouth daily. Take one additional tablet daily as needed for swelling. 45 tablet 11  . Vitamin D, Ergocalciferol, (DRISDOL) 50000 units CAPS capsule Take 50,000 Units by mouth 2 (two) times a week. Take on Sunday and Thursday     No current facility-administered medications for this visit.     Allergies:   Penicillins and Sulfa antibiotics   Social History:  The patient  reports that she has never smoked. She has never used smokeless tobacco. She reports that she does not drink alcohol or use drugs.   Family History:  The patient's  family history includes Heart disease in her sister.    ROS:  Please see the history of present illness.  Otherwise, review of systems is positive for fatigue, easy bruising, leg swelling, balance problems.  All other systems are reviewed and negative.    PHYSICAL EXAM: VS:  BP (!) 142/78   Pulse 71   Ht 6' (1.829 m)   Wt 206 lb 6.4 oz (93.6 kg)   SpO2 97%   BMI 27.99 kg/m  , BMI Body mass index is 27.99 kg/m. GEN: Well nourished, well developed, in no acute distress  HEENT: normal  Neck: no JVD, no masses.  Cardiac: irregularly irregular with a 2/6 SEM at the RUSB, no diastolic murmur                Respiratory:  clear to  auscultation bilaterally, normal work of breathing GI: soft, nontender, nondistended, + BS MS: no deformity or atrophy  Ext: trace pretibial edema Skin: warm and dry, no rash Neuro:  Strength and sensation are intact Psych: euthymic mood, full affect  EKG:  EKG is not ordered today.  Recent Labs: 03/25/2017: TSH 13.881 05/08/2017: ALT 20; B Natriuretic Peptide 218.8 05/13/2017: BUN 13; Creatinine, Ser 1.29; Hemoglobin 9.9; Magnesium 1.9; Platelets 106; Potassium 4.2; Sodium 140   Lipid Panel  No results found for: CHOL, TRIG, HDL, CHOLHDL, VLDL, LDLCALC, LDLDIRECT    Wt Readings from Last 3 Encounters:  06/19/17 206 lb 6.4 oz (93.6 kg)  05/20/17 211 lb 1.9 oz (  95.8 kg)  05/13/17 211 lb 6.7 oz (95.9 kg)     Cardiac Studies Reviewed: Echo 06-19-2016: Study Conclusions  - Left ventricle: The cavity size was normal. Systolic function was   normal. The estimated ejection fraction was in the range of 60%   to 65%. Wall motion was normal; there were no regional wall   motion abnormalities. Doppler parameters are consistent with high   ventricular filling pressure. - Aortic valve: S/P 25mm Edwards Sapien bioprosthetic TAVR which   appears to be functioning normally. There is trivial perivalvular   leak but no aortic stenosis. Mean gradient (S): 12 mm Hg. Valve   area (VTI): 1.89 cm^2. Valve area (Vmax): 1.69 cm^2. Valve area   (Vmean): 1.89 cm^2. - Aorta: Aortic root dimension: 38 mm (ED). Ascending aortic   diameter: 41 mm (S). - Aortic root: The aortic root was mildly dilated. - Ascending aorta: The ascending aorta was mildly dilated. - Mitral valve: Calcified annulus. There was mild regurgitation. - Left atrium: The atrium was severely dilated. - Right ventricle: The cavity size was moderately dilated. Wall   thickness was normal. - Right atrium: The atrium was severely dilated. - Pulmonary arteries: PA peak pressure: 58 mm Hg (S).  Impressions:  - Normal LVF, stable TAVR  with trivial perivalvular leak, severe   biatrial enlargement, moderate pulmonary HTN. No significant   change from prior echo. The right ventricular systolic pressure   was increased consistent with moderate pulmonary hypertension.  ASSESSMENT AND PLAN: Severe aortic stenosis s/p TAVR: pt has recovered well from TAVR. Appears to have NYHA functional class 1 symptoms of chronic diastolic HF. I personally reviewed her echo images demonstrating normal function of her transcatheter heart valve with normal gradients and trace paravalvular regurgitation. She should continue ASA 81 mg for 3-6 months in the setting of chronic oral anticoagulation with apixaban. She is released to all of her normal activities. She will follow-up with Dr Rockey Situ in 2-3 months. SBE prophylaxis indications reviewed with her today.   Current medicines are reviewed with the patient today.  The patient does not have concerns regarding medicines.  Labs/ tests ordered today include:  No orders of the defined types were placed in this encounter.   Disposition:   FU one year Valve [facility]  Signed, Sherren Mocha, MD  06/19/2017 3:53 PM    [address], [place], [place]  [address] Phone: [phone]; Fax: [phone]

## 2017-06-29 ENCOUNTER — Encounter: Payer: Self-pay | Admitting: Thoracic Surgery (Cardiothoracic Vascular Surgery)

## 2017-08-24 NOTE — Progress Notes (Signed)
Cardiology Office Note  Date:  08/25/2017   ID:  Theresa Cain, DOB Dec 12, 1926, MRN [id number]  PCP:  Kendrick Ranch, MD   Chief Complaint  Patient presents with  . Other    2 month follow up from echo Per Dr Burt Knack. Patient denies chest pain and SOB. Meds reviewed verbally with patient.     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  She initially presented to hospital genera first 2019 with diaphoresis and weakness Telemetry reviewed showing sustained VT, torsades, potassium 3.0 Workup in the hospital showing severe aortic valve stenosis Mean gradient high 40s, peak velocity 500 cm per sec   cardiac catheterization 03/27/2017 multivessel coronary disease with 40% stenosis of the LAD, high-grade stenosis of a diagonal branch, and 75% stenosis of the distal right coronary artery  echocardiogram at that time showed severe aortic stenosis with a mean transvalvular gradient of 49 mmHg. Left ventricular systolic function was normal. There was mild to moderate mitral regurgitation and moderate tricuspid regurgitation with moderate pulmonary hypertension.   s/p successful TAVR with a 47mm Edwards Sapien 3 THV via the TF approach on 05/12/17. Post operative echo shows normal LV systolic function, normal function of her transcatheter aortic valve with a mean gradient of 9 mmHg and trivial paravalvular regurgitation.  No sob, Takes torsemide one a day Takes torsemide 1 1/2 to 2 pills daily as needed for leg swelling Some gait instability No regular exercise program but overall feels well  EKG personally reviewed by myself on todays  visit Shows atrial fibrillation with ventricular rate 67 bpm, intermittent paced beats   PMH:   has a past medical history of Acute on chronic diastolic CHF (congestive heart failure) ([place]) (02/22/2016), Anemia, Bradycardia, Carotid artery occlusion, Chronic diastolic CHF (congestive heart failure) ([place]), Chronic kidney disease, Coronary artery disease involving native coronary artery of native heart, GERD (gastroesophageal reflux disease), HLD (hyperlipidemia), Hypertension, Hypothyroidism, Obesity, Osteoarthritis, Permanent atrial fibrillation ([place]), Presence of permanent cardiac pacemaker, PVD (peripheral vascular disease) ([place]), S/P TAVR (transcatheter aortic valve replacement) (05/12/2017), Severe aortic stenosis, Stomach cancer ([place]) (~ 2013), and Vertigo.  PSH:    Past Surgical History:  Procedure Laterality Date  . CARDIOVASCULAR STRESS TEST    . CARDIOVERSION N/A 08/08/2016   Procedure: CARDIOVERSION;  Surgeon: Sueanne Margarita, MD;  Location: [facility] ENDOSCOPY;  Service: Cardiovascular;  Laterality: N/A;  . CAROTID ENDARTERECTOMY Right ~ 2005  . CATARACT EXTRACTION W/ INTRAOCULAR LENS IMPLANT Right   . CHOLECYSTECTOMY  ~ 2013   "part of her [facility] OR"  . EP IMPLANTABLE DEVICE N/A 03/04/2016   Procedure: Pacemaker Implant;  Surgeon: Will Meredith Leeds, MD;  Location: [facility] CV LAB;  Service: Cardiovascular;  Laterality: N/A;  . EP IMPLANTABLE DEVICE N/A 03/05/2016   Procedure: Lead Revision/Repair;  Surgeon: Deboraha Sprang, MD;  Location: [facility] LAB;  Service: Cardiovascular;  Laterality: N/A;  . INSERT / REPLACE / REMOVE PACEMAKER    . LEFT HEART CATH AND CORONARY ANGIOGRAPHY N/A 03/27/2017   Procedure: LEFT HEART CATH AND CORONARY ANGIOGRAPHY;  Surgeon: Minna Merritts, MD;  Location: [facility] LAB;  Service: Cardiovascular;  Laterality: N/A;  . TEE WITHOUT CARDIOVERSION N/A 05/12/2017  Procedure: TRANSESOPHAGEAL ECHOCARDIOGRAM (TEE);  Surgeon: Sherren Mocha, MD;   Location: [facility];  Service: Open Heart Surgery;  Laterality: N/A;  . TRANSCATHETER AORTIC VALVE REPLACEMENT, TRANSFEMORAL N/A 05/12/2017   Procedure: TRANSCATHETER AORTIC VALVE REPLACEMENT, TRANSFEMORAL using a 91mm Edwards Sapien 3 Aortic Valve;  Surgeon: Sherren Mocha, MD;  Location: [facility];  Service: Open Heart Surgery;  Laterality: N/A;  . US ECHOCARDIOGRAPHY    . VAGINAL HYSTERECTOMY    . WHIPPLE PROCEDURE  ~ 2013    Current Outpatient Medications  Medication Sig Dispense Refill  . acetaminophen (TYLENOL) 500 MG tablet Take 1,000 mg by mouth every 6 (six) hours as needed for moderate pain.    Marland Kitchen apixaban (ELIQUIS) 5 MG TABS tablet Take 1 tablet (5 mg total) by mouth 2 (two) times daily. 60 tablet 6  . aspirin EC 81 MG EC tablet Take 1 tablet (81 mg total) by mouth daily.    . clindamycin (CLEOCIN) 300 MG capsule Take 30-60 minutes prior to dental visit. 2 capsule 6  . diltiazem (CARDIZEM CD) 180 MG 24 hr capsule Take 1 capsule (180 mg total) by mouth daily. 90 capsule 3  . escitalopram (LEXAPRO) 10 MG tablet Take 10 mg by mouth at bedtime.     . ferrous sulfate 325 (65 FE) MG tablet Take 325 mg by mouth daily with breakfast.    . gabapentin (NEURONTIN) 600 MG tablet Take 600 mg by mouth 2 (two) times daily.     Marland Kitchen levothyroxine (SYNTHROID) 50 MCG tablet Take 1 tablet (50 mcg total) by mouth daily. 30 tablet 0  . omeprazole (PRILOSEC) 20 MG capsule Take 20 mg by mouth daily.    . potassium chloride (K-DUR) 10 MEQ tablet Take 1 tablet (10 mEq total) by mouth 2 (two) times daily. 60 tablet 0  . pravastatin (PRAVACHOL) 40 MG tablet Take 40 mg by mouth every evening.     . torsemide (DEMADEX) 20 MG tablet Take 1 tablet (20 mg total) by mouth daily. Take one additional tablet daily as needed for swelling. 45 tablet 11  . Vitamin D, Ergocalciferol, (DRISDOL) 50000 units CAPS capsule Take 50,000 Units by mouth 2 (two) times a week. Take on Sunday and Thursday     No current facility-administered  medications for this visit.      Allergies:   Penicillins and Sulfa antibiotics   Social History:  The patient  reports that she has never smoked. She has never used smokeless tobacco. She reports that she does not drink alcohol or use drugs.   Family History:   family history includes Heart disease in her sister.    Review of Systems: Review of Systems  Constitutional: Negative.   Respiratory: Negative.   Cardiovascular: Negative.   Gastrointestinal: Negative.   Musculoskeletal: Negative.        Gait instability  Neurological: Negative.   Psychiatric/Behavioral: Negative.   All other systems reviewed and are negative.    PHYSICAL EXAM: VS:  BP 140/66 (BP Location: Left Arm, Patient Position: Sitting, Cuff Size: Normal)   Pulse 67   Ht 6' (1.829 m)   Wt 208 lb (94.3 kg)   BMI 28.21 kg/m  , BMI Body mass index is 28.21 kg/m. GEN: Well nourished, well developed, in no acute distress  HEENT: normal  Neck: no JVD, carotid bruits, or masses Cardiac: RRR; no murmurs, rubs, or gallops,no edema  Respiratory:  clear to auscultation bilaterally, normal work of breathing GI: soft, nontender, nondistended, + BS MS: no deformity  or atrophy  Skin: warm and dry, no rash Neuro:  Strength and sensation are intact Psych: euthymic mood, full affect    Recent Labs: 03/25/2017: TSH 13.881 05/08/2017: ALT 20; B Natriuretic Peptide 218.8 05/13/2017: BUN 13; Creatinine, Ser 1.29; Hemoglobin 9.9; Magnesium 1.9; Platelets 106; Potassium 4.2; Sodium 140    Lipid Panel No results found for: CHOL, HDL, LDLCALC, TRIG    Wt Readings from Last 3 Encounters:  08/25/17 208 lb (94.3 kg)  06/19/17 206 lb 6.4 oz (93.6 kg)  05/20/17 211 lb 1.9 oz (95.8 kg)       ASSESSMENT AND PLAN:  Chronic diastolic CHF (congestive heart failure) (HCC) - Plan: EKG 12-Lead Recommended she stay on torsemide 20 mg daily Extra half dose or full dose as needed for any large hemi-swelling, shortness of  breath Weight gain  Permanent atrial fibrillation ([place]) - Plan: EKG 12-Lead Rate relatively well-controlled Tolerating anticoagulation  S/P TAVR (transcatheter aortic valve replacement) Echocardiogram March 2019 well-seated valve Consider repeat echocardiogram 1 year  Essential hypertension Blood pressure is well controlled on today's visit. No changes made to the medications.  Severe aortic stenosis Recent felt surgery for severe stenosis  Coronary artery disease of native artery of native heart with stable angina pectoris (HCC) Currently with no symptoms of angina. No further workup at this time. Continue current medication regimen. Stressed importance of aggressive lipid control We'll continue pravastatin  Chronic kidney disease, unspecified CKD stage Stable renal function creatinine 1.3  Disposition:   F/U  12 months  Long review of Hospitalrecords  Total encounter time more than 45 minutes  Greater than 50% was spent in counseling and coordination of care with the patient    Orders Placed This Encounter  Procedures  . EKG 12-Lead     Signed, Esmond Plants, M.D., Ph.D. 08/25/2017  [place], [place]

## 2017-08-25 ENCOUNTER — Ambulatory Visit (INDEPENDENT_AMBULATORY_CARE_PROVIDER_SITE_OTHER): Payer: Medicare Other | Admitting: Cardiovascular Disease

## 2017-08-25 ENCOUNTER — Encounter: Payer: Self-pay | Admitting: Cardiovascular Disease

## 2017-08-25 VITALS — BP 140/66 | HR 67 | Ht 72.0 in | Wt 208.0 lb

## 2017-08-25 DIAGNOSIS — I4821 Permanent atrial fibrillation: Secondary | ICD-10-CM

## 2017-08-25 DIAGNOSIS — I350 Nonrheumatic aortic (valve) stenosis: Secondary | ICD-10-CM

## 2017-08-25 DIAGNOSIS — I25118 Atherosclerotic heart disease of native coronary artery with other forms of angina pectoris: Secondary | ICD-10-CM | POA: Diagnosis not present

## 2017-08-25 DIAGNOSIS — N189 Chronic kidney disease, unspecified: Secondary | ICD-10-CM

## 2017-08-25 DIAGNOSIS — Z952 Presence of prosthetic heart valve: Secondary | ICD-10-CM | POA: Diagnosis not present

## 2017-08-25 DIAGNOSIS — I5032 Chronic diastolic (congestive) heart failure: Secondary | ICD-10-CM | POA: Diagnosis not present

## 2017-08-25 DIAGNOSIS — I10 Essential (primary) hypertension: Secondary | ICD-10-CM

## 2017-08-25 DIAGNOSIS — I25119 Atherosclerotic heart disease of native coronary artery with unspecified angina pectoris: Secondary | ICD-10-CM | POA: Diagnosis not present

## 2017-08-25 DIAGNOSIS — I482 Chronic atrial fibrillation: Secondary | ICD-10-CM | POA: Diagnosis not present

## 2017-08-25 NOTE — Patient Instructions (Addendum)
Goal total chol <150 Goal LDL <70   Medication Instructions:   No medication changes made  Labwork:  No new labs needed  Testing/Procedures:  No further testing at this time   Follow-Up: It was a pleasure seeing you in the office today. Please call us if you have new issues that need to be addressed before your next appt.  [phone]  Your physician wants you to follow-up in: 12 months.  You will receive a reminder letter in the mail two months in advance. If you don't receive a letter, please call our office to schedule the follow-up appointment.  If you need a refill on your cardiac medications before your next appointment, please call your pharmacy.  For educational health videos Log in to : [email or web address] Or : [email or web address], password : [email or web address]

## 2017-09-01 ENCOUNTER — Ambulatory Visit (INDEPENDENT_AMBULATORY_CARE_PROVIDER_SITE_OTHER): Payer: Medicare Other | Admitting: *Deleted

## 2017-09-01 DIAGNOSIS — I495 Sick sinus syndrome: Secondary | ICD-10-CM | POA: Diagnosis not present

## 2017-09-01 LAB — CUP PACEART REMOTE DEVICE CHECK
Brady Statistic AP VS Percent: 0 %
Brady Statistic AS VP Percent: 93.15 %
Brady Statistic AS VS Percent: 6.84 %
Brady Statistic RA Percent Paced: 0.01 %
Brady Statistic RV Percent Paced: 93.24 %
Date Time Interrogation Session: 20190611112341
Implantable Lead Implant Date: 20171212
Implantable Lead Location: 753860
Implantable Lead Model: 5076
Lead Channel Impedance Value: 399 Ohm
Lead Channel Pacing Threshold Pulse Width: 0.4 ms
Lead Channel Sensing Intrinsic Amplitude: 1.5 mV
Lead Channel Sensing Intrinsic Amplitude: 13 mV
Lead Channel Sensing Intrinsic Amplitude: 13 mV
Lead Channel Setting Pacing Amplitude: 2.5 V
Lead Channel Setting Sensing Sensitivity: 2.8 mV
MDC IDC LEAD IMPLANT DT: 20171212
MDC IDC LEAD LOCATION: 753859
MDC IDC MSMT BATTERY REMAINING LONGEVITY: 79 mo
MDC IDC MSMT BATTERY VOLTAGE: 3 V
MDC IDC MSMT LEADCHNL RA IMPEDANCE VALUE: 323 Ohm
MDC IDC MSMT LEADCHNL RA SENSING INTR AMPL: 1.5 mV
MDC IDC MSMT LEADCHNL RV IMPEDANCE VALUE: 399 Ohm
MDC IDC MSMT LEADCHNL RV IMPEDANCE VALUE: 494 Ohm
MDC IDC MSMT LEADCHNL RV PACING THRESHOLD AMPLITUDE: 0.5 V
MDC IDC PG IMPLANT DT: 20171212
MDC IDC SET LEADCHNL RA PACING AMPLITUDE: 2 V
MDC IDC SET LEADCHNL RV PACING PULSEWIDTH: 0.4 ms
MDC IDC STAT BRADY AP VP PERCENT: 0.01 %

## 2017-09-01 NOTE — Progress Notes (Signed)
Remote pacemaker transmission.   

## 2017-09-02 ENCOUNTER — Encounter: Payer: Self-pay | Admitting: Cardiology

## 2017-09-12 ENCOUNTER — Other Ambulatory Visit: Payer: Self-pay

## 2017-09-12 ENCOUNTER — Encounter: Payer: Self-pay | Admitting: Emergency Medicine

## 2017-09-12 ENCOUNTER — Emergency Department
Admission: EM | Admit: 2017-09-12 | Discharge: 2017-09-12 | Disposition: A | Discharge status: Home / Self Care | Payer: Medicare Other | Attending: Emergency Medicine | Admitting: Emergency Medicine

## 2017-09-12 ENCOUNTER — Emergency Department: Payer: Medicare Other

## 2017-09-12 DIAGNOSIS — N189 Chronic kidney disease, unspecified: Secondary | ICD-10-CM | POA: Insufficient documentation

## 2017-09-12 DIAGNOSIS — I4891 Unspecified atrial fibrillation: Secondary | ICD-10-CM | POA: Insufficient documentation

## 2017-09-12 DIAGNOSIS — Z7901 Long term (current) use of anticoagulants: Secondary | ICD-10-CM | POA: Insufficient documentation

## 2017-09-12 DIAGNOSIS — Z79899 Other long term (current) drug therapy: Secondary | ICD-10-CM | POA: Insufficient documentation

## 2017-09-12 DIAGNOSIS — R05 Cough: Secondary | ICD-10-CM | POA: Diagnosis not present

## 2017-09-12 DIAGNOSIS — Z950 Presence of cardiac pacemaker: Secondary | ICD-10-CM | POA: Insufficient documentation

## 2017-09-12 DIAGNOSIS — I130 Hypertensive heart and chronic kidney disease with heart failure and stage 1 through stage 4 chronic kidney disease, or unspecified chronic kidney disease: Secondary | ICD-10-CM | POA: Diagnosis not present

## 2017-09-12 DIAGNOSIS — R531 Weakness: Secondary | ICD-10-CM | POA: Insufficient documentation

## 2017-09-12 DIAGNOSIS — I2510 Atherosclerotic heart disease of native coronary artery without angina pectoris: Secondary | ICD-10-CM | POA: Diagnosis not present

## 2017-09-12 DIAGNOSIS — Z7982 Long term (current) use of aspirin: Secondary | ICD-10-CM | POA: Diagnosis not present

## 2017-09-12 DIAGNOSIS — R059 Cough, unspecified: Secondary | ICD-10-CM

## 2017-09-12 DIAGNOSIS — E039 Hypothyroidism, unspecified: Secondary | ICD-10-CM | POA: Insufficient documentation

## 2017-09-12 DIAGNOSIS — I5032 Chronic diastolic (congestive) heart failure: Secondary | ICD-10-CM | POA: Diagnosis not present

## 2017-09-12 DIAGNOSIS — E785 Hyperlipidemia, unspecified: Secondary | ICD-10-CM | POA: Insufficient documentation

## 2017-09-12 LAB — TROPONIN I: Troponin I: <0.03 ng/mL (ref <0.03)

## 2017-09-12 LAB — BASIC METABOLIC PANEL
Anion gap: 10 (ref 5–15)
BUN: 12 mg/dL (ref 6–20)
CALCIUM: 9 mg/dL (ref 8.9–10.3)
CO2: 24 mmol/L (ref 22–32)
CREATININE: 1.05 mg/dL — AB (ref 0.44–1.00)
Chloride: 103 mmol/L (ref 101–111)
GFR calc Af Amer: 53 mL/min — ABNORMAL LOW (ref >60)
GFR calc non Af Amer: 46 mL/min — ABNORMAL LOW (ref >60)
GLUCOSE: 174 mg/dL — AB (ref 65–99)
Potassium: 4.1 mmol/L (ref 3.5–5.1)
Sodium: 137 mmol/L (ref 135–145)

## 2017-09-12 LAB — CBC WITH DIFFERENTIAL/PLATELET
Basophils Absolute: 0.1 10*3/uL (ref 0–0.1)
Basophils Relative: 1 %
Eosinophils Absolute: 0 10*3/uL (ref 0–0.7)
Eosinophils Relative: 0 %
HEMATOCRIT: 39 % (ref 35.0–47.0)
Hemoglobin: 13.1 g/dL (ref 12.0–16.0)
LYMPHS ABS: 0.6 10*3/uL — AB (ref 1.0–3.6)
LYMPHS PCT: 7 %
MCH: 29.7 pg (ref 26.0–34.0)
MCHC: 33.7 g/dL (ref 32.0–36.0)
MCV: 88.1 fL (ref 80.0–100.0)
MONO ABS: 0.4 10*3/uL (ref 0.2–0.9)
MONOS PCT: 4 %
NEUTROS ABS: 8.1 10*3/uL — AB (ref 1.4–6.5)
Neutrophils Relative %: 88 %
Platelets: 134 10*3/uL — ABNORMAL LOW (ref 150–440)
RBC: 4.42 MIL/uL (ref 3.80–5.20)
RDW: 14.1 % (ref 11.5–14.5)
WBC: 9.2 10*3/uL (ref 3.6–11.0)

## 2017-09-12 LAB — BRAIN NATRIURETIC PEPTIDE: B Natriuretic Peptide: 453 pg/mL — ABNORMAL HIGH (ref 0.0–100.0)

## 2017-09-12 MED ORDER — PREDNISONE 20 MG PO TABS: 40.0000 mg | ORAL_TABLET | Freq: Every day | ORAL | 0 refills | Status: AC

## 2017-09-12 MED ORDER — FUROSEMIDE 10 MG/ML IJ SOLN
20.0000 mg | Freq: Once | INTRAMUSCULAR | Status: AC
  Administered 2017-09-12: 20 mg via INTRAVENOUS
  Filled 2017-09-12: qty 4

## 2017-09-12 NOTE — ED Triage Notes (Signed)
Pt presents to ED via [facility] with c/o weakness and cough x 2 weeks. Per EMS has been intermittently better until this morning, she got up to get some breakfast and "just couldn't do it" so she went back to bed. Pt is alert and oriented, NAD noted at this time. EMS also reports that patient has pacemaker and valve replacement.

## 2017-09-12 NOTE — Discharge Instructions (Addendum)
I gave you an extra dose of lasix to see if your cough will improve. If the cough is getting worse, fill the prescription for the prednisone and take it as prescribed. Follow up with your doctor on Monday. Return to the ER if you have fever, chest pain, or shortness of breath.

## 2017-09-12 NOTE — ED Provider Notes (Signed)
[facility] Emergency Department Provider Note  ____________________________________________  Time seen: Approximately 11:11 AM  I have reviewed the triage vital signs and the nursing notes.   HISTORY  Chief Complaint Weakness and Cough   HPI Theresa Cain is a 84 y.o. female with a history of diastolic CHF, aortic valve replacement, atrial fibrillation on Coumadin, sick sinus syndrome s/p pacemaker, CAD, peptic ulcer disease, hypertension, hypothyroidism, chronic kidney disease who presents for evaluation of cough and generalized weakness.  Patient reports a cough productive of white phlegm for 2 weeks.  She reports progressively worsening and constant generalized weakness which became severe over the last few days.  Today she reports that she felt so weak she was barely able to walk.  She denies chest pain or shortness of breath, she denies any changes in her weight, she denies fever or chills, she denies nausea, vomiting, diarrhea, abdominal pain.  Past Medical History:  Diagnosis Date  . Acute on chronic diastolic CHF (congestive heart failure) ([place]) 02/22/2016  . Anemia   . Bradycardia   . Carotid artery occlusion   . Chronic diastolic CHF (congestive heart failure) ([place])   . Chronic kidney disease    elevated creatinne  . Coronary artery disease involving native coronary artery of native heart   . GERD (gastroesophageal reflux disease)   . HLD (hyperlipidemia)   . Hypertension   . Hypothyroidism   . Obesity   . Osteoarthritis   . Permanent atrial fibrillation ([place])   . Presence of permanent cardiac pacemaker   . PVD (peripheral vascular disease) ([place])   . 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 Severe aortic stenosis   . Stomach cancer ([place]) ~ 2013  . Vertigo     Patient Active Problem List   Diagnosis Date Noted  .  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 Chronic kidney disease   . Severe aortic stenosis   . Coronary artery disease involving native coronary artery of native heart   . Chronic diastolic CHF (congestive heart failure) ([place]) 03/23/2017  . HTN (hypertension) 03/23/2017  . Permanent atrial fibrillation ([place]) 03/23/2017  . GERD (gastroesophageal reflux disease) 03/23/2017    Past Surgical History:  Procedure Laterality Date  . CARDIOVASCULAR STRESS TEST    . CARDIOVERSION N/A 08/08/2016   Procedure: CARDIOVERSION;  Surgeon: Sueanne Margarita, MD;  Location: [facility] ENDOSCOPY;  Service: Cardiovascular;  Laterality: N/A;  . CAROTID ENDARTERECTOMY Right ~ 2005  . CATARACT EXTRACTION W/ INTRAOCULAR LENS IMPLANT Right   . CHOLECYSTECTOMY  ~ 2013   "part of her [facility] OR"  . EP IMPLANTABLE DEVICE N/A 03/04/2016   Procedure: Pacemaker Implant;  Surgeon: Will Meredith Leeds, MD;  Location: [place] CV LAB;  Service: Cardiovascular;  Laterality: N/A;  . EP IMPLANTABLE DEVICE N/A 03/05/2016   Procedure: Lead Revision/Repair;  Surgeon: Deboraha Sprang, MD;  Location: [place] CV LAB;  Service: Cardiovascular;  Laterality: N/A;  . INSERT / REPLACE / REMOVE PACEMAKER    . LEFT HEART CATH AND CORONARY ANGIOGRAPHY N/A 03/27/2017   Procedure: LEFT HEART CATH AND CORONARY ANGIOGRAPHY;  Surgeon: Minna Merritts, MD;  Location: [place] CV LAB;  Service: Cardiovascular;  Laterality: N/A;  . TEE WITHOUT CARDIOVERSION N/A 05/12/2017   Procedure: TRANSESOPHAGEAL ECHOCARDIOGRAM (TEE);  Surgeon: Sherren Mocha, MD;  Location: [place];  Service: Open Heart Surgery;  Laterality:  N/A;  . TRANSCATHETER AORTIC VALVE REPLACEMENT, TRANSFEMORAL N/A 05/12/2017   Procedure: TRANSCATHETER AORTIC VALVE REPLACEMENT, TRANSFEMORAL using a 9mm Edwards Sapien 3 Aortic Valve;  Surgeon: Sherren Mocha, MD;  Location: [place];  Service: Open Heart Surgery;  Laterality: N/A;  . US ECHOCARDIOGRAPHY    .  VAGINAL HYSTERECTOMY    . WHIPPLE PROCEDURE  ~ 2013    Prior to Admission medications   Medication Sig Start Date End Date Taking? Authorizing Provider  acetaminophen (TYLENOL) 500 MG tablet Take 1,000 mg by mouth every 6 (six) hours as needed for moderate pain.    [provider]  apixaban (ELIQUIS) 5 MG TABS tablet Take 1 tablet (5 mg total) by mouth 2 (two) times daily. 05/14/17   Eileen Stanford, PA-C  aspirin EC 81 MG EC tablet Take 1 tablet (81 mg total) by mouth daily. 05/14/17   Eileen Stanford, PA-C  clindamycin (CLEOCIN) 300 MG capsule Take 30-60 minutes prior to dental visit. 06/09/17   Sherren Mocha, MD  diltiazem (CARDIZEM CD) 180 MG 24 hr capsule Take 1 capsule (180 mg total) by mouth daily. 03/27/17   Vaughan Basta, MD  escitalopram (LEXAPRO) 10 MG tablet Take 10 mg by mouth at bedtime.     [provider]  ferrous sulfate 325 (65 FE) MG tablet Take 325 mg by mouth daily with breakfast.    [provider]  gabapentin (NEURONTIN) 600 MG tablet Take 600 mg by mouth 2 (two) times daily.     [provider]  levothyroxine (SYNTHROID) 50 MCG tablet Take 1 tablet (50 mcg total) by mouth daily. 03/27/17 03/27/18  Vaughan Basta, MD  omeprazole (PRILOSEC) 20 MG capsule Take 20 mg by mouth daily.    [provider]  potassium chloride (K-DUR) 10 MEQ tablet Take 1 tablet (10 mEq total) by mouth 2 (two) times daily. 03/27/17   Vaughan Basta, MD  pravastatin (PRAVACHOL) 40 MG tablet Take 40 mg by mouth every evening.     [provider]  predniSONE (DELTASONE) 20 MG tablet Take 2 tablets (40 mg total) by mouth daily for 5 days. 09/12/17 09/17/17  Rudene Re, MD  torsemide (DEMADEX) 20 MG tablet Take 1 tablet (20 mg total) by mouth daily. Take one additional tablet daily as needed for swelling. 04/01/17   Eileen Stanford, PA-C  Vitamin D, Ergocalciferol, (DRISDOL) 50000 units CAPS capsule Take 50,000 Units by  mouth 2 (two) times a week. Take on Sunday and Thursday    [provider]    Allergies Penicillins and Sulfa antibiotics  Family History  Problem Relation Age of Onset  . Heart disease Sister     Social History Social History   Tobacco Use  . Smoking status: Never Smoker  . Smokeless tobacco: Never Used  Substance Use Topics  . Alcohol use: No  . Drug use: No    Review of Systems  Constitutional: Negative for fever. + Generalized weakness cough Eyes: Negative for visual changes. ENT: Negative for sore throat. Neck: No neck pain  Cardiovascular: Negative for chest pain. Respiratory: Negative for shortness of breath. + cough Gastrointestinal: Negative for abdominal pain, vomiting or diarrhea. Genitourinary: Negative for dysuria. Musculoskeletal: Negative for back pain. Skin: Negative for rash. Neurological: Negative for headaches, weakness or numbness. Psych: No SI or HI  ____________________________________________   PHYSICAL EXAM:  VITAL SIGNS: ED Triage Vitals  Enc Vitals Group     BP 09/12/17 1104 (!) 179/89     Pulse Rate  09/12/17 1104 71     Resp 09/12/17 1104 (!) 23     Temp 09/12/17 1104 (!) 97.5 F (36.4 C)     Temp Source 09/12/17 1104 Oral     SpO2 09/12/17 1104 98 %     Weight 09/12/17 1102 208 lb (94.3 kg)     Height 09/12/17 1102 6' (1.829 m)     Head Circumference --      Peak Flow --      Pain Score --      Pain Loc --      Pain Edu? --      Excl. in [place]? --     Constitutional: Alert and oriented. Well appearing and in no apparent distress. HEENT:      Head: Normocephalic and atraumatic.         Eyes: Conjunctivae are normal. Sclera is non-icteric.       Mouth/Throat: Mucous membranes are moist.       Neck: Supple with no signs of meningismus. Cardiovascular: Regular rate and rhythm. No murmurs, gallops, or rubs. 2+ symmetrical distal pulses are present in all extremities. No JVD. Respiratory: Normal respiratory effort. Lungs  are clear to auscultation bilaterally. No wheezes, crackles, or rhonchi.  Gastrointestinal: Soft, non tender, and non distended with positive bowel sounds. No rebound or guarding. Musculoskeletal: Nontender with normal range of motion in all extremities. No edema, cyanosis, or erythema of extremities. Neurologic: Normal speech and language. Face is symmetric. Moving all extremities. No gross focal neurologic deficits are appreciated. Skin: Skin is warm, dry and intact. No rash noted. Psychiatric: Mood and affect are normal. Speech and behavior are normal.  ____________________________________________   LABS (all labs ordered are listed, but only abnormal results are displayed)  Labs Reviewed  CBC WITH DIFFERENTIAL/PLATELET - Abnormal; Notable for the following components:      Result Value   Platelets 134 (*)    Neutro Abs 8.1 (*)    Lymphs Abs 0.6 (*)    All other components within normal limits  BASIC METABOLIC PANEL - Abnormal; Notable for the following components:   Glucose, Bld 174 (*)    Creatinine, Ser 1.05 (*)    GFR calc non Af Amer 46 (*)    GFR calc Af Amer 53 (*)    All other components within normal limits  BRAIN NATRIURETIC PEPTIDE - Abnormal; Notable for the following components:   B Natriuretic Peptide 453.0 (*)    All other components within normal limits  TROPONIN I   ____________________________________________  EKG  ED ECG REPORT I, Rudene Re, the attending physician, personally viewed and interpreted this ECG.  Intermittent ventricular paced rhythm, rate of 74, prolonged QTC, normal axis, T wave inversions in inferior and lateral leads on non paced beats, no ST elevations or depressions.  No significant changes when compared to prior. ____________________________________________  RADIOLOGY  I have personally reviewed the images performed during this visit and I agree with the Radiologist's read.   Interpretation by Radiologist:  Dg Chest 2  View  Result Date: 09/12/2017 CLINICAL DATA:  Cough and weakness. EXAM: CHEST - 2 VIEW COMPARISON:  05/12/2017 FINDINGS: A pacemaker remains in place with leads terminating over the right atrium and right ventricle. Sequelae of transcatheter aortic valve replacement are again identified. The cardiac silhouette is borderline to mildly enlarged. Right jugular catheter has been removed. The lungs are better inflated than on the prior study. There is mild pulmonary vascular congestion which has decreased. No overt pulmonary  edema, pleural effusion, or pneumothorax is identified. No acute osseous abnormality is seen. IMPRESSION: Mild pulmonary vascular congestion, decreased from prior. Electronically Signed   By: Logan Bores M.D.   On: 09/12/2017 11:57      ____________________________________________   PROCEDURES  Procedure(s) performed: None Procedures Critical Care performed:  None ____________________________________________   INITIAL IMPRESSION / ASSESSMENT AND PLAN / ED COURSE  84 y.o. female with a history of diastolic CHF, aortic valve replacement, atrial fibrillation on Coumadin, sick sinus syndrome s/p pacemaker, CAD, peptic ulcer disease, hypertension, hypothyroidism, chronic kidney disease who presents for evaluation of cough and generalized weakness x 2 weeks.  Patient is well-appearing, normal work of breathing, normal sats, lungs are clear to auscultation, she looks euvolemic on exam.  EKG showing demand paced rhythm with no acute ischemic changes.  Differential diagnoses including viral URI versus pneumonia.  Patient has no chest pain or shortness of breath therefore less likely to be ACS.  She is on Eliquis making PE unlikely.  Patient looks euvolemic with no clinical signs of CHF exacerbation.  She has no history of smoking or COPD.  Chest x-ray is pending.  Will check labs to rule out dehydration, electrolyte abnormalities, acute on chronic kidney injury which could cause her  generalized weakness.    _________________________ 2:19 PM on 09/12/2017 -----------------------------------------  Chest x-ray with no evidence of pneumonia, no fever, normal work of breathing, normal sats, normal white count.  Will hold off any antibiotics at this time.  BNP slightly elevated at 453 with chest x-ray showing mild vascular congestion.  Patient was given a dose of IV Lasix.  Recommended close follow-up with primary care doctor on Monday.  Discussed return precautions for fever chest pain or shortness of breath.  At this time I believe patient is safe for discharge.   As part of my medical decision making, I reviewed the following data within the [place] notes reviewed and incorporated, Labs reviewed , EKG interpreted , Old EKG reviewed, Old chart reviewed, Radiograph reviewed , Notes from prior ED visits and [place] Controlled Substance Database    Pertinent labs & imaging results that were available during my care of the patient were reviewed by me and considered in my medical decision making (see chart for details).    ____________________________________________   FINAL CLINICAL IMPRESSION(S) / ED DIAGNOSES  Final diagnoses:  Cough      NEW MEDICATIONS STARTED DURING THIS VISIT:  ED Discharge Orders        Ordered    predniSONE (DELTASONE) 20 MG tablet  Daily     09/12/17 1416       Note:  This document was prepared using Dragon voice recognition software and may include unintentional dictation errors.    Alfred Levins, [place], MD 09/12/17 [phone]

## 2017-12-01 ENCOUNTER — Ambulatory Visit (INDEPENDENT_AMBULATORY_CARE_PROVIDER_SITE_OTHER): Payer: Medicare Other | Admitting: *Deleted

## 2017-12-01 ENCOUNTER — Telehealth: Payer: Self-pay

## 2017-12-01 DIAGNOSIS — I495 Sick sinus syndrome: Secondary | ICD-10-CM | POA: Diagnosis not present

## 2017-12-01 NOTE — Progress Notes (Signed)
Remote pacemaker transmission.   

## 2017-12-01 NOTE — Telephone Encounter (Signed)
Confirmed remote transmission w/ pt daughter.   

## 2017-12-22 LAB — CUP PACEART REMOTE DEVICE CHECK
Brady Statistic AP VP Percent: 0.01 %
Brady Statistic AP VS Percent: 0 %
Brady Statistic AS VP Percent: 95.72 %
Implantable Lead Implant Date: 20171212
Implantable Lead Location: 753860
Lead Channel Impedance Value: 399 Ohm
Lead Channel Pacing Threshold Pulse Width: 0.4 ms
Lead Channel Sensing Intrinsic Amplitude: 1.375 mV
Lead Channel Sensing Intrinsic Amplitude: 11.75 mV
Lead Channel Sensing Intrinsic Amplitude: 11.75 mV
Lead Channel Setting Pacing Amplitude: 2 V
Lead Channel Setting Pacing Amplitude: 2.5 V
Lead Channel Setting Sensing Sensitivity: 2.8 mV
MDC IDC LEAD IMPLANT DT: 20171212
MDC IDC LEAD LOCATION: 753859
MDC IDC MSMT BATTERY REMAINING LONGEVITY: 72 mo
MDC IDC MSMT BATTERY VOLTAGE: 3 V
MDC IDC MSMT LEADCHNL RA IMPEDANCE VALUE: 323 Ohm
MDC IDC MSMT LEADCHNL RA SENSING INTR AMPL: 1.375 mV
MDC IDC MSMT LEADCHNL RV IMPEDANCE VALUE: 399 Ohm
MDC IDC MSMT LEADCHNL RV IMPEDANCE VALUE: 494 Ohm
MDC IDC MSMT LEADCHNL RV PACING THRESHOLD AMPLITUDE: 0.5 V
MDC IDC PG IMPLANT DT: 20171212
MDC IDC SESS DTM: 20190910145225
MDC IDC SET LEADCHNL RV PACING PULSEWIDTH: 0.4 ms
MDC IDC STAT BRADY AS VS PERCENT: 4.27 %
MDC IDC STAT BRADY RA PERCENT PACED: 0.01 %
MDC IDC STAT BRADY RV PERCENT PACED: 95.76 %

## 2018-01-22 ENCOUNTER — Other Ambulatory Visit: Payer: Self-pay | Admitting: *Deleted

## 2018-01-22 MED ORDER — APIXABAN 5 MG PO TABS: 5.0000 mg | ORAL_TABLET | Freq: Two times a day (BID) | ORAL | 8 refills | Status: DC

## 2018-01-22 NOTE — Telephone Encounter (Signed)
Pt last saw Dr Rockey Situ 08/25/17, last labs 09/12/17 Creat 1.05, age 84, weight 94.3kg, based on specified criteria pt is on appropriate dosage of Eliquis 5mg  BID.  Will refill rx.

## 2018-03-02 ENCOUNTER — Ambulatory Visit (INDEPENDENT_AMBULATORY_CARE_PROVIDER_SITE_OTHER): Payer: Medicare Other

## 2018-03-02 ENCOUNTER — Telehealth: Payer: Self-pay | Admitting: Cardiology

## 2018-03-02 DIAGNOSIS — I495 Sick sinus syndrome: Secondary | ICD-10-CM

## 2018-03-02 NOTE — Telephone Encounter (Signed)
LMOVM reminding pt to send remote transmission.   

## 2018-03-03 NOTE — Progress Notes (Signed)
Remote pacemaker transmission.   

## 2018-04-11 LAB — CUP PACEART REMOTE DEVICE CHECK
Battery Voltage: 3 V
Brady Statistic AP VP Percent: 0.01 %
Brady Statistic AS VS Percent: 6.3 %
Brady Statistic RA Percent Paced: 0 %
Brady Statistic RV Percent Paced: 93.77 %
Date Time Interrogation Session: 20191211042442
Implantable Lead Implant Date: 20171212
Implantable Lead Location: 753859
Implantable Lead Model: 5076
Implantable Pulse Generator Implant Date: 20171212
Lead Channel Impedance Value: 418 Ohm
Lead Channel Impedance Value: 494 Ohm
Lead Channel Pacing Threshold Amplitude: 0.5 V
Lead Channel Sensing Intrinsic Amplitude: 1 mV
Lead Channel Sensing Intrinsic Amplitude: 1 mV
Lead Channel Sensing Intrinsic Amplitude: 12.625 mV
Lead Channel Setting Sensing Sensitivity: 2.8 mV
MDC IDC LEAD IMPLANT DT: 20171212
MDC IDC LEAD LOCATION: 753860
MDC IDC MSMT BATTERY REMAINING LONGEVITY: 70 mo
MDC IDC MSMT LEADCHNL RA IMPEDANCE VALUE: 342 Ohm
MDC IDC MSMT LEADCHNL RA IMPEDANCE VALUE: 437 Ohm
MDC IDC MSMT LEADCHNL RV PACING THRESHOLD PULSEWIDTH: 0.4 ms
MDC IDC MSMT LEADCHNL RV SENSING INTR AMPL: 12.625 mV
MDC IDC SET LEADCHNL RA PACING AMPLITUDE: 2 V
MDC IDC SET LEADCHNL RV PACING AMPLITUDE: 2.5 V
MDC IDC SET LEADCHNL RV PACING PULSEWIDTH: 0.4 ms
MDC IDC STAT BRADY AP VS PERCENT: 0 %
MDC IDC STAT BRADY AS VP PERCENT: 93.7 %

## 2018-05-19 ENCOUNTER — Other Ambulatory Visit: Payer: Self-pay | Admitting: Physician Assistant

## 2018-05-19 DIAGNOSIS — Z952 Presence of prosthetic heart valve: Secondary | ICD-10-CM

## 2018-05-19 NOTE — Progress Notes (Signed)
[facility] [place]                                       Cardiology Office Note    Date:  05/21/2018   ID:  Bedie Dominey, DOB [birth year], MRN [id number]  PCP:  Kendrick Ranch, MD  Cardiologist:  Dr. Rockey Situ / Dr. Curt Bears (EP) / Dr. Burt Knack & Dr. Roxy Manns (TAVR)  CC: 1 year s/p TAVR  History of Present Illness:  Theresa Cain is a 85 y.o. 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   Her cardiac history dates back to 2014 when she underwent a Whipple procedure at [facility]. She had a prolonged hospitalization and soon after discharge suffered aMI. In 2017 she developed dizzy spells and a Holter monitor showed tachy-bradysyndrome. She underwent insertion of a permanent pacemaker in December 2017 with subsequent revision due to lead dislodgment. She did well until she was recently admitted to [facility] 03/23/17-03/27/17 with shortness of breath, diaphoresis, and chest discomfort. She was found to be in ventricular tachycardia with torsades with frequent PVCs and runs of nonsustained ventricular tachycardia. She was noted to have a very low potassium level which was replaced. She underwent cardiac catheterization which showed multivessel coronary disease with 40% stenosis of the LAD, high-grade stenosis of a diagonal branch, and 75% stenosis of the distal right coronary artery. The aortic valve could not be crossed. An echocardiogram at that time showed severe aortic stenosis with a mean transvalvular gradient of 49 mmHg. Left ventricular systolic function was normal. There was mild to moderate mitral regurgitation and moderate tricuspid regurgitation with moderate  pulmonary hypertension. Her coronary disease was treated medically and she was seen by the multidisciplinary valve team for consideration of TAVRto treat her severe aortic stenosis.   She underwent  successful TAVR with a 41mm Edwards Sapien 3 THV via the TF approach on 05/12/17. The procedure was uncomplicated and the patient was discharged home the following day.  She is chronically anticoagulated with apixaban for permanent atrial fibrillation.1 month echo showednormal LV systolic function, normal function of her transcatheter aortic valve with a mean gradient of 9 mmHg and trivial paravalvular regurgitation.  Today she presents to clinic for follow up. No CP or SOB. No LE edema, orthopnea or PND. No dizziness or syncope. No blood in stool or urine. No palpitations. She has had a lot of improvement since having her TAVR and says she is so glad she did it.   Past Medical History:  Diagnosis Date  . Acute on chronic diastolic CHF (congestive heart failure) ([place]) 02/22/2016  . Anemia   . Bradycardia   . Carotid artery occlusion   . Chronic diastolic CHF (congestive heart failure) ([place])   . Chronic kidney disease    elevated creatinne  . Coronary artery disease involving native coronary artery of native heart   . GERD (gastroesophageal reflux disease)   . HLD (hyperlipidemia)   . Hypertension   . Hypothyroidism   . Obesity   . Osteoarthritis   . Permanent atrial fibrillation   . Presence of permanent cardiac pacemaker   . PVD (peripheral vascular disease) ([place])   . 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  23 mm Edwards Sapien 3 transcatheter heart valve placed via percutaneous right transfemoral approach   . Severe aortic stenosis   . Stomach cancer ([place]) ~ 2013  . Vertigo     Past Surgical History:  Procedure Laterality Date  . CARDIOVASCULAR STRESS TEST    . CARDIOVERSION N/A 08/08/2016   Procedure: CARDIOVERSION;  Surgeon: Sueanne Margarita, MD;  Location:  [facility] ENDOSCOPY;  Service: Cardiovascular;  Laterality: N/A;  . CAROTID ENDARTERECTOMY Right ~ 2005  . CATARACT EXTRACTION W/ INTRAOCULAR LENS IMPLANT Right   . CHOLECYSTECTOMY  ~ 2013   "part of her Whipple OR"  . EP IMPLANTABLE DEVICE N/A 03/04/2016   Procedure: Pacemaker Implant;  Surgeon: Will Meredith Leeds, MD;  Location: [place] [facility];  Service: Cardiovascular;  Laterality: N/A;  . EP IMPLANTABLE DEVICE N/A 03/05/2016   Procedure: Lead Revision/Repair;  Surgeon: Deboraha Sprang, MD;  Location: [place] [facility];  Service: Cardiovascular;  Laterality: N/A;  . INSERT / REPLACE / REMOVE PACEMAKER    . LEFT HEART CATH AND CORONARY ANGIOGRAPHY N/A 03/27/2017   Procedure: LEFT HEART CATH AND CORONARY ANGIOGRAPHY;  Surgeon: Minna Merritts, MD;  Location: [place] [facility];  Service: Cardiovascular;  Laterality: N/A;  . TEE WITHOUT CARDIOVERSION N/A 05/12/2017   Procedure: TRANSESOPHAGEAL ECHOCARDIOGRAM (TEE);  Surgeon: Sherren Mocha, MD;  Location: [place];  Service: Open Heart Surgery;  Laterality: N/A;  . TRANSCATHETER AORTIC VALVE REPLACEMENT, TRANSFEMORAL N/A 05/12/2017   Procedure: TRANSCATHETER AORTIC VALVE REPLACEMENT, TRANSFEMORAL using a 99mm Edwards Sapien 3 Aortic Valve;  Surgeon: Sherren Mocha, MD;  Location: [place];  Service: Open Heart Surgery;  Laterality: N/A;  . US ECHOCARDIOGRAPHY    . VAGINAL HYSTERECTOMY    . WHIPPLE PROCEDURE  ~ 2013    Current Medications: Outpatient Medications Prior to Visit  Medication Sig Dispense Refill  . acetaminophen (TYLENOL) 500 MG tablet Take 1,000 mg by mouth every 6 (six) hours as needed for moderate pain.    Marland Kitchen apixaban (ELIQUIS) 5 MG TABS tablet Take 1 tablet (5 mg total) by mouth 2 (two) times daily. 60 tablet 8  . clindamycin (CLEOCIN) 300 MG capsule Take 30-60 minutes prior to dental visit. 2 capsule 6  . diltiazem (CARDIZEM CD) 180 MG 24 hr capsule Take 1 capsule (180 mg total) by mouth daily. 90 capsule 3  . escitalopram  (LEXAPRO) 10 MG tablet Take 10 mg by mouth at bedtime.     . ferrous sulfate 325 (65 FE) MG tablet Take 325 mg by mouth daily with breakfast.    . gabapentin (NEURONTIN) 600 MG tablet Take 600 mg by mouth 2 (two) times daily.     Marland Kitchen omeprazole (PRILOSEC) 20 MG capsule Take 20 mg by mouth daily.    . potassium chloride (K-DUR) 10 MEQ tablet Take 1 tablet (10 mEq total) by mouth 2 (two) times daily. 60 tablet 0  . pravastatin (PRAVACHOL) 40 MG tablet Take 40 mg by mouth every evening.     . torsemide (DEMADEX) 20 MG tablet Take 1 tablet (20 mg total) by mouth daily. Take one additional tablet daily as needed for swelling. 45 tablet 11  . Vitamin D, Ergocalciferol, (DRISDOL) 50000 units CAPS capsule Take 50,000 Units by mouth 2 (two) times a week. Take on Sunday and Thursday    . levothyroxine (SYNTHROID) 50 MCG tablet Take 1 tablet (50 mcg total) by mouth daily. 30 tablet 0  . aspirin EC 81 MG EC tablet Take 1 tablet (81  mg total) by mouth daily. (Patient not taking: Reported on 05/20/2018)     No facility-administered medications prior to visit.      Allergies:   Penicillins and Sulfa antibiotics   Social History   Socioeconomic History  . Marital status: Widowed    Spouse name: Not on file  . Number of children: Not on file  . Years of education: Not on file  . Highest education level: Not on file  Occupational History  . Not on file  Social Needs  . Financial resource strain: Not on file  . Food insecurity:    Worry: Not on file    Inability: Not on file  . Transportation needs:    Medical: Not on file    Non-medical: Not on file  Tobacco Use  . Smoking status: Never Smoker  . Smokeless tobacco: Never Used  Substance and Sexual Activity  . Alcohol use: No  . Drug use: No  . Sexual activity: Never  Lifestyle  . Physical activity:    Days per week: Not on file    Minutes per session: Not on file  . Stress: Not on file  Relationships  . Social connections:    Talks on  phone: Not on file    Gets together: Not on file    Attends religious service: Not on file    Active member of club or organization: Not on file    Attends meetings of clubs or organizations: Not on file    Relationship status: Not on file  Other Topics Concern  . Not on file  Social History Narrative  . Not on file     Family History:  The patient's family history includes Heart disease in her sister.      ROS:   Please see the history of present illness.    ROS All other systems reviewed and are negative.   PHYSICAL EXAM:   VS:  BP (!) 160/80   Pulse 63   Ht 6' (1.829 m)   SpO2 91%   BMI 28.21 kg/m    GEN: Well nourished, well developed, in no acute distress HEENT: normal Neck: no JVD or masses Cardiac: RRR; 3/6 SEM. No rubs, or gallops. Chronic bilateral LE edema L>R Respiratory:  clear to auscultation bilaterally, normal work of breathing GI: soft, nontender, nondistended, + BS MS: no deformity or atrophy Skin: warm and dry, no rash Neuro:  Alert and Oriented x 3, Strength and sensation are intact Psych: euthymic mood, full affect   Wt Readings from Last 3 Encounters:  09/12/17 208 lb (94.3 kg)  08/25/17 208 lb (94.3 kg)  06/19/17 206 lb 6.4 oz (93.6 kg)      Studies/Labs Reviewed:   EKG:  EKG is NOT ordered today.   Recent Labs: 09/12/2017: B Natriuretic Peptide 453.0; BUN 12; Creatinine, Ser 1.05; Hemoglobin 13.1; Platelets 134; Potassium 4.1; Sodium 137   Lipid Panel No results found for: CHOL, TRIG, HDL, CHOLHDL, VLDL, LDLCALC, LDLDIRECT  Additional studies/ records that were reviewed today include:  TAVR OPERATIVE NOTE   Date of Procedure:05/12/2017  Preoperative Diagnosis:Severe Aortic Stenosis   Postoperative Diagnosis:Same   Procedure:   Transcatheter Aortic Valve Replacement - Percutaneous Transfemoral Approach Edwards Sapien 3 THV (size 31mm, model # 9600TFX, serial #   [id number])  Co-Surgeons:Clarence H. Roxy Manns, MD and Sherren Mocha, MD  Pre-operative Echo Findings: ? Severe aortic stenosis ? Normalleft ventricular systolic function  Post-operative Echo Findings: ? Noparavalvular leak ? normalleft ventricular systolic function  __________________  Echo 06/20/18 Study Conclusions - Left ventricle: The cavity size was normal. Systolic function was   normal. The estimated ejection fraction was in the range of 60%   to 65%. Wall motion was normal; there were no regional wall   motion abnormalities. Doppler parameters are consistent with high   ventricular filling pressure. - Aortic valve: S/P 21mm Edwards Sapien bioprosthetic TAVR which   appears to be functioning normally. There is trivial perivalvular   leak but no aortic stenosis. Mean gradient (S): 12 mm Hg. Valve   area (VTI): 1.89 cm^2. Valve area (Vmax): 1.69 cm^2. Valve area   (Vmean): 1.89 cm^2. - Aorta: Aortic root dimension: 38 mm (ED). Ascending aortic   diameter: 41 mm (S). - Aortic root: The aortic root was mildly dilated. - Ascending aorta: The ascending aorta was mildly dilated. - Mitral valve: Calcified annulus. There was mild regurgitation. - Left atrium: The atrium was severely dilated. - Right ventricle: The cavity size was moderately dilated. Wall   thickness was normal. - Right atrium: The atrium was severely dilated. - Pulmonary arteries: PA peak pressure: 58 mm Hg (S). Impressions: - Normal LVF, stable TAVR with trivial perivalvular leak, severe   biatrial enlargement, moderate pulmonary HTN. No significant   change from prior echo. The right ventricular systolic pressure   was increased consistent with moderate pulmonary hypertension.  _______________  Echo 05/20/18 IMPRESSIONS  1. The left ventricle has hyperdynamic systolic function, with an ejection fraction of >65%. The cavity size was normal. There is mildly increased left  ventricular wall thickness. Left ventricular diastology could not be evaluated secondary to atrial  fibrillation.  2. The right ventricle has mildly reduced systolic function. The cavity was mildly enlarged. There is no increase in right ventricular wall thickness.  3. Left atrial size was severely dilated.  4. Right atrial size was moderately dilated.  5. Moderate mitral annular dilatation.  6. The mitral valve is degenerative. Mild thickening of the mitral valve leaflet.  7. The tricuspid valve is normal in structure. Tricuspid valve regurgitation is mild-moderate.  8. Aortic valve regurgitation mild paravalvular leak.  9. There is mild to moderate dilatation of the ascending aorta measuring 43 mm. 10. Cannot exclude PFO. 11. - TAVR: 23 mm Edwards Sapien 3 THV - well-seated, mild paravalvular leak, no obstruction - mean gradient of 23 mmHg. 12. When compared to the prior study: 06/19/2017: LVEF 60-65%, trivial perivalvular leak, 12 mmHg mean gradient across TAVR valve.  ASSESSMENT & PLAN:   Severe AS s/p TAVR: echo today shows EF 65%, mild-mod ascending aortic dilation (43 mm), normally funtioning TAVR with mild PVL and increased gradients (mean gradient of 46mmHg). Reviewed echo with Dr. Burt Knack who felt that gradients were overestimated. She is doing well clinically with NYHA class I symptoms. SBE prophylaxis discussed; she has clindamycin. She will continue on Eliquis for atrial fibrillation   HTN: BP elevated today. I have asked her to keep an eye on her BP and let us know if consistently >140/90 and we can make adjustments to her medications  TAA: echo today showed mild-mod ascending aortic dilation (43 mm). Will defer follow up to primary cardiologist. Would think conservative management given her age. Discuss importance of good BP control  Chronic Afib: rate well controlled. S/p PPM for tachy brady. Continue Eliquis  Medication Adjustments/Labs and Tests Ordered: Current medicines are  reviewed at length with the patient today.  Concerns regarding medicines are outlined above.  Medication changes, Labs and Tests  ordered today are listed in the Patient Instructions below. Patient Instructions  Medication Instructions:  No changes If you need a refill on your cardiac medications before your next appointment, please call your pharmacy.   Lab work: none If you have labs (blood work) drawn today and your tests are completely normal, you will receive your results only by: Marland Kitchen MyChart Message (if you have MyChart) OR . A paper copy in the mail If you have any lab test that is abnormal or we need to change your treatment, we will call you to review the results.  Testing/Procedures: none  Follow-Up: Your physician recommends that you schedule a follow-up appointment in: as needed with K. Grandville Silos, PA-C   Any Other Special Instructions Will Be Listed Below (If Applicable).       Mable Fill, PA-C  05/21/2018 8:39 AM    [facility] [place], [place], [place]  [address] Phone: [phone]; Fax: [phone]

## 2018-05-20 ENCOUNTER — Encounter: Payer: Self-pay | Admitting: Physician Assistant

## 2018-05-20 ENCOUNTER — Ambulatory Visit (HOSPITAL_COMMUNITY): Payer: Medicare Other | Attending: Internal Medicine

## 2018-05-20 ENCOUNTER — Ambulatory Visit (INDEPENDENT_AMBULATORY_CARE_PROVIDER_SITE_OTHER): Payer: Medicare Other | Admitting: Physician Assistant

## 2018-05-20 VITALS — BP 160/80 | HR 63 | Ht 72.0 in

## 2018-05-20 DIAGNOSIS — I4821 Permanent atrial fibrillation: Secondary | ICD-10-CM

## 2018-05-20 DIAGNOSIS — Z952 Presence of prosthetic heart valve: Secondary | ICD-10-CM | POA: Insufficient documentation

## 2018-05-20 DIAGNOSIS — I712 Thoracic aortic aneurysm, without rupture, unspecified: Secondary | ICD-10-CM

## 2018-05-20 DIAGNOSIS — I10 Essential (primary) hypertension: Secondary | ICD-10-CM

## 2018-05-20 NOTE — Patient Instructions (Signed)
Medication Instructions:  No changes If you need a refill on your cardiac medications before your next appointment, please call your pharmacy.   Lab work: none If you have labs (blood work) drawn today and your tests are completely normal, you will receive your results only by: Marland Kitchen MyChart Message (if you have MyChart) OR . A paper copy in the mail If you have any lab test that is abnormal or we need to change your treatment, we will call you to review the results.  Testing/Procedures: none  Follow-Up: Your physician recommends that you schedule a follow-up appointment in: as needed with K. Grandville Silos, PA-C   Any Other Special Instructions Will Be Listed Below (If Applicable).

## 2018-06-01 ENCOUNTER — Ambulatory Visit (INDEPENDENT_AMBULATORY_CARE_PROVIDER_SITE_OTHER): Payer: Medicare Other | Admitting: *Deleted

## 2018-06-01 DIAGNOSIS — I495 Sick sinus syndrome: Secondary | ICD-10-CM

## 2018-06-02 LAB — CUP PACEART REMOTE DEVICE CHECK
Battery Voltage: 3 V
Brady Statistic AP VP Percent: 0 %
Brady Statistic RA Percent Paced: 0 %
Date Time Interrogation Session: 20200310110141
Implantable Lead Implant Date: 20171212
Implantable Lead Location: 753859
Implantable Lead Model: 5076
Lead Channel Impedance Value: 399 Ohm
Lead Channel Pacing Threshold Amplitude: 0.375 V
Lead Channel Sensing Intrinsic Amplitude: 1.25 mV
Lead Channel Sensing Intrinsic Amplitude: 1.25 mV
Lead Channel Sensing Intrinsic Amplitude: 11.375 mV
Lead Channel Setting Pacing Pulse Width: 0.4 ms
Lead Channel Setting Sensing Sensitivity: 2.8 mV
MDC IDC LEAD IMPLANT DT: 20171212
MDC IDC LEAD LOCATION: 753860
MDC IDC MSMT BATTERY REMAINING LONGEVITY: 67 mo
MDC IDC MSMT LEADCHNL RA IMPEDANCE VALUE: 323 Ohm
MDC IDC MSMT LEADCHNL RA IMPEDANCE VALUE: 418 Ohm
MDC IDC MSMT LEADCHNL RV IMPEDANCE VALUE: 456 Ohm
MDC IDC MSMT LEADCHNL RV PACING THRESHOLD PULSEWIDTH: 0.4 ms
MDC IDC MSMT LEADCHNL RV SENSING INTR AMPL: 11.375 mV
MDC IDC PG IMPLANT DT: 20171212
MDC IDC SET LEADCHNL RA PACING AMPLITUDE: 2 V
MDC IDC SET LEADCHNL RV PACING AMPLITUDE: 2.5 V
MDC IDC STAT BRADY AP VS PERCENT: 0 %
MDC IDC STAT BRADY AS VP PERCENT: 90.58 %
MDC IDC STAT BRADY AS VS PERCENT: 9.42 %
MDC IDC STAT BRADY RV PERCENT PACED: 90.64 %

## 2018-06-08 ENCOUNTER — Encounter: Payer: Self-pay | Admitting: Cardiology

## 2018-06-19 IMAGING — DX DG CHEST 1V PORT
1 series · 1 of 1 positions shown · non-contrast
Comparison: 03/06/2016

CLINICAL DATA: Shortness of breath and weakness since last night.
Sweating to the back of head and neck.

EXAM:
PORTABLE CHEST 1 VIEW

[chest ap]
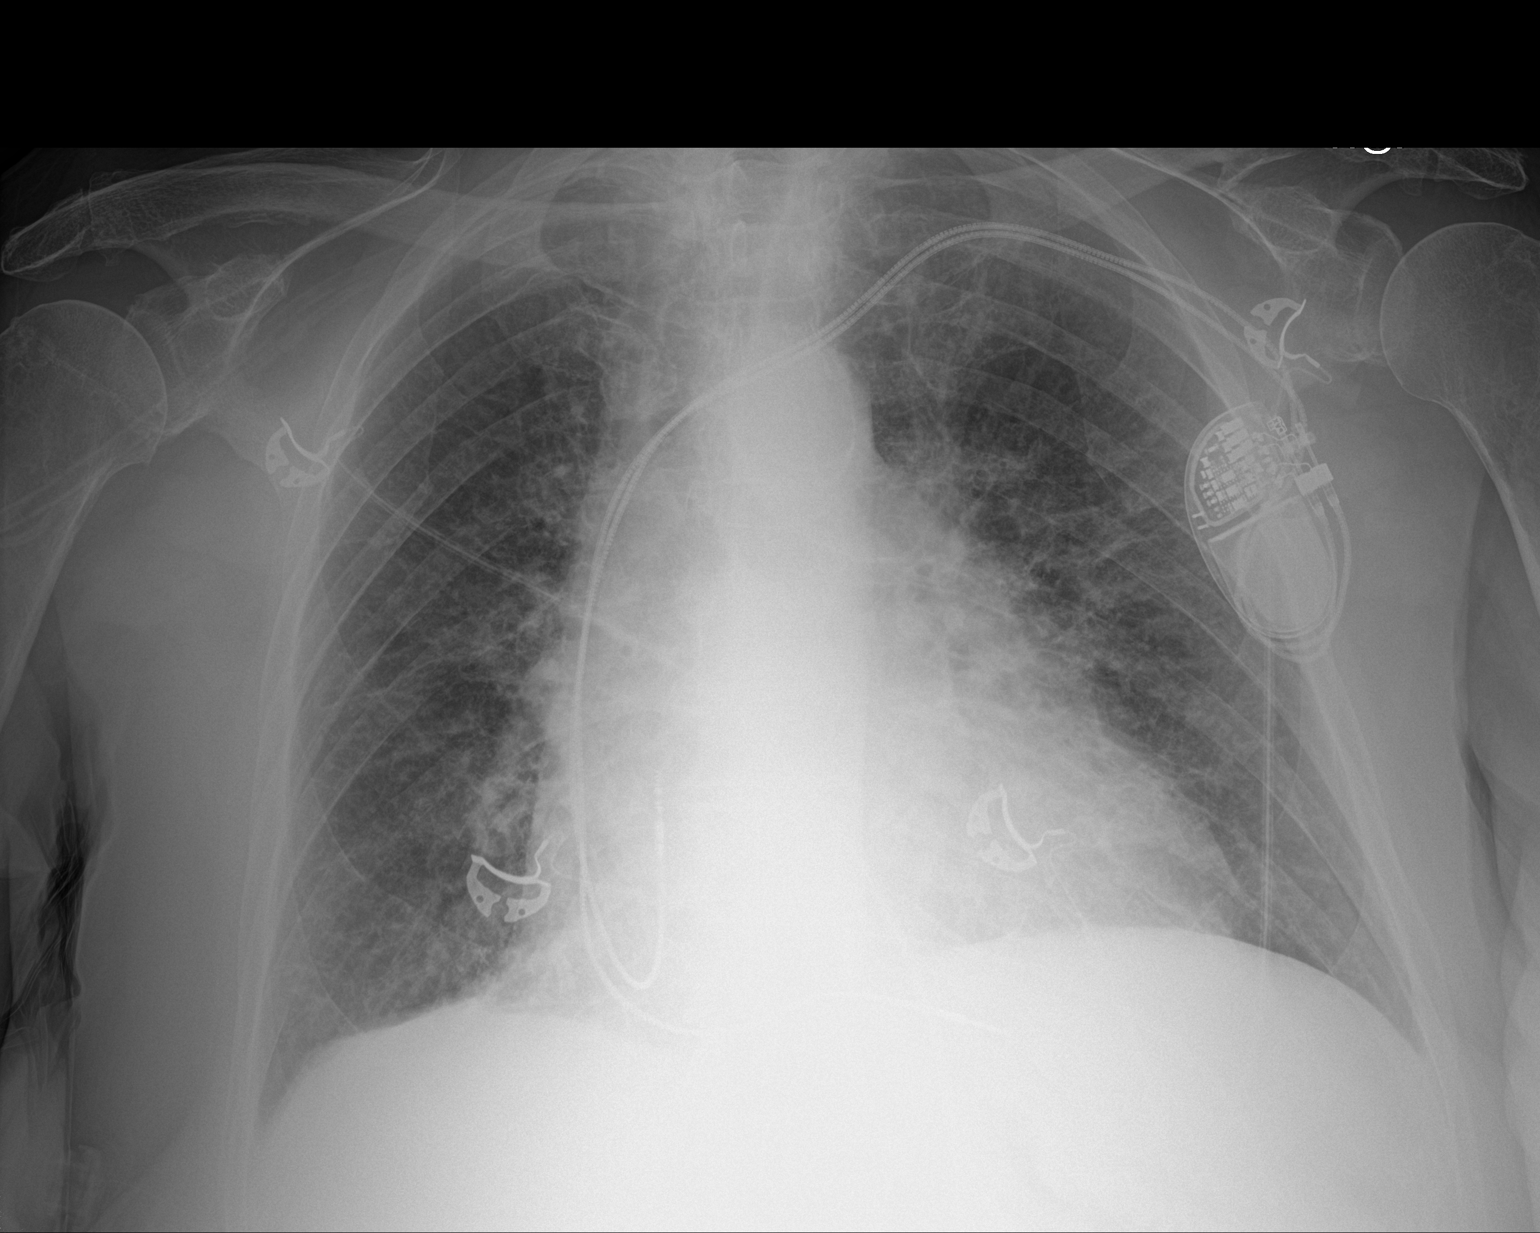

[1 of 1 positions shown; findings below may reference images not displayed]

FINDINGS: Cardiac pacemaker. Cardiac enlargement with mild pulmonary vascular
congestion. Perihilar and basilar interstitial pattern likely
represents interstitial edema. No focal consolidation. No blunting
of costophrenic angles. No pneumothorax. Calcification of the aorta.
Degenerative changes in the spine and shoulders.
IMPRESSION: Cardiac enlargement with mild vascular congestion and interstitial
edema in the lungs. No focal consolidation or effusion. Aortic
atherosclerosis.

## 2018-06-25 ENCOUNTER — Encounter: Payer: Self-pay | Admitting: Thoracic Surgery (Cardiothoracic Vascular Surgery)

## 2018-08-31 ENCOUNTER — Ambulatory Visit (INDEPENDENT_AMBULATORY_CARE_PROVIDER_SITE_OTHER): Payer: Medicare Other | Admitting: *Deleted

## 2018-08-31 DIAGNOSIS — I495 Sick sinus syndrome: Secondary | ICD-10-CM

## 2018-08-31 DIAGNOSIS — I5032 Chronic diastolic (congestive) heart failure: Secondary | ICD-10-CM

## 2018-08-31 LAB — CUP PACEART REMOTE DEVICE CHECK
Battery Remaining Longevity: 65 mo
Battery Voltage: 3 V
Brady Statistic AP VP Percent: 0.01 %
Brady Statistic AP VS Percent: 0 %
Brady Statistic AS VP Percent: 96.01 %
Brady Statistic AS VS Percent: 3.98 %
Brady Statistic RA Percent Paced: 0.01 %
Brady Statistic RV Percent Paced: 96.05 %
Date Time Interrogation Session: 20200609095621
Implantable Lead Implant Date: 20171212
Implantable Lead Implant Date: 20171212
Implantable Lead Location: 753859
Implantable Lead Location: 753860
Implantable Lead Model: 5076
Implantable Lead Model: 5076
Implantable Pulse Generator Implant Date: 20171212
Lead Channel Impedance Value: 304 Ohm
Lead Channel Impedance Value: 380 Ohm
Lead Channel Impedance Value: 399 Ohm
Lead Channel Impedance Value: 456 Ohm
Lead Channel Pacing Threshold Amplitude: 0.5 V
Lead Channel Pacing Threshold Pulse Width: 0.4 ms
Lead Channel Sensing Intrinsic Amplitude: 1.5 mV
Lead Channel Sensing Intrinsic Amplitude: 1.5 mV
Lead Channel Sensing Intrinsic Amplitude: 11.875 mV
Lead Channel Sensing Intrinsic Amplitude: 11.875 mV
Lead Channel Setting Pacing Amplitude: 2 V
Lead Channel Setting Pacing Amplitude: 2.5 V
Lead Channel Setting Pacing Pulse Width: 0.4 ms
Lead Channel Setting Sensing Sensitivity: 2.8 mV

## 2018-09-08 NOTE — Progress Notes (Signed)
Remote pacemaker transmission.   

## 2018-09-29 ENCOUNTER — Telehealth: Payer: Self-pay | Admitting: Cardiology

## 2018-09-29 NOTE — Telephone Encounter (Signed)
New message   Pt scheduled for virtual visit via phone call on Monday 07.13.20 with Dr. Curt Bears. Pt does not have a smart phone, her phone number is listed in appt notes.      Virtual Visit Pre-Appointment Phone Call  "(Name), I am calling you today to discuss your upcoming appointment. We are currently trying to limit exposure to the virus that causes COVID-19 by seeing patients at home rather than in the office."  1. "What is the BEST phone number to call the day of the visit?" - include this in appointment notes  2. Do you have or have access to (through a family member/friend) a smartphone with video capability that we can use for your visit?" a. If yes - list this number in appt notes as cell (if different from BEST phone #) and list the appointment type as a VIDEO visit in appointment notes b. If no - list the appointment type as a PHONE visit in appointment notes  3. Confirm consent - "In the setting of the current Covid19 crisis, you are scheduled for a (phone or video) visit with your provider on (date) at (time).  Just as we do with many in-office visits, in order for you to participate in this visit, we must obtain consent.  If you'd like, I can send this to your mychart (if signed up) or email for you to review.  Otherwise, I can obtain your verbal consent now.  All virtual visits are billed to your insurance company just like a normal visit would be.  By agreeing to a virtual visit, we'd like you to understand that the technology does not allow for your provider to perform an examination, and thus may limit your provider's ability to fully assess your condition. If your provider identifies any concerns that need to be evaluated in person, we will make arrangements to do so.  Finally, though the technology is pretty good, we cannot assure that it will always work on either your or our end, and in the setting of a video visit, we may have to convert it to a phone-only visit.  In  either situation, we cannot ensure that we have a secure connection.  Are you willing to proceed?" STAFF: Did the patient verbally acknowledge consent to telehealth visit? Document YES/NO here: YES  4. Advise patient to be prepared - "Two hours prior to your appointment, go ahead and check your blood pressure, pulse, oxygen saturation, and your weight (if you have the equipment to check those) and write them all down. When your visit starts, your provider will ask you for this information. If you have an Apple Watch or Kardia device, please plan to have heart rate information ready on the day of your appointment. Please have a pen and paper handy nearby the day of the visit as well."  5. Give patient instructions for MyChart download to smartphone OR Doximity/Doxy.me as below if video visit (depending on what platform provider is using)  6. Inform patient they will receive a phone call 15 minutes prior to their appointment time (may be from unknown caller ID) so they should be prepared to answer    TELEPHONE CALL NOTE  Theresa Cain has been deemed a candidate for a follow-up tele-health visit to limit community exposure during the Covid-19 pandemic. I spoke with the patient via phone to ensure availability of phone/video source, confirm preferred email & phone number, and discuss instructions and expectations.  I reminded Theresa Cain to be prepared  with any vital sign and/or heart rhythm information that could potentially be obtained via home monitoring, at the time of her visit. I reminded Theresa Cain to expect a phone call prior to her visit.  Theresa Cain 09/29/2018 9:20 AM   INSTRUCTIONS FOR DOWNLOADING THE MYCHART APP TO SMARTPHONE  - The patient must first make sure to have activated MyChart and know their login information - If Apple, go to [employer] and type in MyChart in the search bar and download the app. If Android, ask patient to go to [employer] and type in [place] in the  search bar and download the app. The app is free but as with any other app downloads, their phone may require them to verify saved payment information or Apple/Android password.  - The patient will need to then log into the app with their MyChart username and password, and select [facility] as their healthcare provider to link the account. When it is time for your visit, go to the MyChart app, find appointments, and click Begin Video Visit. Be sure to Select Allow for your device to access the Microphone and Camera for your visit. You will then be connected, and your provider will be with you shortly.  **If they have any issues connecting, or need assistance please contact MyChart service desk [phone])**  **If using a computer, in order to ensure the best quality for their visit they will need to use either of the following Internet Browsers: [employer], or Google Chrome**  IF USING DOXIMITY or DOXY.ME - The patient will receive a link just prior to their visit by text.     FULL LENGTH CONSENT FOR TELE-HEALTH VISIT   I hereby voluntarily request, consent and authorize [facility] and its employed or contracted physicians, physician assistants, nurse practitioners or other licensed health care professionals (the Practitioner), to provide me with telemedicine health care services (the Services") as deemed necessary by the treating Practitioner. I acknowledge and consent to receive the Services by the Practitioner via telemedicine. I understand that the telemedicine visit will involve communicating with the Practitioner through live audiovisual communication technology and the disclosure of certain medical information by electronic transmission. I acknowledge that I have been given the opportunity to request an in-person assessment or other available alternative prior to the telemedicine visit and am voluntarily participating in the telemedicine visit.  I understand that I  have the right to withhold or withdraw my consent to the use of telemedicine in the course of my care at any time, without affecting my right to future care or treatment, and that the Practitioner or I may terminate the telemedicine visit at any time. I understand that I have the right to inspect all information obtained and/or recorded in the course of the telemedicine visit and may receive copies of available information for a reasonable fee.  I understand that some of the potential risks of receiving the Services via telemedicine include:   Delay or interruption in medical evaluation due to technological equipment failure or disruption;  Information transmitted may not be sufficient (e.g. poor resolution of images) to allow for appropriate medical decision making by the Practitioner; and/or   In rare instances, security protocols could fail, causing a breach of personal health information.  Furthermore, I acknowledge that it is my responsibility to provide information about my medical history, conditions and care that is complete and accurate to the best of my ability. I acknowledge that Practitioner's advice, recommendations, and/or  decision may be based on factors not within their control, such as incomplete or inaccurate data provided by me or distortions of diagnostic images or specimens that may result from electronic transmissions. I understand that the practice of medicine is not an exact science and that Practitioner makes no warranties or guarantees regarding treatment outcomes. I acknowledge that I will receive a copy of this consent concurrently upon execution via email to the email address I last provided but may also request a printed copy by calling the office of [facility].    I understand that my insurance will be billed for this visit.   I have read or had this consent read to me.  I understand the contents of this consent, which adequately explains the benefits and risks of the  Services being provided via telemedicine.   I have been provided ample opportunity to ask questions regarding this consent and the Services and have had my questions answered to my satisfaction.  I give my informed consent for the services to be provided through the use of telemedicine in my medical care  By participating in this telemedicine visit I agree to the above.

## 2018-10-04 ENCOUNTER — Telehealth (INDEPENDENT_AMBULATORY_CARE_PROVIDER_SITE_OTHER): Payer: Medicare Other | Admitting: Cardiology

## 2018-10-04 DIAGNOSIS — I4821 Permanent atrial fibrillation: Secondary | ICD-10-CM | POA: Diagnosis not present

## 2018-10-04 NOTE — Progress Notes (Signed)
Electrophysiology TeleHealth Note   Due to national recommendations of social distancing due to COVID 19, an audio/video telehealth visit is felt to be most appropriate for this patient at this time.  See Epic message for the patient's consent to telehealth for [facility].   Date:  10/04/2018   ID:  Theresa Cain, DOB 1927-03-21, MRN [id number]  Location: patient's home  Provider location: [address], [place]  Evaluation Performed: Follow-up visit  PCP:  Kendrick Ranch, MD  Cardiologist:  Sherren Mocha, MD  Electrophysiologist:  Dr Curt Bears  Chief Complaint: Pacemaker  History of Present Illness:    Theresa Cain is a 85 y.o. female who presents via audio/video conferencing for a telehealth visit today.  Since last being seen in our clinic, the patient reports doing very well.  Today, she denies symptoms of palpitations, chest pain, shortness of breath,  lower extremity edema, dizziness, presyncope, or syncope.  The patient is otherwise without complaint today.  The patient denies symptoms of fevers, chills, cough, or new SOB worrisome for COVID 19.  She has a history significant for atrial fibrillation, carotid artery disease, hypertension, CVA, PAD.  She has a Medtronic pacemaker implanted 03/04/2016 for tachybradycardia syndrome.  Her atrial fibrillation has become permanent.  Today, denies symptoms of palpitations, chest pain, shortness of breath, orthopnea, PND, lower extremity edema, claudication, dizziness, presyncope, syncope, bleeding, or neurologic sequela. The patient is tolerating medications without difficulties.    Past Medical History:  Diagnosis Date  . Acute on chronic diastolic CHF (congestive heart failure) ([facility]) 02/22/2016  . Anemia   . Bradycardia   . Carotid artery occlusion   . Chronic diastolic CHF (congestive heart failure) ([place])   . Chronic kidney disease    elevated creatinne  . Coronary artery disease involving native  coronary artery of native heart   . GERD (gastroesophageal reflux disease)   . HLD (hyperlipidemia)   . Hypertension   . Hypothyroidism   . Obesity   . Osteoarthritis   . Permanent atrial fibrillation   . Presence of permanent cardiac pacemaker   . PVD (peripheral vascular disease) ([place])   . 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 Severe aortic stenosis   . Stomach cancer ([place]) ~ 2013  . Vertigo     Past Surgical History:  Procedure Laterality Date  . CARDIOVASCULAR STRESS TEST    . CARDIOVERSION N/A 08/08/2016   Procedure: CARDIOVERSION;  Surgeon: Sueanne Margarita, MD;  [facility] ENDOSCOPY;  Service: Cardiovascular;  Laterality: N/A;  . CAROTID ENDARTERECTOMY Right ~ 2005  . CATARACT EXTRACTION W/ INTRAOCULAR LENS IMPLANT Right   . CHOLECYSTECTOMY  ~ 2013   "part of her [facility] OR"  . EP IMPLANTABLE DEVICE N/A 03/04/2016   Procedure: Pacemaker Implant;  Surgeon: Avon Molock Meredith Leeds, MD;  Location: [facility] CV LAB;  Service: Cardiovascular;  Laterality: N/A;  . EP IMPLANTABLE DEVICE N/A 03/05/2016   Procedure: Lead Revision/Repair;  Surgeon: Deboraha Sprang, MD;  Location: [facility] CV LAB;  Service: Cardiovascular;  Laterality: N/A;  . INSERT / REPLACE / REMOVE PACEMAKER    . LEFT HEART CATH AND CORONARY ANGIOGRAPHY N/A 03/27/2017   Procedure: LEFT HEART CATH AND CORONARY ANGIOGRAPHY;  Surgeon: Minna Merritts, MD;  Location: [facility] CV LAB;  Service: Cardiovascular;  Laterality: N/A;  . TEE WITHOUT CARDIOVERSION N/A 05/12/2017  Procedure: TRANSESOPHAGEAL ECHOCARDIOGRAM (TEE);  Surgeon: Sherren Mocha, MD;  Location: [facility];  Service: Open Heart Surgery;  Laterality: N/A;  . TRANSCATHETER AORTIC VALVE REPLACEMENT, TRANSFEMORAL N/A 05/12/2017   Procedure: TRANSCATHETER AORTIC VALVE REPLACEMENT, TRANSFEMORAL using a 27mm Edwards Sapien 3 Aortic  Valve;  Surgeon: Sherren Mocha, MD;  Location: [facility];  Service: Open Heart Surgery;  Laterality: N/A;  . US ECHOCARDIOGRAPHY    . VAGINAL HYSTERECTOMY    . WHIPPLE PROCEDURE  ~ 2013    Current Outpatient Medications  Medication Sig Dispense Refill  . acetaminophen (TYLENOL) 500 MG tablet Take 1,000 mg by mouth every 6 (six) hours as needed for moderate pain.    Marland Kitchen apixaban (ELIQUIS) 5 MG TABS tablet Take 1 tablet (5 mg total) by mouth 2 (two) times daily. (Patient taking differently: Take 2.5 mg by mouth 2 (two) times daily. ) 60 tablet 8  . clindamycin (CLEOCIN) 300 MG capsule Take 30-60 minutes prior to dental visit. 2 capsule 6  . diltiazem (CARDIZEM CD) 180 MG 24 hr capsule Take 1 capsule (180 mg total) by mouth daily. 90 capsule 3  . escitalopram (LEXAPRO) 10 MG tablet Take 10 mg by mouth at bedtime.     . ferrous sulfate 325 (65 FE) MG tablet Take 325 mg by mouth daily with breakfast.    . gabapentin (NEURONTIN) 600 MG tablet Take 600 mg by mouth 2 (two) times daily.     Marland Kitchen omeprazole (PRILOSEC) 20 MG capsule Take 20 mg by mouth daily.    . potassium chloride (K-DUR) 10 MEQ tablet Take 1 tablet (10 mEq total) by mouth 2 (two) times daily. 60 tablet 0  . pravastatin (PRAVACHOL) 40 MG tablet Take 40 mg by mouth every evening.     . torsemide (DEMADEX) 20 MG tablet Take 1 tablet (20 mg total) by mouth daily. Take one additional tablet daily as needed for swelling. 45 tablet 11  . Vitamin D, Ergocalciferol, (DRISDOL) 50000 units CAPS capsule Take 50,000 Units by mouth 2 (two) times a week. Take on Sunday and Thursday    . levothyroxine (SYNTHROID) 50 MCG tablet Take 1 tablet (50 mcg total) by mouth daily. 30 tablet 0   No current facility-administered medications for this visit.     Allergies:   Penicillins and Sulfa antibiotics   Social History:  The patient  reports that she has never smoked. She has never used smokeless tobacco. She reports that she does not drink alcohol or use  drugs.   Family History:  The patient's  family history includes Heart disease in her sister.   ROS:  Please see the history of present illness.   All other systems are personally reviewed and negative.    Exam:    Vital Signs:  BP 122/72  no acute distress, no shortness of breath.  Labs/Other Tests and Data Reviewed:    Recent Labs: No results found for requested labs within last 8760 hours.   Wt Readings from Last 3 Encounters:  09/12/17 208 lb (94.3 kg)  08/25/17 208 lb (94.3 kg)  06/19/17 206 lb 6.4 oz (93.6 kg)     Other studies personally reviewed: Additional studies/ records that were reviewed today include: 09/13/2017 ECG personally reviewed Review of the above records today demonstrates: Atrial fibrillation, ventricular paced  Last device remote is reviewed from [facility] PDF dated 08/31/2018 which reveals normal device function, atrial fibrillation noted   ASSESSMENT & PLAN:    1.  Permanent atrial fibrillation: Currently on Eliquis  and diltiazem.  This patients CHA2DS2-VASc Score and unadjusted Ischemic Stroke Rate (% per year) is equal to 7.2 % stroke rate/year from a score of 5  Above score calculated as 1 point each if present [CHF, HTN, DM, Vascular=MI/PAD/Aortic Plaque, Age if 65-74, or Female] Above score calculated as 2 points each if present [Age > 75, or Stroke/TIA/TE]  2.  Tachybradycardia syndrome: Status post Medtronic dual-chamber pacemaker implanted 2017.  Functioning appropriately.  No changes.  3.  Aortic stenosis: S/p TAVR. Plan per primary cardiology   COVID 19 screen The patient denies symptoms of COVID 19 at this time.  The importance of social distancing was discussed today.  Follow-up:  1 year  Current medicines are reviewed at length with the patient today.   The patient does not have concerns regarding her medicines.  The following changes were made today:  none  Labs/ tests ordered today include:  No orders of the defined types were  placed in this encounter.    Patient Risk:  after full review of this patients clinical status, I feel that they are at moderate risk at this time.  Today, I have spent 8 minutes with the patient with telehealth technology discussing AF, PPM .    Signed, Martyna Thorns Meredith Leeds, MD  10/04/2018 3:04 PM     [facility] [address] [place] [address] [phone] (office) [phone] (fax)

## 2018-12-01 ENCOUNTER — Ambulatory Visit (INDEPENDENT_AMBULATORY_CARE_PROVIDER_SITE_OTHER): Payer: Medicare Other | Admitting: *Deleted

## 2018-12-01 DIAGNOSIS — I5032 Chronic diastolic (congestive) heart failure: Secondary | ICD-10-CM | POA: Diagnosis not present

## 2018-12-01 DIAGNOSIS — I4821 Permanent atrial fibrillation: Secondary | ICD-10-CM

## 2018-12-02 ENCOUNTER — Telehealth: Payer: Self-pay | Admitting: Cardiology

## 2018-12-02 NOTE — Telephone Encounter (Signed)
Per pt call her device is not transmitting like it's supposed to and would like some help with this please give her a call back.   1. Has your device fired? No    2. Is you device beeping? No   3. Are you experiencing draining or swelling at device site? no  4. Are you calling to see if we received your device transmission? Was told to do today  5. Have you passed out? no    Please route to [facility]

## 2018-12-03 NOTE — Telephone Encounter (Signed)
Spoke with patient. Assisted with troubleshooting monitor. Transmission slowly progressing. Advised I'll call back to confirm if it was received.  Called patient back--advised transmission was received. Next transmission on 03/02/2019. Pt denies additional questions or concerns at this time.

## 2018-12-05 LAB — CUP PACEART REMOTE DEVICE CHECK
Battery Remaining Longevity: 62 mo
Battery Voltage: 3 V
Brady Statistic AP VP Percent: 0.3 %
Brady Statistic AP VS Percent: 0.01 %
Brady Statistic AS VP Percent: 88.87 %
Brady Statistic AS VS Percent: 10.83 %
Brady Statistic RA Percent Paced: 0.21 %
Brady Statistic RV Percent Paced: 89.19 %
Date Time Interrogation Session: 20200911194916
Implantable Lead Implant Date: 20171212
Implantable Lead Implant Date: 20171212
Implantable Lead Location: 753859
Implantable Lead Location: 753860
Implantable Lead Model: 5076
Implantable Lead Model: 5076
Implantable Pulse Generator Implant Date: 20171212
Lead Channel Impedance Value: 304 Ohm
Lead Channel Impedance Value: 380 Ohm
Lead Channel Impedance Value: 399 Ohm
Lead Channel Impedance Value: 475 Ohm
Lead Channel Pacing Threshold Amplitude: 0.5 V
Lead Channel Pacing Threshold Pulse Width: 0.4 ms
Lead Channel Sensing Intrinsic Amplitude: 0.375 mV
Lead Channel Sensing Intrinsic Amplitude: 0.375 mV
Lead Channel Sensing Intrinsic Amplitude: 8.75 mV
Lead Channel Sensing Intrinsic Amplitude: 8.75 mV
Lead Channel Setting Pacing Amplitude: 2 V
Lead Channel Setting Pacing Amplitude: 2.5 V
Lead Channel Setting Pacing Pulse Width: 0.4 ms
Lead Channel Setting Sensing Sensitivity: 2.8 mV

## 2018-12-15 NOTE — Progress Notes (Signed)
Remote pacemaker transmission.   

## 2019-03-02 ENCOUNTER — Ambulatory Visit (INDEPENDENT_AMBULATORY_CARE_PROVIDER_SITE_OTHER): Payer: Medicare Other | Admitting: *Deleted

## 2019-03-02 DIAGNOSIS — Z950 Presence of cardiac pacemaker: Secondary | ICD-10-CM | POA: Diagnosis not present

## 2019-03-02 LAB — CUP PACEART REMOTE DEVICE CHECK
Battery Remaining Longevity: 55 mo
Battery Voltage: 2.99 V
Brady Statistic AP VP Percent: 0.03 %
Brady Statistic AP VS Percent: 0 %
Brady Statistic AS VP Percent: 83.77 %
Brady Statistic AS VS Percent: 16.2 %
Brady Statistic RA Percent Paced: 0.02 %
Brady Statistic RV Percent Paced: 83.78 %
Date Time Interrogation Session: 20201209082844
Implantable Lead Implant Date: 20171212
Implantable Lead Implant Date: 20171212
Implantable Lead Location: 753859
Implantable Lead Location: 753860
Implantable Lead Model: 5076
Implantable Lead Model: 5076
Implantable Pulse Generator Implant Date: 20171212
Lead Channel Impedance Value: 304 Ohm
Lead Channel Impedance Value: 361 Ohm
Lead Channel Impedance Value: 380 Ohm
Lead Channel Impedance Value: 437 Ohm
Lead Channel Pacing Threshold Amplitude: 0.375 V
Lead Channel Pacing Threshold Pulse Width: 0.4 ms
Lead Channel Sensing Intrinsic Amplitude: 0.5 mV
Lead Channel Sensing Intrinsic Amplitude: 0.5 mV
Lead Channel Sensing Intrinsic Amplitude: 9.5 mV
Lead Channel Sensing Intrinsic Amplitude: 9.5 mV
Lead Channel Setting Pacing Amplitude: 2 V
Lead Channel Setting Pacing Amplitude: 2.5 V
Lead Channel Setting Pacing Pulse Width: 0.4 ms
Lead Channel Setting Sensing Sensitivity: 2.8 mV

## 2019-04-09 NOTE — Progress Notes (Signed)
PPM remote 

## 2019-05-10 ENCOUNTER — Other Ambulatory Visit: Payer: Self-pay

## 2019-05-10 ENCOUNTER — Inpatient Hospital Stay
Admission: EM | Admit: 2019-05-10 | Discharge: 2019-05-15 | DRG: 375 | Disposition: A | Discharge status: Home / Self Care | Payer: Medicare Other | Attending: Internal Medicine | Admitting: Internal Medicine

## 2019-05-10 ENCOUNTER — Encounter: Payer: Self-pay | Admitting: Emergency Medicine

## 2019-05-10 DIAGNOSIS — I2510 Atherosclerotic heart disease of native coronary artery without angina pectoris: Secondary | ICD-10-CM | POA: Diagnosis present

## 2019-05-10 DIAGNOSIS — Z79890 Hormone replacement therapy (postmenopausal): Secondary | ICD-10-CM

## 2019-05-10 DIAGNOSIS — E039 Hypothyroidism, unspecified: Secondary | ICD-10-CM | POA: Diagnosis present

## 2019-05-10 DIAGNOSIS — M6281 Muscle weakness (generalized): Secondary | ICD-10-CM | POA: Diagnosis not present

## 2019-05-10 DIAGNOSIS — D500 Iron deficiency anemia secondary to blood loss (chronic): Secondary | ICD-10-CM | POA: Diagnosis present

## 2019-05-10 DIAGNOSIS — N183 Chronic kidney disease, stage 3 unspecified: Secondary | ICD-10-CM | POA: Diagnosis present

## 2019-05-10 DIAGNOSIS — Y832 Surgical operation with anastomosis, bypass or graft as the cause of abnormal reaction of the patient, or of later complication, without mention of misadventure at the time of the procedure: Secondary | ICD-10-CM | POA: Diagnosis present

## 2019-05-10 DIAGNOSIS — Z90710 Acquired absence of both cervix and uterus: Secondary | ICD-10-CM

## 2019-05-10 DIAGNOSIS — R29818 Other symptoms and signs involving the nervous system: Secondary | ICD-10-CM | POA: Diagnosis not present

## 2019-05-10 DIAGNOSIS — K219 Gastro-esophageal reflux disease without esophagitis: Secondary | ICD-10-CM | POA: Diagnosis present

## 2019-05-10 DIAGNOSIS — I739 Peripheral vascular disease, unspecified: Secondary | ICD-10-CM | POA: Diagnosis present

## 2019-05-10 DIAGNOSIS — E785 Hyperlipidemia, unspecified: Secondary | ICD-10-CM | POA: Diagnosis present

## 2019-05-10 DIAGNOSIS — Z86018 Personal history of other benign neoplasm: Secondary | ICD-10-CM

## 2019-05-10 DIAGNOSIS — Z8249 Family history of ischemic heart disease and other diseases of the circulatory system: Secondary | ICD-10-CM

## 2019-05-10 DIAGNOSIS — K922 Gastrointestinal hemorrhage, unspecified: Secondary | ICD-10-CM

## 2019-05-10 DIAGNOSIS — Z9049 Acquired absence of other specified parts of digestive tract: Secondary | ICD-10-CM

## 2019-05-10 DIAGNOSIS — I130 Hypertensive heart and chronic kidney disease with heart failure and stage 1 through stage 4 chronic kidney disease, or unspecified chronic kidney disease: Secondary | ICD-10-CM | POA: Diagnosis present

## 2019-05-10 DIAGNOSIS — I10 Essential (primary) hypertension: Secondary | ICD-10-CM | POA: Diagnosis not present

## 2019-05-10 DIAGNOSIS — I5032 Chronic diastolic (congestive) heart failure: Secondary | ICD-10-CM | POA: Diagnosis present

## 2019-05-10 DIAGNOSIS — Z953 Presence of xenogenic heart valve: Secondary | ICD-10-CM

## 2019-05-10 DIAGNOSIS — Z20822 Contact with and (suspected) exposure to covid-19: Secondary | ICD-10-CM | POA: Diagnosis present

## 2019-05-10 DIAGNOSIS — I4821 Permanent atrial fibrillation: Secondary | ICD-10-CM | POA: Diagnosis present

## 2019-05-10 DIAGNOSIS — Z880 Allergy status to penicillin: Secondary | ICD-10-CM | POA: Diagnosis not present

## 2019-05-10 DIAGNOSIS — Z7901 Long term (current) use of anticoagulants: Secondary | ICD-10-CM | POA: Diagnosis not present

## 2019-05-10 DIAGNOSIS — Z882 Allergy status to sulfonamides status: Secondary | ICD-10-CM | POA: Diagnosis not present

## 2019-05-10 DIAGNOSIS — F329 Major depressive disorder, single episode, unspecified: Secondary | ICD-10-CM | POA: Diagnosis present

## 2019-05-10 DIAGNOSIS — K921 Melena: Secondary | ICD-10-CM | POA: Diagnosis not present

## 2019-05-10 DIAGNOSIS — Z950 Presence of cardiac pacemaker: Secondary | ICD-10-CM

## 2019-05-10 DIAGNOSIS — C160 Malignant neoplasm of cardia: Secondary | ICD-10-CM | POA: Diagnosis present

## 2019-05-10 DIAGNOSIS — K9589 Other complications of other bariatric procedure: Secondary | ICD-10-CM | POA: Diagnosis present

## 2019-05-10 DIAGNOSIS — Z79899 Other long term (current) drug therapy: Secondary | ICD-10-CM

## 2019-05-10 DIAGNOSIS — I6529 Occlusion and stenosis of unspecified carotid artery: Secondary | ICD-10-CM | POA: Diagnosis present

## 2019-05-10 DIAGNOSIS — I6389 Other cerebral infarction: Secondary | ICD-10-CM | POA: Diagnosis not present

## 2019-05-10 DIAGNOSIS — Z888 Allergy status to other drugs, medicaments and biological substances status: Secondary | ICD-10-CM

## 2019-05-10 DIAGNOSIS — Z9884 Bariatric surgery status: Secondary | ICD-10-CM

## 2019-05-10 DIAGNOSIS — Z85028 Personal history of other malignant neoplasm of stomach: Secondary | ICD-10-CM | POA: Diagnosis not present

## 2019-05-10 DIAGNOSIS — N1831 Chronic kidney disease, stage 3a: Secondary | ICD-10-CM | POA: Diagnosis present

## 2019-05-10 DIAGNOSIS — N1832 Chronic kidney disease, stage 3b: Secondary | ICD-10-CM | POA: Diagnosis not present

## 2019-05-10 LAB — COMPREHENSIVE METABOLIC PANEL
ALT: 10 U/L (ref 0–44)
AST: 17 U/L (ref 15–41)
Albumin: 3.6 g/dL (ref 3.5–5.0)
Alkaline Phosphatase: 53 U/L (ref 38–126)
Anion gap: 9 (ref 5–15)
BUN: 22 mg/dL (ref 8–23)
CO2: 23 mmol/L (ref 22–32)
Calcium: 8.8 mg/dL — ABNORMAL LOW (ref 8.9–10.3)
Chloride: 108 mmol/L (ref 98–111)
Creatinine, Ser: 1.34 mg/dL — ABNORMAL HIGH (ref 0.44–1.00)
GFR calc Af Amer: 40 mL/min — ABNORMAL LOW (ref >60)
GFR calc non Af Amer: 35 mL/min — ABNORMAL LOW (ref >60)
Glucose, Bld: 148 mg/dL — ABNORMAL HIGH (ref 70–99)
Potassium: 4.1 mmol/L (ref 3.5–5.1)
Sodium: 140 mmol/L (ref 135–145)
Total Bilirubin: 0.5 mg/dL (ref 0.3–1.2)
Total Protein: 6.4 g/dL — ABNORMAL LOW (ref 6.5–8.1)

## 2019-05-10 LAB — PROTIME-INR
INR: 1.4 — ABNORMAL HIGH (ref 0.8–1.2)
Prothrombin Time: 16.7 seconds — ABNORMAL HIGH (ref 11.4–15.2)

## 2019-05-10 LAB — SARS CORONAVIRUS 2 (TAT 6-24 HRS): SARS Coronavirus 2: NEGATIVE

## 2019-05-10 LAB — CBC
HCT: 21.6 % — ABNORMAL LOW (ref 36.0–46.0)
HCT: 19.9 % — ABNORMAL LOW (ref 36.0–46.0)
Hemoglobin: 6.6 g/dL — ABNORMAL LOW (ref 12.0–15.0)
Hemoglobin: 6.2 g/dL — ABNORMAL LOW (ref 12.0–15.0)
MCH: 28.1 pg (ref 26.0–34.0)
MCH: 28.7 pg (ref 26.0–34.0)
MCHC: 30.6 g/dL (ref 30.0–36.0)
MCHC: 31.2 g/dL (ref 30.0–36.0)
MCV: 91.9 fL (ref 80.0–100.0)
MCV: 92.1 fL (ref 80.0–100.0)
Platelets: 172 10*3/uL (ref 150–400)
Platelets: 130 10*3/uL — ABNORMAL LOW (ref 150–400)
RBC: 2.35 MIL/uL — ABNORMAL LOW (ref 3.87–5.11)
RBC: 2.16 MIL/uL — ABNORMAL LOW (ref 3.87–5.11)
RDW: 16.2 % — ABNORMAL HIGH (ref 11.5–15.5)
RDW: 16.3 % — ABNORMAL HIGH (ref 11.5–15.5)
WBC: 7.2 10*3/uL (ref 4.0–10.5)
WBC: 5.3 10*3/uL (ref 4.0–10.5)
nRBC: 0 % (ref 0.0–0.2)
nRBC: 0 % (ref 0.0–0.2)

## 2019-05-10 LAB — PREPARE RBC (CROSSMATCH)

## 2019-05-10 LAB — BRAIN NATRIURETIC PEPTIDE: B Natriuretic Peptide: 309 pg/mL — ABNORMAL HIGH (ref 0.0–100.0)

## 2019-05-10 LAB — ABO/RH: ABO/RH(D): B NEG

## 2019-05-10 MED ORDER — HYDRALAZINE HCL 25 MG PO TABS: 25.0000 mg | ORAL_TABLET | Freq: Three times a day (TID) | ORAL | Status: DC | PRN

## 2019-05-10 MED ORDER — ACETAMINOPHEN 325 MG PO TABS: 650.0000 mg | ORAL_TABLET | Freq: Four times a day (QID) | ORAL | Status: DC | PRN

## 2019-05-10 MED ORDER — ESCITALOPRAM OXALATE 10 MG PO TABS
10.0000 mg | ORAL_TABLET | Freq: Every day | ORAL | Status: DC
  Administered 2019-05-11 – 2019-05-14 (×4): 10 mg via ORAL
  Filled 2019-05-10 (×5): qty 1

## 2019-05-10 MED ORDER — PANTOPRAZOLE SODIUM 40 MG IV SOLR: 40.0000 mg | Freq: Once | INTRAVENOUS | Status: DC

## 2019-05-10 MED ORDER — ONDANSETRON HCL 4 MG/2ML IJ SOLN: 4.0000 mg | Freq: Three times a day (TID) | INTRAMUSCULAR | Status: DC | PRN

## 2019-05-10 MED ORDER — LEVOTHYROXINE SODIUM 50 MCG PO TABS
50.0000 ug | ORAL_TABLET | Freq: Every day | ORAL | Status: DC
  Administered 2019-05-12 – 2019-05-15 (×4): 50 ug via ORAL
  Filled 2019-05-10 (×4): qty 1

## 2019-05-10 MED ORDER — SODIUM CHLORIDE 0.9 % IV SOLN
10.0000 mL/h | Freq: Once | INTRAVENOUS | Status: AC
  Administered 2019-05-10: 10 mL/h via INTRAVENOUS

## 2019-05-10 MED ORDER — PANTOPRAZOLE SODIUM 40 MG IV SOLR
40.0000 mg | Freq: Two times a day (BID) | INTRAVENOUS | Status: DC
  Administered 2019-05-14 – 2019-05-15 (×3): 40 mg via INTRAVENOUS
  Filled 2019-05-10 (×2): qty 40

## 2019-05-10 MED ORDER — PRAVASTATIN SODIUM 40 MG PO TABS
40.0000 mg | ORAL_TABLET | Freq: Every day | ORAL | Status: DC
  Administered 2019-05-11 – 2019-05-14 (×4): 40 mg via ORAL
  Filled 2019-05-10 (×4): qty 1

## 2019-05-10 MED ORDER — SODIUM CHLORIDE 0.9% FLUSH: 10.0000 mL | INTRAVENOUS | Status: DC | PRN

## 2019-05-10 MED ORDER — FERROUS SULFATE 325 (65 FE) MG PO TABS
325.0000 mg | ORAL_TABLET | Freq: Every day | ORAL | Status: DC
  Administered 2019-05-11 – 2019-05-15 (×5): 325 mg via ORAL
  Filled 2019-05-10 (×5): qty 1

## 2019-05-10 MED ORDER — TORSEMIDE 20 MG PO TABS
20.0000 mg | ORAL_TABLET | Freq: Every day | ORAL | Status: DC
  Administered 2019-05-11 – 2019-05-15 (×5): 20 mg via ORAL
  Filled 2019-05-10 (×5): qty 1

## 2019-05-10 MED ORDER — DILTIAZEM HCL ER COATED BEADS 180 MG PO CP24
180.0000 mg | ORAL_CAPSULE | Freq: Every day | ORAL | Status: DC
  Administered 2019-05-11 – 2019-05-15 (×5): 180 mg via ORAL
  Filled 2019-05-10 (×5): qty 1

## 2019-05-10 MED ORDER — PANTOPRAZOLE SODIUM 40 MG IV SOLR
40.0000 mg | Freq: Once | INTRAVENOUS | Status: AC
  Administered 2019-05-10: 40 mg via INTRAVENOUS
  Filled 2019-05-10: qty 40

## 2019-05-10 MED ORDER — SODIUM CHLORIDE 0.9 % IV SOLN
8.0000 mg/h | INTRAVENOUS | Status: AC
  Administered 2019-05-10 – 2019-05-13 (×6): 8 mg/h via INTRAVENOUS
  Filled 2019-05-10 (×5): qty 80
  Filled 2019-05-10: qty 40
  Filled 2019-05-10: qty 80

## 2019-05-10 NOTE — H&P (Signed)
History and Physical    Theresa Cain [id number] DOB: [birth year] DOA: 05/10/2019  Referring MD/NP/PA:   PCP: Kendrick Ranch, MD   Patient coming from:  The patient is coming from home.  At baseline, pt is independent for most of ADL.        Chief Complaint: Generalized weakness, black stool  HPI: Theresa Cain is a 86 y.o. female with medical history significant of hypertension, hyperlipidemia, GERD, hypothyroidism, depression, stomach cancer 2013, s/p of TAVR, PVD, atrial fibrillation on Eliquis, CKD stage III, dCHF, anemia, who presents with generalized weakness and black stool.  Patient states that she has been having generalized weakness and fatigue for more than 2 weeks, which has been progressively worsening.  She feels like she is going to pass out, but did not.  She also noticed black stool intermittently in the past several days.  No nausea vomiting, diarrhea or abdominal pain.  Patient does not have chest pain, shortness breath, cough, symptoms of UTI or unilateral weakness.  Patient last dose of Eliquis was this morning at about 8 AM.  ED Course: pt was found to have hemoglobin dropped from 13.1 on 09/12/2017 to 6.6, 6.2, INR 1.4, lactic acid 1.4, renal function close to baseline, temperature normal, blood pressure 132/52, heart rate 64, RR 16, oxygen saturation 99% on room air.  Patient is admitted to telemetry bed as inpatient.  GI, Dr. Marylene Land was consulted.  Review of Systems:   General: no fevers, chills, no body weight gain, has fatigue HEENT: no blurry vision, hearing changes or sore throat Respiratory: no dyspnea, coughing, wheezing CV: no chest pain, no palpitations GI: no nausea, vomiting, abdominal pain, diarrhea, constipation. Has black stool GU: no dysuria, burning on urination, increased urinary frequency, hematuria  Ext: no leg edema Neuro: no unilateral weakness, numbness, or tingling, no vision change or hearing loss Skin: no rash, no skin tear. MSK:  No muscle spasm, no deformity, no limitation of range of movement in spin Heme: No easy bruising.  Travel history: No recent long distant travel.  Allergy:  Allergies  Allergen Reactions  . Penicillins Hives and Other (See Comments)    PATIENT HAS HAD A PCN REACTION WITH IMMEDIATE RASH, FACIAL/TONGUE/THROAT SWELLING, SOB, OR LIGHTHEADEDNESS WITH HYPOTENSION:  #  #  #  YES  #  #  #   Has patient had a PCN reaction causing severe rash involving mucus membranes or skin necrosis: No Has patient had a PCN reaction that required hospitalization: No Has patient had a PCN reaction occurring within the last 10 years: No If all of the above answers are "NO", then may proceed with Cephalosporin use.   . Sulfa Antibiotics Hives  . Cymbalta [Duloxetine Hcl]     Sweat, feels like having a heart attack     Past Medical History:  Diagnosis Date  . Acute on chronic diastolic CHF (congestive heart failure) ([place]) 02/22/2016  . Anemia   . Bradycardia   . Carotid artery occlusion   . Chronic diastolic CHF (congestive heart failure) ([place])   . Chronic kidney disease    elevated creatinne  . Coronary artery disease involving native coronary artery of native heart   . GERD (gastroesophageal reflux disease)   . HLD (hyperlipidemia)   . Hypertension   . Hypothyroidism   . Obesity   . Osteoarthritis   . Permanent atrial fibrillation ([place])   . Presence of permanent cardiac pacemaker   . PVD (peripheral vascular disease) ([place])   . 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 Severe aortic stenosis   . Stomach cancer ([place]) ~ 2013  . Vertigo     Past Surgical History:  Procedure Laterality Date  . CARDIOVASCULAR STRESS TEST    . CARDIOVERSION N/A 08/08/2016   Procedure: CARDIOVERSION;  Surgeon: Sueanne Margarita, MD;  Location: [facility] ENDOSCOPY;  Service: Cardiovascular;  Laterality:  N/A;  . CAROTID ENDARTERECTOMY Right ~ 2005  . CATARACT EXTRACTION W/ INTRAOCULAR LENS IMPLANT Right   . CHOLECYSTECTOMY  ~ 2013   "part of her [facility] OR"  . EP IMPLANTABLE DEVICE N/A 03/04/2016   Procedure: Pacemaker Implant;  Surgeon: Will Meredith Leeds, MD;  Location: [facility] CV LAB;  Service: Cardiovascular;  Laterality: N/A;  . EP IMPLANTABLE DEVICE N/A 03/05/2016   Procedure: Lead Revision/Repair;  Surgeon: Deboraha Sprang, MD;  Location: [facility] CV LAB;  Service: Cardiovascular;  Laterality: N/A;  . INSERT / REPLACE / REMOVE PACEMAKER    . LEFT HEART CATH AND CORONARY ANGIOGRAPHY N/A 03/27/2017   Procedure: LEFT HEART CATH AND CORONARY ANGIOGRAPHY;  Surgeon: Minna Merritts, MD;  Location: [facility] CV LAB;  Service: Cardiovascular;  Laterality: N/A;  . TEE WITHOUT CARDIOVERSION N/A 05/12/2017   Procedure: TRANSESOPHAGEAL ECHOCARDIOGRAM (TEE);  Surgeon: Sherren Mocha, MD;  Location: [facility];  Service: Open Heart Surgery;  Laterality: N/A;  . TRANSCATHETER AORTIC VALVE REPLACEMENT, TRANSFEMORAL N/A 05/12/2017   Procedure: TRANSCATHETER AORTIC VALVE REPLACEMENT, TRANSFEMORAL using a 46mm Edwards Sapien 3 Aortic Valve;  Surgeon: Sherren Mocha, MD;  Location: [facility];  Service: Open Heart Surgery;  Laterality: N/A;  . US ECHOCARDIOGRAPHY    . VAGINAL HYSTERECTOMY    . WHIPPLE PROCEDURE  ~ 2013    Social History:  reports that she has never smoked. She has never used smokeless tobacco. She reports that she does not drink alcohol or use drugs.  Family History:  Family History  Problem Relation Age of Onset  . Heart disease Sister      Prior to Admission medications   Medication Sig Start Date End Date Taking? Authorizing Provider  acetaminophen (TYLENOL) 500 MG tablet Take 1,000 mg by mouth every 6 (six) hours as needed for moderate pain.   Yes [provider]  apixaban (ELIQUIS) 5 MG TABS tablet Take 1 tablet (5 mg total) by mouth 2 (two) times daily. Patient  taking differently: Take 2.5 mg by mouth 2 (two) times daily.  01/22/18  Yes Minna Merritts, MD  diltiazem (CARDIZEM CD) 180 MG 24 hr capsule Take 1 capsule (180 mg total) by mouth daily. 03/27/17  Yes Vaughan Basta, MD  escitalopram (LEXAPRO) 10 MG tablet Take 10 mg by mouth at bedtime.    Yes [provider]  ferrous sulfate 325 (65 FE) MG tablet Take 325 mg by mouth daily with breakfast.   Yes [provider]  levothyroxine (SYNTHROID) 50 MCG tablet Take 1 tablet (50 mcg total) by mouth daily. 03/27/17 05/10/19 Yes Vaughan Basta, MD  omeprazole (PRILOSEC) 20 MG capsule Take 20 mg by mouth daily.   Yes [provider]  potassium chloride (K-DUR) 10 MEQ tablet Take 1 tablet (10 mEq total) by mouth 2 (two) times daily. 03/27/17  Yes Vaughan Basta, MD  pravastatin (PRAVACHOL) 40 MG tablet Take 40 mg by mouth every evening.    Yes [provider]  torsemide (DEMADEX) 20 MG tablet Take 1  tablet (20 mg total) by mouth daily. Take one additional tablet daily as needed for swelling. 04/01/17  Yes Eileen Stanford, PA-C  Vitamin D, Ergocalciferol, (DRISDOL) 50000 units CAPS capsule Take 50,000 Units by mouth 2 (two) times a week. Take on Sunday and Thursday   Yes [provider]    Physical Exam: Vitals:   05/10/19 1144 05/10/19 1147 05/10/19 1500  BP:  (!) 132/52 (!) 146/61  Pulse:  64 64  Resp:  16   Temp:  98.4 F (36.9 C)   TempSrc:  Oral   SpO2:  99% 100%  Weight: 90.1 kg    Height: 6' (1.829 m)     General: Not in acute distress.  Pale looking  HEENT:       Eyes: PERRL, EOMI, no scleral icterus.       ENT: No discharge from the ears and nose, no pharynx injection, no tonsillar enlargement.        Neck: No JVD, no bruit, no mass felt. Heme: No neck lymph node enlargement. Cardiac: S1/S2, RRR, No murmurs, No gallops or rubs. Respiratory: No rales, wheezing, rhonchi or rubs. GI: Soft, nondistended, nontender, no rebound  pain, no organomegaly, BS present. GU: No hematuria Ext: No pitting leg edema bilaterally. 2+DP/PT pulse bilaterally. Musculoskeletal: No joint deformities, No joint redness or warmth, no limitation of ROM in spin. Skin: No rashes.  Neuro: Alert, oriented X3, cranial nerves II-XII grossly intact, moves all extremities normally.  Psych: Patient is not psychotic, no suicidal or hemocidal ideation.  Labs on Admission: I have personally reviewed following labs and imaging studies  CBC: Recent Labs  Lab 05/10/19 1156 05/10/19 1436  WBC 7.2 5.3  HGB 6.6* 6.2*  HCT 21.6* 19.9*  MCV 91.9 92.1  PLT 172 [id number]   Basic Metabolic Panel: Recent Labs  Lab 05/10/19 1156  NA 140  K 4.1  CL 108  CO2 23  GLUCOSE 148*  BUN 22  CREATININE 1.34*  CALCIUM 8.8*   GFR: Estimated Creatinine Clearance: 35.2 mL/min (A) (by C-G formula based on SCr of 1.34 mg/dL (H)). Liver Function Tests: Recent Labs  Lab 05/10/19 1156  AST 17  ALT 10  ALKPHOS 53  BILITOT 0.5  PROT 6.4*  ALBUMIN 3.6   No results for input(s): LIPASE, AMYLASE in the last 168 hours. No results for input(s): AMMONIA in the last 168 hours. Coagulation Profile: Recent Labs  Lab 05/10/19 1156  INR 1.4*   Cardiac Enzymes: No results for input(s): CKTOTAL, CKMB, CKMBINDEX, TROPONINI in the last 168 hours. BNP (last 3 results) No results for input(s): PROBNP in the last 8760 hours. HbA1C: No results for input(s): HGBA1C in the last 72 hours. CBG: No results for input(s): GLUCAP in the last 168 hours. Lipid Profile: No results for input(s): CHOL, HDL, LDLCALC, TRIG, CHOLHDL, LDLDIRECT in the last 72 hours. Thyroid Function Tests: No results for input(s): TSH, T4TOTAL, FREET4, T3FREE, THYROIDAB in the last 72 hours. Anemia Panel: No results for input(s): VITAMINB12, FOLATE, FERRITIN, TIBC, IRON, RETICCTPCT in the last 72 hours. Urine analysis:    Component Value Date/Time   COLORURINE YELLOW 05/08/2017 1057    APPEARANCEUR CLOUDY (A) 05/08/2017 1057   LABSPEC 1.021 05/08/2017 1057   PHURINE 5.0 05/08/2017 1057   GLUCOSEU NEGATIVE 05/08/2017 1057   HGBUR NEGATIVE 05/08/2017 1057   BILIRUBINUR NEGATIVE 05/08/2017 1057   KETONESUR NEGATIVE 05/08/2017 1057   PROTEINUR NEGATIVE 05/08/2017 1057   NITRITE NEGATIVE 05/08/2017 1057   LEUKOCYTESUR MODERATE (A)  05/08/2017 1057   Sepsis Labs: @LABRCNTIP (procalcitonin:4,lacticidven:4) )No results found for this or any previous visit (from the past 240 hour(s)).   Radiological Exams on Admission: No results found.   EKG: Independently reviewed.  Atrial fibrillation, QTc 456, mild T wave inversion in inferior leads, and precordial leads.  Assessment/Plan Principal Problem:   GIB (gastrointestinal bleeding) Active Problems:   Chronic diastolic CHF (congestive heart failure) (H[facility])   HTN (hypertension)   Permanent atrial fibrillation (H[facility])   GERD (gastroesophageal reflux disease)   Coronary artery disease involving native coronary artery of native heart   Hypothyroidism   HLD (hyperlipidemia)   Anemia due to blood loss   CKD (chronic kidney disease), stage IIIa   GIB (gastrointestinal bleeding): Hgb 13.1 -->6.6.  Currently hemodynamically stable. Dr. Vicente Males of GI is consulted - will admit to tele bed as inpt - hold Eliquis - transfuse 2 units of blood now - GI consulted by Ed, will follow up recommendations - NPO  - Start IV pantoprazole gtt - Zofran IV for nausea - Avoid NSAIDs and SQ heparin - Maintain IV access (2 large bore IVs if possible). - Monitor closely and follow q6h cbc, transfuse as necessary, if Hgb<7.0 - LaB: INR, PTT and type screen  Chronic diastolic CHF (congestive heart failure) ([place]): 2D echo on 05/20/2018 showed EF> 65%.  Patient does not have leg edema JVD.  CHF seem to be compensated. -Continue home torsemide -check BNP  HTN:  -Continue home medications: Diltiazem, torsemide -hydralazine prn  Permanent atrial  fibrillation ([place]): -hold Eliquis -continue Cardizem  GERD (gastroesophageal reflux disease): -on protonix  Coronary artery disease involving native coronary artery of native heart: no CP -continue pravastatin  Hypothyroidism -Synthroid  HLD (hyperlipidemia) -Pravastatin  Anemia due to blood loss: -Blood transfusion as above -Continue iron supplement  CKD (chronic kidney disease), stage IIIa: Stable.  Baseline creatinine 1.0-1.3.  Her creatinine is 1.34, BUN 22 -Follow-up with BMP     Inpatient status:  # Patient requires inpatient status due to high intensity of service, high risk for further deterioration and high frequency of surveillance required.  I certify that at the point of admission it is my clinical judgment that the patient will require inpatient hospital care spanning beyond 2 midnights from the point of admission.  . This patient has multiple chronic comorbidities including hypertension, hyperlipidemia, GERD, hypothyroidism, depression, stomach cancer 2013, s/p of TAVR, PVD, atrial fibrillation on Eliquis, CKD stage III, dCHF, anemia . Now patient has presenting with GIB with worsening anemia . The worrisome physical exam findings include pale looking . The initial radiographic and laboratory data are worrisome because of hemoglobin dropped from 13.1 to 6.2 . Current medical needs: please see my assessment and plan . Predictability of an adverse outcome (risk): Marland Kitchen Patient has multiple comorbidities as listed above. Now presents with GIB with worsening anemia, with hemoglobin dropped from 13.1 to 6.2. Patient's presentation is highly complicated. She was on Eliquis, and is at high risk of developing massive bleeding. Will need to be treated in hospital for at least 2 days.       \    DVT ppx: SCD Code Status: Full code Family Communication: No, no one at bed side.  Disposition Plan:  Anticipate discharge back to previous home environment Consults called:   Dr. Vicente Males of GI Admission status: Med-surg as inpt    Date of Service 05/10/2019    [facility]   If 7PM-7AM, please contact night-coverage [email or web address] 05/10/2019, 5:43 PM

## 2019-05-10 NOTE — ED Triage Notes (Signed)
C/O generalized weakness x 2-3 weeks.  States was at sons house yesterday and almost passed out and states noticed black stools x 1 day.  AAOx3.  Skin warm and dry. NAD

## 2019-05-10 NOTE — ED Notes (Signed)
Resumed care from [place] rn.  Pt alert   prbc's infusing.  Pt waiting on admission.

## 2019-05-10 NOTE — ED Notes (Signed)
Pt watching tv.  No acute distress.

## 2019-05-10 NOTE — ED Provider Notes (Signed)
[facility] Emergency Department Provider Note   ____________________________________________   First MD Initiated Contact with Patient 05/10/19 1250     (approximate)  I have reviewed the triage vital signs and the nursing notes.   HISTORY  Chief Complaint GI Bleeding    HPI Theresa Cain is a 86 y.o. female history of CHF, chronic kidney disease, previous gastrointestinal surgery due to what she describes a stomach cancer several years ago  She reports has been feeling a bit fatigued or weak over the last 1 to 2 weeks.  She reports it felt somewhat similar to how she felt when she was first diagnosed with stomach cancer and that she just felt very fatigued and then they found she had a low blood count leading to her diagnosis years ago.  However, over this weekend she reports she started feeling lightheaded with standing, had a bloody bowel movement, and for the last couple of days she has had very dark black stools with some blood clots with the last bowel movement being this morning.  She reports she feels like she is bleeding in her blood counts have likely dropped as she felt the same years ago when she needed to receive a couple pints of blood  No abdominal pain.  No nausea no vomiting.  He ate a small breakfast and supper this morning.  Feeling lightheaded especially with walking or standing over the last several days.  Seems to be getting a little worse.  No exposure to Covid.  She has had both of her Covid vaccines and denies any symptoms such as cough fevers chills runny nose or body aches   Past Medical History:  Diagnosis Date  . Acute on chronic diastolic CHF (congestive heart failure) ([place]) 02/22/2016  . Anemia   . Bradycardia   . Carotid artery occlusion   . Chronic diastolic CHF (congestive heart failure) ([place])   . Chronic kidney disease    elevated creatinne  . Coronary artery disease involving native coronary artery of native heart   .  GERD (gastroesophageal reflux disease)   . HLD (hyperlipidemia)   . Hypertension   . Hypothyroidism   . Obesity   . Osteoarthritis   . Permanent atrial fibrillation ([place])   . Presence of permanent cardiac pacemaker   . PVD (peripheral vascular disease) ([place])   . 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 Severe aortic stenosis   . Stomach cancer ([place]) ~ 2013  . Vertigo     Patient Active Problem List   Diagnosis Date Noted  . 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 Chronic kidney disease   . Severe aortic stenosis   . Coronary artery disease involving native coronary artery of native heart   . Chronic diastolic CHF (congestive heart failure) ([place]) 03/23/2017  . HTN (hypertension) 03/23/2017  . Permanent atrial fibrillation ([place]) 03/23/2017  . GERD (gastroesophageal reflux disease) 03/23/2017    Past Surgical History:  Procedure Laterality Date  . CARDIOVASCULAR STRESS TEST    . CARDIOVERSION N/A 08/08/2016   Procedure: CARDIOVERSION;  Surgeon: Sueanne Margarita, MD;  [facility]: [facility] ENDOSCOPY;  Service: Cardiovascular;  Laterality: N/A;  . CAROTID ENDARTERECTOMY Right ~ 2005  . CATARACT EXTRACTION W/ INTRAOCULAR LENS IMPLANT Right   . CHOLECYSTECTOMY  ~ 2013   "part of her Whipple OR"  . EP IMPLANTABLE DEVICE N/A 03/04/2016  Procedure: Pacemaker Implant;  Surgeon: Will Meredith Leeds, MD;  Location: [place] CV LAB;  Service: Cardiovascular;  Laterality: N/A;  . EP IMPLANTABLE DEVICE N/A 03/05/2016   Procedure: Lead Revision/Repair;  Surgeon: Deboraha Sprang, MD;  Location: [place] CV LAB;  Service: Cardiovascular;  Laterality: N/A;  . INSERT / REPLACE / REMOVE PACEMAKER    . LEFT HEART CATH AND CORONARY ANGIOGRAPHY N/A 03/27/2017   Procedure: LEFT HEART CATH AND CORONARY ANGIOGRAPHY;  Surgeon: Minna Merritts, MD;  Location:  [place] CV LAB;  Service: Cardiovascular;  Laterality: N/A;  . TEE WITHOUT CARDIOVERSION N/A 05/12/2017   Procedure: TRANSESOPHAGEAL ECHOCARDIOGRAM (TEE);  Surgeon: Sherren Mocha, MD;  Location: [place];  Service: Open Heart Surgery;  Laterality: N/A;  . TRANSCATHETER AORTIC VALVE REPLACEMENT, TRANSFEMORAL N/A 05/12/2017   Procedure: TRANSCATHETER AORTIC VALVE REPLACEMENT, TRANSFEMORAL using a 30mm Edwards Sapien 3 Aortic Valve;  Surgeon: Sherren Mocha, MD;  Location: [place];  Service: Open Heart Surgery;  Laterality: N/A;  . US ECHOCARDIOGRAPHY    . VAGINAL HYSTERECTOMY    . WHIPPLE PROCEDURE  ~ 2013    Prior to Admission medications   Medication Sig Start Date End Date Taking? Authorizing Provider  acetaminophen (TYLENOL) 500 MG tablet Take 1,000 mg by mouth every 6 (six) hours as needed for moderate pain.    [provider]  apixaban (ELIQUIS) 5 MG TABS tablet Take 1 tablet (5 mg total) by mouth 2 (two) times daily. Patient taking differently: Take 2.5 mg by mouth 2 (two) times daily.  01/22/18   Minna Merritts, MD  clindamycin (CLEOCIN) 300 MG capsule Take 30-60 minutes prior to dental visit. 06/09/17   Sherren Mocha, MD  diltiazem (CARDIZEM CD) 180 MG 24 hr capsule Take 1 capsule (180 mg total) by mouth daily. 03/27/17   Vaughan Basta, MD  escitalopram (LEXAPRO) 10 MG tablet Take 10 mg by mouth at bedtime.     [provider]  ferrous sulfate 325 (65 FE) MG tablet Take 325 mg by mouth daily with breakfast.    [provider]  gabapentin (NEURONTIN) 600 MG tablet Take 600 mg by mouth 2 (two) times daily.     [provider]  levothyroxine (SYNTHROID) 50 MCG tablet Take 1 tablet (50 mcg total) by mouth daily. 03/27/17 03/27/18  Vaughan Basta, MD  omeprazole (PRILOSEC) 20 MG capsule Take 20 mg by mouth daily.    [provider]  potassium chloride (K-DUR) 10 MEQ tablet Take 1 tablet (10 mEq total) by mouth 2 (two) times daily.  03/27/17   Vaughan Basta, MD  pravastatin (PRAVACHOL) 40 MG tablet Take 40 mg by mouth every evening.     [provider]  torsemide (DEMADEX) 20 MG tablet Take 1 tablet (20 mg total) by mouth daily. Take one additional tablet daily as needed for swelling. 04/01/17   Eileen Stanford, PA-C  Vitamin D, Ergocalciferol, (DRISDOL) 50000 units CAPS capsule Take 50,000 Units by mouth 2 (two) times a week. Take on Sunday and Thursday    [provider]    Allergies Penicillins and Sulfa antibiotics  Family History  Problem Relation Age of Onset  . Heart disease Sister     Social History Social History   Tobacco Use  . Smoking status: Never Smoker  . Smokeless tobacco: Never Used  Substance Use Topics  . Alcohol use: No  . Drug use: No    Review of Systems Constitutional: No fever/chills Eyes: No visual changes. ENT:  No sore throat. Cardiovascular: Denies chest pain. Respiratory: Denies shortness of breath. Gastrointestinal: No abdominal pain.  See HPI. Genitourinary: Negative for dysuria. Musculoskeletal: Negative for body aches or pains. Skin: Negative for rash. Neurological: Negative for weakness except for lightheadedness, gets burning in both legs chronically treated with gabapentin.    ____________________________________________   PHYSICAL EXAM:  VITAL SIGNS: ED Triage Vitals  Enc Vitals Group     BP 05/10/19 1147 (!) 132/52     Pulse Rate 05/10/19 1147 64     Resp 05/10/19 1147 16     Temp 05/10/19 1147 98.4 F (36.9 C)     Temp Source 05/10/19 1147 Oral     SpO2 05/10/19 1147 99 %     Weight 05/10/19 1144 198 lb 9.6 oz (90.1 kg)     Height 05/10/19 1144 6' (1.829 m)     Head Circumference --      Peak Flow --      Pain Score 05/10/19 1144 0     Pain Loc --      Pain Edu? --      Excl. in [place]? --     Constitutional: Alert and oriented. Well appearing and in no acute distress. Eyes: Conjunctivae are normal. Head:  Atraumatic. Nose: No congestion/rhinnorhea. Mouth/Throat: Mucous membranes are moist. Neck: No stridor.  Cardiovascular: Normal rate, regular rhythm. Grossly normal heart sounds.  Good peripheral circulation. Respiratory: Normal respiratory effort.  No retractions. Lungs CTAB. Gastrointestinal: Soft and nontender. No distention.  No discomfort in any quadrant. Musculoskeletal: No lower extremity tenderness nor edema. Neurologic:  Normal speech and language. No gross focal neurologic deficits are appreciated.  Skin:  Skin is warm, dry and intact. No rash noted. Psychiatric: Mood and affect are normal. Speech and behavior are normal.  ____________________________________________   LABS (all labs ordered are listed, but only abnormal results are displayed)  Labs Reviewed  COMPREHENSIVE METABOLIC PANEL - Abnormal; Notable for the following components:      Result Value   Glucose, Bld 148 (*)    Creatinine, Ser 1.34 (*)    Calcium 8.8 (*)    Total Protein 6.4 (*)    GFR calc non Af Amer 35 (*)    GFR calc Af Amer 40 (*)    All other components within normal limits  CBC - Abnormal; Notable for the following components:   RBC 2.35 (*)    Hemoglobin 6.6 (*)    HCT 21.6 (*)    RDW 16.2 (*)    All other components within normal limits  PROTIME-INR - Abnormal; Notable for the following components:   Prothrombin Time 16.7 (*)    INR 1.4 (*)    All other components within normal limits  SARS CORONAVIRUS 2 (TAT 6-24 HRS)  POC OCCULT BLOOD, ED  TYPE AND SCREEN  PREPARE RBC (CROSSMATCH)  ABO/RH   ____________________________________________  EKG  Reviewed inter by me at 1135 Heart rate 70 with A. fib QRS 80 QTc 450 T wave inversions in inferolateral anterior distribution.  This appears different than her previous EKGs which have demonstrated ventricular pacing. ____________________________________________  RADIOLOGY  No acute indication for imaging denoted at this time.   Patient denies any associate abdominal pain.  Abdomen soft nontender nondistended.  ____________________________________________   PROCEDURES  Procedure(s) performed: None  Procedures  Critical Care performed: Yes, see critical care note(s)  CRITICAL CARE Performed by: Delman Kitten   Total critical care time: 30 minutes  Critical care time was exclusive of  separately billable procedures and treating other patients.  Critical care was necessary to treat or prevent imminent or life-threatening deterioration.  Critical care was time spent personally by me on the following activities: development of treatment plan with patient and/or surrogate as well as nursing, discussions with consultants, evaluation of patient's response to treatment, examination of patient, obtaining history from patient or surrogate, ordering and performing treatments and interventions, ordering and review of laboratory studies, ordering and review of radiographic studies, pulse oximetry and re-evaluation of patient's condition.  ____________________________________________   INITIAL IMPRESSION / ASSESSMENT AND PLAN / ED COURSE  Pertinent labs & imaging results that were available during my care of the patient were reviewed by me and considered in my medical decision making (see chart for details).   Patient presents for evaluation of described GI bleeding.  Associated with lightheadedness generalized fatigue.  Seems to been ongoing for at least a few days with first bloody stool noted on a roughly Saturday.  She is continued to have very dark black stools and some clot passage as recent as this morning.  History of previous gastrointestinal bleeding and GI surgeries.  She is also a cardiac patient on anticoagulation, last taking her anticoagulant this morning.  She currently hemodynamically stable.  Discussed with the patient, she has both verbally and willing to sign written consent having discussed risks  benefits alternatives and complications of blood transfusion.  I have consulted gastroenterology, Dr. Vicente Males (1PM), who will see and evaluate the patient.  Discussed with the patient, she is agreeable and understanding with plan for admission, blood transfusion, consultation with GI.  Clinical Course as of May 09 1721  Tue May 10, 2019  1356 Patient resting comfortably, no distress.  Discussed with nurse, preparing to set up for blood transfusions.   [MQ]  [id number] Admission discussed with hospitalist Dr. Blaine Hamper, is accepted.   [MQ]    Clinical Course User Index [MQ] Delman Kitten, MD     ____________________________________________   FINAL CLINICAL IMPRESSION(S) / ED DIAGNOSES  Final diagnoses:  Acute GI bleeding        Note:  This document was prepared using Dragon voice recognition software and may include unintentional dictation errors       Delman Kitten, MD 05/10/19 1723

## 2019-05-10 NOTE — Consult Note (Signed)
Jonathon Bellows , MD [address] [place], [place], [place], [address] [address], [place], [place], [place], [address] Phone: [phone]  Fax: [phone]  Consultation  Referring Provider:    ER Primary Care Physician:  Kendrick Ranch, MD Primary Gastroenterologist:  None          Reason for Consultation:     GI bleed  Date of Admission:  05/10/2019 Date of Consultation:  05/10/2019         HPI:   Theresa Cain is a 86 y.o. female presented to the ER earlier today with fatugue. H/o gastric cancer and prior ?Whipples surgery for the same . Says she noticed her stool to be black since yesterday. 2 black bowel movements so far. No hematemesis. No NSAID use or abdominal pain. No similar issue in the past. C/o dizines when standing up   05/10/2019: BUN of 22 creatinine of 1.34.  Hemoglobin of 6.6 g and an MCV of 91.9.  1 year back hemoglobin is 13.1 g per few months prior to that was 9.9 g.  INR 1.4  Past Medical History:  Diagnosis Date  . Acute on chronic diastolic CHF (congestive heart failure) ([place]) 02/22/2016  . Anemia   . Bradycardia   . Carotid artery occlusion   . Chronic diastolic CHF (congestive heart failure) ([place])   . Chronic kidney disease    elevated creatinne  . Coronary artery disease involving native coronary artery of native heart   . GERD (gastroesophageal reflux disease)   . HLD (hyperlipidemia)   . Hypertension   . Hypothyroidism   . Obesity   . Osteoarthritis   . Permanent atrial fibrillation ([place])   . Presence of permanent cardiac pacemaker   . PVD (peripheral vascular disease) ([place])   . 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 Severe aortic stenosis   . Stomach cancer ([place]) ~ 2013  . Vertigo     Past Surgical History:  Procedure Laterality Date  . CARDIOVASCULAR STRESS TEST    . CARDIOVERSION N/A 08/08/2016   Procedure: CARDIOVERSION;  Surgeon: Sueanne Margarita, MD;  Location: [facility] ENDOSCOPY;  Service: Cardiovascular;  Laterality: N/A;  . CAROTID ENDARTERECTOMY Right ~ 2005  . CATARACT EXTRACTION W/ INTRAOCULAR LENS IMPLANT Right   . CHOLECYSTECTOMY  ~ 2013   "part of her [facility] OR"  . EP IMPLANTABLE DEVICE N/A 03/04/2016   Procedure: Pacemaker Implant;  Surgeon: Will Meredith Leeds, MD;  Location: [place] CV LAB;  Service: Cardiovascular;  Laterality: N/A;  . EP IMPLANTABLE DEVICE N/A 03/05/2016   Procedure: Lead Revision/Repair;  Surgeon: Deboraha Sprang, MD;  Location: [place] CV LAB;  Service: Cardiovascular;  Laterality: N/A;  . INSERT / REPLACE / REMOVE PACEMAKER    . LEFT HEART CATH AND CORONARY ANGIOGRAPHY N/A 03/27/2017   Procedure: LEFT HEART CATH AND CORONARY ANGIOGRAPHY;  Surgeon: Minna Merritts, MD;  Location: [place] CV LAB;  Service: Cardiovascular;  Laterality: N/A;  . TEE WITHOUT CARDIOVERSION N/A 05/12/2017   Procedure: TRANSESOPHAGEAL ECHOCARDIOGRAM (TEE);  Surgeon: Sherren Mocha, MD;  Location: [place];  Service: Open Heart Surgery;  Laterality: N/A;  . TRANSCATHETER AORTIC VALVE REPLACEMENT, TRANSFEMORAL N/A 05/12/2017   Procedure: TRANSCATHETER AORTIC VALVE REPLACEMENT, TRANSFEMORAL using a 36mm Edwards Sapien 3 Aortic Valve;  Surgeon: Sherren Mocha, MD;  Location: [place];  Service: Open Heart Surgery;  Laterality: N/A;  .  US ECHOCARDIOGRAPHY    . VAGINAL HYSTERECTOMY    . WHIPPLE PROCEDURE  ~ 2013    Prior to Admission medications   Medication Sig Start Date End Date Taking? Authorizing Provider  acetaminophen (TYLENOL) 500 MG tablet Take 1,000 mg by mouth every 6 (six) hours as needed for moderate pain.   Yes [provider]  apixaban (ELIQUIS) 5 MG TABS tablet Take 1 tablet (5 mg total) by mouth 2 (two) times daily. Patient taking differently: Take 2.5 mg by mouth 2 (two) times daily.  01/22/18  Yes Minna Merritts, MD  diltiazem (CARDIZEM CD) 180 MG  24 hr capsule Take 1 capsule (180 mg total) by mouth daily. 03/27/17  Yes Vaughan Basta, MD  escitalopram (LEXAPRO) 10 MG tablet Take 10 mg by mouth at bedtime.    Yes [provider]  ferrous sulfate 325 (65 FE) MG tablet Take 325 mg by mouth daily with breakfast.   Yes [provider]  levothyroxine (SYNTHROID) 50 MCG tablet Take 1 tablet (50 mcg total) by mouth daily. 03/27/17 05/10/19 Yes Vaughan Basta, MD  omeprazole (PRILOSEC) 20 MG capsule Take 20 mg by mouth daily.   Yes [provider]  potassium chloride (K-DUR) 10 MEQ tablet Take 1 tablet (10 mEq total) by mouth 2 (two) times daily. 03/27/17  Yes Vaughan Basta, MD  pravastatin (PRAVACHOL) 40 MG tablet Take 40 mg by mouth every evening.    Yes [provider]  torsemide (DEMADEX) 20 MG tablet Take 1 tablet (20 mg total) by mouth daily. Take one additional tablet daily as needed for swelling. 04/01/17  Yes Eileen Stanford, PA-C  Vitamin D, Ergocalciferol, (DRISDOL) 50000 units CAPS capsule Take 50,000 Units by mouth 2 (two) times a week. Take on Sunday and Thursday   Yes [provider]    Family History  Problem Relation Age of Onset  . Heart disease Sister      Social History   Tobacco Use  . Smoking status: Never Smoker  . Smokeless tobacco: Never Used  Substance Use Topics  . Alcohol use: No  . Drug use: No    Allergies as of 05/10/2019 - Review Complete 05/10/2019  Allergen Reaction Noted  . Penicillins Hives and Other (See Comments) 02/05/2016  . Sulfa antibiotics Hives 02/05/2016  . Cymbalta [duloxetine hcl]  05/10/2019    Review of Systems:    All systems reviewed and negative except where noted in HPI.   Physical Exam:  Vital signs in last 24 hours: Temp:  [98.4 F (36.9 C)] 98.4 F (36.9 C) (02/16 1147) Pulse Rate:  [64] 64 (02/16 1147) Resp:  [16] 16 (02/16 1147) BP: (132)/(52) 132/52 (02/16 1147) SpO2:  [99 %] 99 % (02/16 1147) Weight:   [90.1 kg] 90.1 kg (02/16 1144)   General:   Pleasant, cooperative in NAD Head:  Normocephalic and atraumatic. Eyes:   No icterus.   Conjunctiva pink. PERRLA. Ears:  Normal auditory acuity. Neck:  Supple; no masses or thyroidomegaly Lungs: Respirations even and unlabored. Lungs clear to auscultation bilaterally.   No wheezes, crackles, or rhonchi.  Heart:  Regular rate and rhythm; systolic murmur Abdomen:  Soft, nondistended, nontender. Normal bowel sounds. No appreciable masses or hepatomegaly.  No rebound or guarding.  Neurologic:  Alert and oriented x3;  grossly normal neurologically. Psych:  Alert and cooperative. Normal affect.  LAB RESULTS: Recent Labs    05/10/19 1156  WBC 7.2  HGB 6.6*  HCT 21.6*  PLT 172  BMET Recent Labs    05/10/19 1156  NA 140  K 4.1  CL 108  CO2 23  GLUCOSE 148*  BUN 22  CREATININE 1.34*  CALCIUM 8.8*   LFT Recent Labs    05/10/19 1156  PROT 6.4*  ALBUMIN 3.6  AST 17  ALT 10  ALKPHOS 53  BILITOT 0.5   PT/INR Recent Labs    05/10/19 1156  LABPROT 16.7*  INR 1.4*    STUDIES: No results found.    Impression / Plan:   Theresa Cain is a 86 y.o. y/o female whom I have been consulted for a GI bleed hemoglobin is lower than baseline at 6.6 g.  No rise in BUN/creatinine ratio.  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   Plan  1. EGD on Friday - 2 days off eloquis 2. Monitor CBC and transfuse as needed 3. IV PPI  I have discussed alternative options, risks & benefits,  which include, but are not limited to, bleeding, infection, perforation,respiratory complication & drug reaction.  The patient agrees with this plan & written consent will be obtained.     Thank you for involving me in the care of this patient.      LOS: 0 days   Jonathon Bellows,  MD  05/10/2019, 2:08 PM

## 2019-05-10 NOTE — ED Notes (Signed)
Please call pt daughter when pt has bed assigned and give her the room and phone number.

## 2019-05-10 NOTE — ED Notes (Signed)
IV team is at the bedside and placing a midline. Will get PRBC when bedside procedure is done.

## 2019-05-10 NOTE — ED Notes (Signed)
Pt moved to room 38 in cpod.  Report off to matt rn floor nurse.

## 2019-05-10 NOTE — ED Notes (Signed)
Unsuccessful attempt x 3 for iv.  Pt tolerated well.

## 2019-05-11 ENCOUNTER — Encounter: Payer: Self-pay | Admitting: Internal Medicine

## 2019-05-11 ENCOUNTER — Other Ambulatory Visit: Payer: Self-pay

## 2019-05-11 DIAGNOSIS — N1832 Chronic kidney disease, stage 3b: Secondary | ICD-10-CM

## 2019-05-11 DIAGNOSIS — I10 Essential (primary) hypertension: Secondary | ICD-10-CM

## 2019-05-11 DIAGNOSIS — E785 Hyperlipidemia, unspecified: Secondary | ICD-10-CM

## 2019-05-11 LAB — CBC
HCT: 25.2 % — ABNORMAL LOW (ref 36.0–46.0)
HCT: 24.1 % — ABNORMAL LOW (ref 36.0–46.0)
HCT: 24.8 % — ABNORMAL LOW (ref 36.0–46.0)
HCT: 24 % — ABNORMAL LOW (ref 36.0–46.0)
Hemoglobin: 7.8 g/dL — ABNORMAL LOW (ref 12.0–15.0)
Hemoglobin: 7.7 g/dL — ABNORMAL LOW (ref 12.0–15.0)
Hemoglobin: 7.9 g/dL — ABNORMAL LOW (ref 12.0–15.0)
Hemoglobin: 7.6 g/dL — ABNORMAL LOW (ref 12.0–15.0)
MCH: 28.3 pg (ref 26.0–34.0)
MCH: 28.7 pg (ref 26.0–34.0)
MCH: 28.7 pg (ref 26.0–34.0)
MCH: 28.6 pg (ref 26.0–34.0)
MCHC: 31 g/dL (ref 30.0–36.0)
MCHC: 32 g/dL (ref 30.0–36.0)
MCHC: 31.9 g/dL (ref 30.0–36.0)
MCHC: 31.7 g/dL (ref 30.0–36.0)
MCV: 91.3 fL (ref 80.0–100.0)
MCV: 89.9 fL (ref 80.0–100.0)
MCV: 90.2 fL (ref 80.0–100.0)
MCV: 90.2 fL (ref 80.0–100.0)
Platelets: 125 10*3/uL — ABNORMAL LOW (ref 150–400)
Platelets: 132 10*3/uL — ABNORMAL LOW (ref 150–400)
Platelets: 145 10*3/uL — ABNORMAL LOW (ref 150–400)
Platelets: 135 10*3/uL — ABNORMAL LOW (ref 150–400)
RBC: 2.76 MIL/uL — ABNORMAL LOW (ref 3.87–5.11)
RBC: 2.68 MIL/uL — ABNORMAL LOW (ref 3.87–5.11)
RBC: 2.75 MIL/uL — ABNORMAL LOW (ref 3.87–5.11)
RBC: 2.66 MIL/uL — ABNORMAL LOW (ref 3.87–5.11)
RDW: 15.2 % (ref 11.5–15.5)
RDW: 15.7 % — ABNORMAL HIGH (ref 11.5–15.5)
RDW: 15.5 % (ref 11.5–15.5)
RDW: 15.4 % (ref 11.5–15.5)
WBC: 7.7 10*3/uL (ref 4.0–10.5)
WBC: 7.5 10*3/uL (ref 4.0–10.5)
WBC: 7.6 10*3/uL (ref 4.0–10.5)
WBC: 7.2 10*3/uL (ref 4.0–10.5)
nRBC: 0 % (ref 0.0–0.2)
nRBC: 0 % (ref 0.0–0.2)
nRBC: 0 % (ref 0.0–0.2)
nRBC: 0 % (ref 0.0–0.2)

## 2019-05-11 LAB — TYPE AND SCREEN
ABO/RH(D): B NEG
Antibody Screen: NEGATIVE
Unit division: 0
Unit division: 0

## 2019-05-11 LAB — BASIC METABOLIC PANEL
Anion gap: 8 (ref 5–15)
BUN: 19 mg/dL (ref 8–23)
CO2: 24 mmol/L (ref 22–32)
Calcium: 8.3 mg/dL — ABNORMAL LOW (ref 8.9–10.3)
Chloride: 109 mmol/L (ref 98–111)
Creatinine, Ser: 1.19 mg/dL — ABNORMAL HIGH (ref 0.44–1.00)
GFR calc Af Amer: 47 mL/min — ABNORMAL LOW (ref >60)
GFR calc non Af Amer: 40 mL/min — ABNORMAL LOW (ref >60)
Glucose, Bld: 150 mg/dL — ABNORMAL HIGH (ref 70–99)
Potassium: 3.9 mmol/L (ref 3.5–5.1)
Sodium: 141 mmol/L (ref 135–145)

## 2019-05-11 LAB — BPAM RBC
Blood Product Expiration Date: 202103052359
Blood Product Expiration Date: 202103262359
ISSUE DATE / TIME: 202102161754
ISSUE DATE / TIME: 202102162154
Unit Type and Rh: 1700
Unit Type and Rh: 1700

## 2019-05-11 LAB — GLUCOSE, CAPILLARY
Glucose-Capillary: 148 mg/dL — ABNORMAL HIGH (ref 70–99)
Glucose-Capillary: 127 mg/dL — ABNORMAL HIGH (ref 70–99)
Glucose-Capillary: 129 mg/dL — ABNORMAL HIGH (ref 70–99)

## 2019-05-11 MED ORDER — SODIUM CHLORIDE 0.9% FLUSH
10.0000 mL | Freq: Two times a day (BID) | INTRAVENOUS | Status: DC
  Administered 2019-05-11 – 2019-05-14 (×9): 10 mL

## 2019-05-11 MED ORDER — SODIUM CHLORIDE 0.9% FLUSH: 10.0000 mL | INTRAVENOUS | Status: DC | PRN

## 2019-05-11 MED ORDER — CHLORHEXIDINE GLUCONATE CLOTH 2 % EX PADS
6.0000 | MEDICATED_PAD | Freq: Every day | CUTANEOUS | Status: DC
  Administered 2019-05-11 – 2019-05-15 (×5): 6 via TOPICAL

## 2019-05-11 NOTE — Progress Notes (Signed)
Report given. Transport put in for xfer to 2a

## 2019-05-11 NOTE — Plan of Care (Signed)
  Problem: Health Behavior/Discharge Planning: Goal: Ability to manage health-related needs will improve Outcome: Progressing   Problem: Education: Goal: Ability to demonstrate management of disease process will improve Outcome: Progressing Goal: Ability to verbalize understanding of medication therapies will improve Outcome: Progressing   Problem: Cardiac: Goal: Ability to achieve and maintain adequate cardiopulmonary perfusion will improve Outcome: Progressing Patient verbalizes importance of daily weight.

## 2019-05-11 NOTE — Progress Notes (Signed)
All home medications packaged and delivered to pharmacy at this time by this nurse. Patient aware.

## 2019-05-11 NOTE — Progress Notes (Signed)
PROGRESS NOTE    Theresa Cain  [id number] DOB: 02-24-1927 DOA: 05/10/2019 PCP: Kendrick Ranch, MD    Brief Narrative:  Theresa Cain is a 86 y.o. female with medical history significant of hypertension, hyperlipidemia, GERD, hypothyroidism, depression, stomach cancer 2013, s/p of TAVR, PVD, atrial fibrillation on Eliquis, CKD stage III, dCHF, anemia, who presents with generalized weakness and black stool.pt was found to have hemoglobin dropped from 13.1 on 09/12/2017 to 6.6, 6.2, INR 1.4, lactic acid 1.4, renal function close to baseline, temperature normal, blood pressure 132/52, heart rate 64, RR 16, oxygen saturation 99% on room air.  Patient is admitted to telemetry bed as inpatient.  GI, Dr. Marylene Land was consulted.    Consultants:   GI  Procedures: None  Antimicrobials:   None   Subjective: Patient has no complaints.  Denies abdominal pain, nausea or vomiting, shortness of breath or chest pain.  Feels better posttransfusion  Objective: Vitals:   05/11/19 0513 05/11/19 0721 05/11/19 1154 05/11/19 1639  BP: (!) 159/73 (!) 140/49 (!) 118/37 (!) 136/40  Pulse: 62 (!) 59 60 64  Resp: 19 18 18 18   Temp: 97.7 F (36.5 C) 97.6 F (36.4 C) 97.8 F (36.6 C) 97.9 F (36.6 C)  TempSrc: Oral Oral  Oral  SpO2: 97% 99% 96% 96%  Weight:      Height:        Intake/Output Summary (Last 24 hours) at 05/11/2019 1655 Last data filed at 05/11/2019 1640 Gross per 24 hour  Intake 1140 ml  Output 2100 ml  Net -960 ml   Filed Weights   05/10/19 1144  Weight: 90.1 kg    Examination:  General exam: Appears calm and comfortable  Respiratory system: Clear to auscultation. Respiratory effort normal. Cardiovascular system: S1 & S2 heard, RRR. No JVD, murmurs, rubs, gallops or clicks.  Gastrointestinal system: Abdomen is nondistended, soft and nontender.  Normal bowel sounds heard. Central nervous system: Alert and oriented.  Cranial nerve grossly intact Extremities: No  edema Skin: Warm dry Psychiatry: Judgement and insight appear normal. Mood & affect appropriate.     Data Reviewed: I have personally reviewed following labs and imaging studies  CBC: Recent Labs  Lab 05/10/19 1156 05/10/19 1436 05/11/19 0200 05/11/19 0920 05/11/19 1240  WBC 7.2 5.3 7.7 7.5 7.6  HGB 6.6* 6.2* 7.8* 7.7* 7.9*  HCT 21.6* 19.9* 25.2* 24.1* 24.8*  MCV 91.9 92.1 91.3 89.9 90.2  PLT 172 130* 125* 132* Q000111Q*   Basic Metabolic Panel: Recent Labs  Lab 05/10/19 1156 05/11/19 0200  NA 140 141  K 4.1 3.9  CL 108 109  CO2 23 24  GLUCOSE 148* 150*  BUN 22 19  CREATININE 1.34* 1.19*  CALCIUM 8.8* 8.3*   GFR: Estimated Creatinine Clearance: 39.6 mL/min (A) (by C-G formula based on SCr of 1.19 mg/dL (H)). Liver Function Tests: Recent Labs  Lab 05/10/19 1156  AST 17  ALT 10  ALKPHOS 53  BILITOT 0.5  PROT 6.4*  ALBUMIN 3.6   No results for input(s): LIPASE, AMYLASE in the last 168 hours. No results for input(s): AMMONIA in the last 168 hours. Coagulation Profile: Recent Labs  Lab 05/10/19 1156  INR 1.4*   Cardiac Enzymes: No results for input(s): CKTOTAL, CKMB, CKMBINDEX, TROPONINI in the last 168 hours. BNP (last 3 results) No results for input(s): PROBNP in the last 8760 hours. HbA1C: No results for input(s): HGBA1C in the last 72 hours. CBG: Recent Labs  Lab 05/11/19 0723 05/11/19 1155 05/11/19  [place]   Lipid Profile: No results for input(s): CHOL, HDL, LDLCALC, TRIG, CHOLHDL, LDLDIRECT in the last 72 hours. Thyroid Function Tests: No results for input(s): TSH, T4TOTAL, FREET4, T3FREE, THYROIDAB in the last 72 hours. Anemia Panel: No results for input(s): VITAMINB12, FOLATE, FERRITIN, TIBC, IRON, RETICCTPCT in the last 72 hours. Sepsis Labs: No results for input(s): PROCALCITON, LATICACIDVEN in the last 168 hours.  Recent Results (from the past 240 hour(s))  SARS CORONAVIRUS 2 (TAT 6-24 HRS) Nasopharyngeal  Nasopharyngeal Swab     Status: None   Collection Time: 05/10/19  2:36 PM   Specimen: Nasopharyngeal Swab  Result Value Ref Range Status   SARS Coronavirus 2 NEGATIVE NEGATIVE Final    Comment: (NOTE) SARS-CoV-2 target nucleic acids are NOT DETECTED. The SARS-CoV-2 RNA is generally detectable in upper and lower respiratory specimens during the acute phase of infection. Negative results do not preclude SARS-CoV-2 infection, do not rule out co-infections with other pathogens, and should not be used as the sole basis for treatment or other patient management decisions. Negative results must be combined with clinical observations, patient history, and epidemiological information. The expected result is Negative. Fact Sheet for Patients: SugarRoll.be Fact Sheet for Healthcare Providers: https://www.woods-mathews.com/ This test is not yet approved or cleared by the [place] FDA and  has been authorized for detection and/or diagnosis of SARS-CoV-2 by FDA under an Emergency Use Authorization (EUA). This EUA will remain  in effect (meaning this test can be used) for the duration of the COVID-19 declaration under Section 56 4(b)(1) of the Act, 21 U.S.C. section 360bbb-3(b)(1), unless the authorization is terminated or revoked sooner. Performed at [facility], [place] [address] [place], [place] [address]          Radiology Studies: No results found.      Scheduled Meds: . Chlorhexidine Gluconate Cloth  6 each Topical Daily  . diltiazem  180 mg Oral Daily  . escitalopram  10 mg Oral QHS  . ferrous sulfate  325 mg Oral Q breakfast  . levothyroxine  50 mcg Oral Daily  . [START ON 05/14/2019] pantoprazole  40 mg Intravenous Q12H  . pravastatin  40 mg Oral q1800  . sodium chloride flush  10-40 mL Intracatheter Q12H  . torsemide  20 mg Oral Daily   Continuous Infusions: . pantoprozole (PROTONIX) infusion 8 mg/hr (05/11/19 1427)     Assessment & Plan:   Principal Problem:   GIB (gastrointestinal bleeding) Active Problems:   Chronic diastolic CHF (congestive heart failure) (HCC)   HTN (hypertension)   Permanent atrial fibrillation (HCC)   GERD (gastroesophageal reflux disease)   Coronary artery disease involving native coronary artery of native heart   Hypothyroidism   HLD (hyperlipidemia)   Anemia due to blood loss   CKD (chronic kidney disease), stage IIIa   GIB (gastrointestinal bleeding): Hgb 13.1 -->6.6.  Currently hemodynamically stable.  Dr. Vicente Males of GI is consulted-EGD on Friday -Continue to hold Eliquis -Status post transfusion of  2 units of PRBC - Start IV pantoprazole gtt - Zofran IV for nausea - Avoid NSAIDs and SQ heparin - Maintain IV access (2 large bore IVs if possible). - Monitor closely and follow q6h cbc, transfuse as necessary, if Hgb<7.0   Chronic diastolic CHF (congestive heart failure) ([place]): 2D echo on 05/20/2018 showed EF> 65%.  Currently compensated. -Continue home torsemide   HTN:  -Continue home medications: Diltiazem, torsemide -hydralazine prn  Permanent atrial fibrillation ([place]): -Continue to  hold Eliquis due to GI bleed -continue Cardizem  GERD (gastroesophageal reflux disease): -on protonix  Coronary artery disease involving native coronary artery of native heart: -Currently asymptomatic -continue pravastatin  Hypothyroidism -Continue Synthroid  HLD (hyperlipidemia) -Continue pravastatin  Anemia due to blood loss: -Blood transfusion as above -Continue iron supplement  CKD (chronic kidney disease), stage IIIa: Stable.  Baseline creatinine 1.0-1.3.   DVT prophylaxis: SCD Code Status: Full Family Communication: None at bedside Disposition Plan: Once EGD completed and cleared from GI standpoint on Friday       LOS: 1 day   Time spent: 45 minutes with more than 50% on [place], MD [facility] Pager [phone]  [phone]  If 7PM-7AM, please contact night-coverage [email or web address] Password [facility] 05/11/2019, 4:55 PM

## 2019-05-12 DIAGNOSIS — K922 Gastrointestinal hemorrhage, unspecified: Secondary | ICD-10-CM

## 2019-05-12 LAB — BASIC METABOLIC PANEL
Anion gap: 8 (ref 5–15)
BUN: 19 mg/dL (ref 8–23)
CO2: 25 mmol/L (ref 22–32)
Calcium: 8 mg/dL — ABNORMAL LOW (ref 8.9–10.3)
Chloride: 105 mmol/L (ref 98–111)
Creatinine, Ser: 1.31 mg/dL — ABNORMAL HIGH (ref 0.44–1.00)
GFR calc Af Amer: 41 mL/min — ABNORMAL LOW (ref >60)
GFR calc non Af Amer: 36 mL/min — ABNORMAL LOW (ref >60)
Glucose, Bld: 135 mg/dL — ABNORMAL HIGH (ref 70–99)
Potassium: 3.7 mmol/L (ref 3.5–5.1)
Sodium: 138 mmol/L (ref 135–145)

## 2019-05-12 LAB — CBC
HCT: 23.9 % — ABNORMAL LOW (ref 36.0–46.0)
HCT: 22.9 % — ABNORMAL LOW (ref 36.0–46.0)
HCT: 24.6 % — ABNORMAL LOW (ref 36.0–46.0)
Hemoglobin: 7.5 g/dL — ABNORMAL LOW (ref 12.0–15.0)
Hemoglobin: 7.2 g/dL — ABNORMAL LOW (ref 12.0–15.0)
Hemoglobin: 7.6 g/dL — ABNORMAL LOW (ref 12.0–15.0)
MCH: 28.7 pg (ref 26.0–34.0)
MCH: 28.7 pg (ref 26.0–34.0)
MCH: 27.9 pg (ref 26.0–34.0)
MCHC: 31.4 g/dL (ref 30.0–36.0)
MCHC: 31.4 g/dL (ref 30.0–36.0)
MCHC: 30.9 g/dL (ref 30.0–36.0)
MCV: 91.6 fL (ref 80.0–100.0)
MCV: 91.2 fL (ref 80.0–100.0)
MCV: 90.4 fL (ref 80.0–100.0)
Platelets: 133 10*3/uL — ABNORMAL LOW (ref 150–400)
Platelets: 134 10*3/uL — ABNORMAL LOW (ref 150–400)
Platelets: 145 10*3/uL — ABNORMAL LOW (ref 150–400)
RBC: 2.61 MIL/uL — ABNORMAL LOW (ref 3.87–5.11)
RBC: 2.51 MIL/uL — ABNORMAL LOW (ref 3.87–5.11)
RBC: 2.72 MIL/uL — ABNORMAL LOW (ref 3.87–5.11)
RDW: 15.4 % (ref 11.5–15.5)
RDW: 15.4 % (ref 11.5–15.5)
RDW: 15.6 % — ABNORMAL HIGH (ref 11.5–15.5)
WBC: 6.4 10*3/uL (ref 4.0–10.5)
WBC: 5.6 10*3/uL (ref 4.0–10.5)
WBC: 6.1 10*3/uL (ref 4.0–10.5)
nRBC: 0 % (ref 0.0–0.2)
nRBC: 0 % (ref 0.0–0.2)
nRBC: 0 % (ref 0.0–0.2)

## 2019-05-12 LAB — HEMOGLOBIN AND HEMATOCRIT, BLOOD
HCT: 24.1 % — ABNORMAL LOW (ref 36.0–46.0)
Hemoglobin: 7.6 g/dL — ABNORMAL LOW (ref 12.0–15.0)

## 2019-05-12 LAB — GLUCOSE, CAPILLARY: Glucose-Capillary: 125 mg/dL — ABNORMAL HIGH (ref 70–99)

## 2019-05-12 MED ORDER — SODIUM CHLORIDE 0.9 % IV SOLN: INTRAVENOUS | Status: DC

## 2019-05-12 NOTE — Care Management Important Message (Signed)
Important Message  Patient Details  Name: Theresa Cain MRN: [id number] Date of Birth: [birth year]   Medicare Important Message Given:  Yes     Dannette Barbara 05/12/2019, 1:39 PM

## 2019-05-12 NOTE — Progress Notes (Signed)
PROGRESS NOTE    Theresa Cain  [id number] DOB: Jul 26, 1926 DOA: 05/10/2019 PCP: Kendrick Ranch, MD    Brief Narrative:  Theresa Cain is a 86 y.o. female with medical history significant of hypertension, hyperlipidemia, GERD, hypothyroidism, depression, stomach cancer 2013, s/p of TAVR, PVD, atrial fibrillation on Eliquis, CKD stage III, dCHF, anemia, who presents with generalized weakness and black stool.pt was found to have hemoglobin dropped from 13.1 on 09/12/2017 to 6.6, 6.2, INR 1.4, lactic acid 1.4, renal function close to baseline, temperature normal, blood pressure 132/52, heart rate 64, RR 16, oxygen saturation 99% on room air.  Patient is admitted to telemetry bed as inpatient.  GI, Dr. Marylene Land was consulted.    Consultants:   GI  Procedures: None  Antimicrobials:   None   Subjective: Patient has no complaints. Had bloody bowel movement today Objective: Vitals:   05/11/19 1639 05/11/19 1953 05/12/19 0448 05/12/19 0816  BP: (!) 136/40 (!) 129/48 (!) 125/59 (!) 131/53  Pulse: 64 61 60 62  Resp: 18 19 19 19   Temp: 97.9 F (36.6 C) 97.8 F (36.6 C) 98 F (36.7 C) (!) 97.5 F (36.4 C)  TempSrc: Oral Oral Oral Oral  SpO2: 96% 98% 98% 100%  Weight:      Height:        Intake/Output Summary (Last 24 hours) at 05/12/2019 1527 Last data filed at 05/12/2019 [id number] Gross per 24 hour  Intake 1462.11 ml  Output 950 ml  Net 512.11 ml   Filed Weights   05/10/19 1144  Weight: 90.1 kg    Examination:  General exam: Appears calm and comfortable  Respiratory system: Clear to auscultation. Respiratory effort normal. Cardiovascular system: S1 & S2 heard, RRR. No JVD, murmurs, rubs, gallops or clicks.  Gastrointestinal system: Abdomen is nondistended, soft and nontender.  Normal bowel sounds heard.no guarding Central nervous system: Alert and oriented.  Cranial nerve grossly intact Extremities: No edema or cyanosis Skin: Warm dry Psychiatry: Judgement and  insight appear normal. Mood & affect appropriate.     Data Reviewed: I have personally reviewed following labs and imaging studies  CBC: Recent Labs  Lab 05/11/19 1240 05/11/19 2138 05/12/19 0205 05/12/19 0540 05/12/19 0845  WBC 7.6 7.2 6.4 5.6 6.1  HGB 7.9* 7.6* 7.5* 7.2* 7.6*  HCT 24.8* 24.0* 23.9* 22.9* 24.6*  MCV 90.2 90.2 91.6 91.2 90.4  PLT 145* 135* 133* 134* Q000111Q*   Basic Metabolic Panel: Recent Labs  Lab 05/10/19 1156 05/11/19 0200 05/12/19 0205  NA 140 141 138  K 4.1 3.9 3.7  CL 108 109 105  CO2 23 24 25   GLUCOSE 148* 150* 135*  BUN 22 19 19   CREATININE 1.34* 1.19* 1.31*  CALCIUM 8.8* 8.3* 8.0*   GFR: Estimated Creatinine Clearance: 36 mL/min (A) (by C-G formula based on SCr of 1.31 mg/dL (H)). Liver Function Tests: Recent Labs  Lab 05/10/19 1156  AST 17  ALT 10  ALKPHOS 53  BILITOT 0.5  PROT 6.4*  ALBUMIN 3.6   No results for input(s): LIPASE, AMYLASE in the last 168 hours. No results for input(s): AMMONIA in the last 168 hours. Coagulation Profile: Recent Labs  Lab 05/10/19 1156  INR 1.4*   Cardiac Enzymes: No results for input(s): CKTOTAL, CKMB, CKMBINDEX, TROPONINI in the last 168 hours. BNP (last 3 results) No results for input(s): PROBNP in the last 8760 hours. HbA1C: No results for input(s): HGBA1C in the last 72 hours. CBG: Recent Labs  Lab 05/11/19 0723 05/11/19 1155 05/11/19  1637 05/12/19 0818  GLUCAP 148* 127* 129* 125*   Lipid Profile: No results for input(s): CHOL, HDL, LDLCALC, TRIG, CHOLHDL, LDLDIRECT in the last 72 hours. Thyroid Function Tests: No results for input(s): TSH, T4TOTAL, FREET4, T3FREE, THYROIDAB in the last 72 hours. Anemia Panel: No results for input(s): VITAMINB12, FOLATE, FERRITIN, TIBC, IRON, RETICCTPCT in the last 72 hours. Sepsis Labs: No results for input(s): PROCALCITON, LATICACIDVEN in the last 168 hours.  Recent Results (from the past 240 hour(s))  SARS CORONAVIRUS 2 (TAT 6-24 HRS)  Nasopharyngeal Nasopharyngeal Swab     Status: None   Collection Time: 05/10/19  2:36 PM   Specimen: Nasopharyngeal Swab  Result Value Ref Range Status   SARS Coronavirus 2 NEGATIVE NEGATIVE Final    Comment: (NOTE) SARS-CoV-2 target nucleic acids are NOT DETECTED. The SARS-CoV-2 RNA is generally detectable in upper and lower respiratory specimens during the acute phase of infection. Negative results do not preclude SARS-CoV-2 infection, do not rule out co-infections with other pathogens, and should not be used as the sole basis for treatment or other patient management decisions. Negative results must be combined with clinical observations, patient history, and epidemiological information. The expected result is Negative. Fact Sheet for Patients: SugarRoll.be Fact Sheet for Healthcare Providers: https://www.woods-mathews.com/ This test is not yet approved or cleared by the [place] FDA and  has been authorized for detection and/or diagnosis of SARS-CoV-2 by FDA under an Emergency Use Authorization (EUA). This EUA will remain  in effect (meaning this test can be used) for the duration of the COVID-19 declaration under Section 56 4(b)(1) of the Act, 21 U.S.C. section 360bbb-3(b)(1), unless the authorization is terminated or revoked sooner. Performed at [facility], [place] [address], [place], [place] [address]          Radiology Studies: No results found.      Scheduled Meds: . Chlorhexidine Gluconate Cloth  6 each Topical Daily  . diltiazem  180 mg Oral Daily  . escitalopram  10 mg Oral QHS  . ferrous sulfate  325 mg Oral Q breakfast  . levothyroxine  50 mcg Oral Daily  . [START ON 05/14/2019] pantoprazole  40 mg Intravenous Q12H  . pravastatin  40 mg Oral q1800  . sodium chloride flush  10-40 mL Intracatheter Q12H  . torsemide  20 mg Oral Daily   Continuous Infusions: . sodium chloride    . pantoprozole (PROTONIX)  infusion 8 mg/hr (05/12/19 0956)    Assessment & Plan:   Principal Problem:   GIB (gastrointestinal bleeding) Active Problems:   Chronic diastolic CHF (congestive heart failure) (HCC)   HTN (hypertension)   Permanent atrial fibrillation (HCC)   GERD (gastroesophageal reflux disease)   Coronary artery disease involving native coronary artery of native heart   Hypothyroidism   HLD (hyperlipidemia)   Anemia due to blood loss   CKD (chronic kidney disease), stage IIIa   GIB (gastrointestinal bleeding): Hgb 13.1 -->6.6.  Currently hemodynamically stable.  Dr. Vicente Males of GI is consulted-EGD on Friday -Continue to hold Eliquis -Status post transfusion of  2 units of PRBC - Start IV pantoprazole gtt - Zofran IV for nausea - Avoid NSAIDs and SQ heparin - Monitor closely and follow q6h cbc, transfuse as necessary, if Hgb<7.0   Chronic diastolic CHF (congestive heart failure) ([place]): 2D echo on 05/20/2018 showed EF> 65%.  Currently compensated. -Continue home torsemide   HTN:  -Continue home medications: Diltiazem, torsemide -hydralazine prn  Permanent atrial fibrillation ([place]): -Continue to hold  Eliquis due to GI bleed -continue Cardizem  GERD (gastroesophageal reflux disease): -on protonix  Coronary artery disease involving native coronary artery of native heart: -Currently asymptomatic -continue pravastatin  Hypothyroidism -Continue Synthroid  HLD (hyperlipidemia) -Continue pravastatin  Anemia due to blood loss: -Blood transfusion as above -Continue iron supplement  CKD (chronic kidney disease), stage IIIa: Stable.  Baseline creatinine 1.0-1.3.   DVT prophylaxis: SCD Code Status: Full Family Communication: None at bedside Disposition Plan: Once EGD completed and cleared from GI standpoint on Friday       LOS: 2 days   Time spent: 45 minutes with more than 50% on [place], MD [facility] Pager [phone]  If 7PM-7AM, please  contact night-coverage [email or web address] Password [facility] 05/12/2019, 3:27 PM Patient ID: Julie-Ann Stockbridge, female   DOB: 12/16/1926, 86 y.o.   MRN: [id number]

## 2019-05-12 NOTE — Progress Notes (Signed)
Jonathon Bellows , MD [address], [place], [place], [place], [address] [address] [place], [place], [place], [address] Phone: [phone]  Fax: [phone]   Theresa Cain is being followed for Melena  Day 2 of follow up   Subjective: Having tarry black stools   Objective: Vital signs in last 24 hours: Vitals:   05/11/19 1639 05/11/19 1953 05/12/19 0448 05/12/19 0816  BP: (!) 136/40 (!) 129/48 (!) 125/59 (!) 131/53  Pulse: 64 61 60 62  Resp: 18 19 19 19   Temp: 97.9 F (36.6 C) 97.8 F (36.6 C) 98 F (36.7 C) (!) 97.5 F (36.4 C)  TempSrc: Oral Oral Oral Oral  SpO2: 96% 98% 98% 100%  Weight:      Height:       Weight change:   Intake/Output Summary (Last 24 hours) at 05/12/2019 [id number] Last data filed at 05/12/2019 [id number] Gross per 24 hour  Intake 1462.11 ml  Output 2100 ml  Net -637.89 ml     Exam: Heart:: Regular rate and rhythm, S1S2 present or without murmur or extra heart sounds Lungs: normal, clear to auscultation and clear to auscultation and percussion Abdomen: soft, nontender, normal bowel sounds   Lab Results: @LABTEST2 @ Micro Results: Recent Results (from the past 240 hour(s))  SARS CORONAVIRUS 2 (TAT 6-24 HRS) Nasopharyngeal Nasopharyngeal Swab     Status: None   Collection Time: 05/10/19  2:36 PM   Specimen: Nasopharyngeal Swab  Result Value Ref Range Status   SARS Coronavirus 2 NEGATIVE NEGATIVE Final    Comment: (NOTE) SARS-CoV-2 target nucleic acids are NOT DETECTED. The SARS-CoV-2 RNA is generally detectable in upper and lower respiratory specimens during the acute phase of infection. Negative results do not preclude SARS-CoV-2 infection, do not rule out co-infections with other pathogens, and should not be used as the sole basis for treatment or other patient management decisions. Negative results must be combined with clinical observations, patient history, and epidemiological information. The expected result is Negative. Fact Sheet  for Patients: SugarRoll.be Fact Sheet for Healthcare Providers: https://www.woods-mathews.com/ This test is not yet approved or cleared by the [place] FDA and  has been authorized for detection and/or diagnosis of SARS-CoV-2 by FDA under an Emergency Use Authorization (EUA). This EUA will remain  in effect (meaning this test can be used) for the duration of the COVID-19 declaration under Section 56 4(b)(1) of the Act, 21 U.S.C. section 360bbb-3(b)(1), unless the authorization is terminated or revoked sooner. Performed at [facility], [place] [address], [place], [place] [address]    Studies/Results: No results found. Medications: I have reviewed the patient's current medications. Scheduled Meds: . Chlorhexidine Gluconate Cloth  6 each Topical Daily  . diltiazem  180 mg Oral Daily  . escitalopram  10 mg Oral QHS  . ferrous sulfate  325 mg Oral Q breakfast  . levothyroxine  50 mcg Oral Daily  . [START ON 05/14/2019] pantoprazole  40 mg Intravenous Q12H  . pravastatin  40 mg Oral q1800  . sodium chloride flush  10-40 mL Intracatheter Q12H  . torsemide  20 mg Oral Daily   Continuous Infusions: . pantoprozole (PROTONIX) infusion 8 mg/hr (05/12/19 0833)   PRN Meds:.acetaminophen, hydrALAZINE, ondansetron (ZOFRAN) IV, sodium chloride flush, sodium chloride flush   Assessment: Principal Problem:   GIB (gastrointestinal bleeding) Active Problems:   Chronic diastolic CHF (congestive heart failure) (HCC)   HTN (hypertension)   Permanent atrial fibrillation (HCC)   GERD (gastroesophageal reflux disease)  Coronary artery disease involving native coronary artery of native heart   Hypothyroidism   HLD (hyperlipidemia)   Anemia due to blood loss   CKD (chronic kidney disease), stage IIIa   Theresa Cain is a 86 y.o. y/o female whom I have been consulted for a GI bleed hemoglobin is lower than baseline at 6.6 g.  No rise in  BUN/creatinine ratio. 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 Hb stable   Plan  1. EGD on Friday - 2 days off eloquis 2. Monitor CBC and transfuse as needed 3. IV PPI   I have discussed alternative options, risks & benefits,  which include, but are not limited to, bleeding, infection, perforation,respiratory complication & drug reaction.  The patient agrees with this plan & written consent will be obtained.     LOS: 2 days   Jonathon Bellows, MD 05/12/2019, 9:07 AM

## 2019-05-12 NOTE — Plan of Care (Signed)
  Problem: Health Behavior/Discharge Planning: Goal: Ability to manage health-related needs will improve Outcome: Progressing   

## 2019-05-12 NOTE — Progress Notes (Signed)
Patient has a Mid-line (not a PICC).   Unable to draw labs from this site.   Patient is due for q6hr CBC per MD notes.      Called MD-  Per Dr. Kurtis Bushman- ok to switch to BID H&H.   Placed order to start now.  Will need lab to draw from peripheral sites.

## 2019-05-13 ENCOUNTER — Inpatient Hospital Stay: Payer: Medicare Other | Admitting: Anesthesiology

## 2019-05-13 ENCOUNTER — Encounter: Payer: Self-pay | Admitting: Internal Medicine

## 2019-05-13 ENCOUNTER — Encounter
Admission: EM | Disposition: A | Discharge status: Home / Self Care | Payer: Self-pay | Source: Home / Self Care | Attending: Internal Medicine

## 2019-05-13 HISTORY — PX: ESOPHAGOGASTRODUODENOSCOPY (EGD) WITH PROPOFOL: SHX5813

## 2019-05-13 LAB — HEMOGLOBIN AND HEMATOCRIT, BLOOD
HCT: 24.2 % — ABNORMAL LOW (ref 36.0–46.0)
HCT: 24.4 % — ABNORMAL LOW (ref 36.0–46.0)
Hemoglobin: 7.5 g/dL — ABNORMAL LOW (ref 12.0–15.0)
Hemoglobin: 7.4 g/dL — ABNORMAL LOW (ref 12.0–15.0)

## 2019-05-13 LAB — GLUCOSE, CAPILLARY: Glucose-Capillary: 135 mg/dL — ABNORMAL HIGH (ref 70–99)

## 2019-05-13 SURGERY — ESOPHAGOGASTRODUODENOSCOPY (EGD) WITH PROPOFOL
Anesthesia: General

## 2019-05-13 MED ORDER — PROPOFOL 500 MG/50ML IV EMUL
INTRAVENOUS | Status: AC
  Filled 2019-05-13: qty 50

## 2019-05-13 MED ORDER — LIDOCAINE HCL (CARDIAC) PF 100 MG/5ML IV SOSY
PREFILLED_SYRINGE | INTRAVENOUS | Status: DC | PRN
  Administered 2019-05-13: 60 mg via INTRAVENOUS

## 2019-05-13 MED ORDER — PROPOFOL 10 MG/ML IV BOLUS
INTRAVENOUS | Status: DC | PRN
  Administered 2019-05-13: 10 mg via INTRAVENOUS
  Administered 2019-05-13 (×2): 20 mg via INTRAVENOUS

## 2019-05-13 MED ORDER — PROPOFOL 500 MG/50ML IV EMUL
INTRAVENOUS | Status: DC | PRN
  Administered 2019-05-13: 50 ug/kg/min via INTRAVENOUS

## 2019-05-13 NOTE — Consult Note (Signed)
Hematology/Oncology Consult note  [facility] [phone] [phone]  Patient Care Team: Kendrick Ranch, MD as PCP - General (Internal Medicine) Constance Haw, MD as PCP - Electrophysiology (Cardiology) Sherren Mocha, MD as PCP - Cardiology (Cardiology)   Name of the patient: Theresa Cain  [id number]  1927-02-11    Reason for referral- gastric mass   Referring physician- Dr. Vicente Males Dr. Kurtis Bushman  Date of visit: 05/13/2019    History of presenting illness-  patient is a 86 year old female who presented to the ER with symptoms of fatigue and dark tarry stools.  She was noted to have a hemoglobin of 6.6/21.6.  Normally her hemoglobin runs between 11-12.  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 No evidence of recurrence since.  This was operated upon at [facility] and she saw Dr. Arrie Aran who was her surgical oncologist.  Patient has had a prior CT angio chest abdomen and pelvis in January 2019 which did not show any evidence of malignancy at that time.  There were morphological changes in liver suggestive of early cirrhosis.  There was concern for upper GI bleed given dark tarry stools during this admission and patient was seen by GI and underwent upper endoscopy today.  EGD showed a large polypoid partially circumferential involving half of the lumen circumference mass with oozing bleeding and stigmata of recent bleeding in the cardia.  Biopsies were taken for histology and hemostatic spray was applied.  Whipple's gastrojejunostomy was healthy-appearing.  Based on location and appearance this was concerning for neuroendocrine tumor.   ECOG PS- 1  Pain scale- 0   Review of systems- Review of Systems  Constitutional: Positive for malaise/fatigue. Negative for chills, fever and weight loss.  HENT: Negative for congestion, ear discharge and nosebleeds.   Eyes:  Negative for blurred vision.  Respiratory: Negative for cough, hemoptysis, sputum production, shortness of breath and wheezing.   Cardiovascular: Negative for chest pain, palpitations, orthopnea and claudication.  Gastrointestinal: Negative for abdominal pain, blood in stool, constipation, diarrhea, heartburn, melena, nausea and vomiting.  Genitourinary: Negative for dysuria, flank pain, frequency, hematuria and urgency.  Musculoskeletal: Negative for back pain, joint pain and myalgias.  Skin: Negative for rash.  Neurological: Negative for dizziness, tingling, focal weakness, seizures, weakness and headaches.  Endo/Heme/Allergies: Does not bruise/bleed easily.  Psychiatric/Behavioral: Negative for depression and suicidal ideas. The patient does not have insomnia.     Allergies  Allergen Reactions  . Penicillins Hives and Other (See Comments)    PATIENT HAS HAD A PCN REACTION WITH IMMEDIATE RASH, FACIAL/TONGUE/THROAT SWELLING, SOB, OR LIGHTHEADEDNESS WITH HYPOTENSION:  #  #  #  YES  #  #  #   Has patient had a PCN reaction causing severe rash involving mucus membranes or skin necrosis: No Has patient had a PCN reaction that required hospitalization: No Has patient had a PCN reaction occurring within the last 10 years: No If all of the above answers are "NO", then may proceed with Cephalosporin use.   . Sulfa Antibiotics Hives  . Cymbalta [Duloxetine Hcl]     Sweat, feels like having a heart attack     Patient Active Problem List   Diagnosis Date Noted  . CKD (chronic kidney disease), stage IIIa 05/10/2019  . Hypothyroidism   . HLD (hyperlipidemia)   . Anemia due to blood loss   . Acute GI bleeding   . 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 Chronic kidney disease   . Severe aortic stenosis   . Coronary artery disease involving native coronary artery of native heart   . Chronic diastolic CHF (congestive heart failure) ([place]) 03/23/2017  . HTN (hypertension)  03/23/2017  . Permanent atrial fibrillation ([place]) 03/23/2017  . GERD (gastroesophageal reflux disease) 03/23/2017     Past Medical History:  Diagnosis Date  . Acute on chronic diastolic CHF (congestive heart failure) ([place]) 02/22/2016  . Anemia   . Bradycardia   . Carotid artery occlusion   . Chronic diastolic CHF (congestive heart failure) ([place])   . Chronic kidney disease    elevated creatinne  . Coronary artery disease involving native coronary artery of native heart   . GERD (gastroesophageal reflux disease)   . HLD (hyperlipidemia)   . Hypertension   . Hypothyroidism   . Obesity   . Osteoarthritis   . Permanent atrial fibrillation ([place])   . Presence of permanent cardiac pacemaker   . PVD (peripheral vascular disease) ([place])   . 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 Severe aortic stenosis   . Stomach cancer ([place]) ~ 2013  . Vertigo      Past Surgical History:  Procedure Laterality Date  . CARDIOVASCULAR STRESS TEST    . CARDIOVERSION N/A 08/08/2016   Procedure: CARDIOVERSION;  Surgeon: Sueanne Margarita, MD;  Location: [facility] ENDOSCOPY;  Service: Cardiovascular;  Laterality: N/A;  . CAROTID ENDARTERECTOMY Right ~ 2005  . CATARACT EXTRACTION W/ INTRAOCULAR LENS IMPLANT Right   . CHOLECYSTECTOMY  ~ 2013   "part of her [facility] OR"  . EP IMPLANTABLE DEVICE N/A 03/04/2016   Procedure: Pacemaker Implant;  Surgeon: Will Meredith Leeds, MD;  Location: [place] CV LAB;  Service: Cardiovascular;  Laterality: N/A;  . EP IMPLANTABLE DEVICE N/A 03/05/2016   Procedure: Lead Revision/Repair;  Surgeon: Deboraha Sprang, MD;  Location: [place] CV LAB;  Service: Cardiovascular;  Laterality: N/A;  . INSERT / REPLACE / REMOVE PACEMAKER    . LEFT HEART CATH AND CORONARY ANGIOGRAPHY N/A 03/27/2017   Procedure: LEFT HEART CATH AND CORONARY ANGIOGRAPHY;  Surgeon: Minna Merritts, MD;  Location: [place] CV LAB;  Service: Cardiovascular;  Laterality: N/A;  . TEE WITHOUT CARDIOVERSION N/A 05/12/2017   Procedure: TRANSESOPHAGEAL ECHOCARDIOGRAM (TEE);  Surgeon: Sherren Mocha, MD;  Location: [place];  Service: Open Heart Surgery;  Laterality: N/A;  . TRANSCATHETER AORTIC VALVE REPLACEMENT, TRANSFEMORAL N/A 05/12/2017   Procedure: TRANSCATHETER AORTIC VALVE REPLACEMENT, TRANSFEMORAL using a 52mm Edwards Sapien 3 Aortic Valve;  Surgeon: Sherren Mocha, MD;  Location: [place];  Service: Open Heart Surgery;  Laterality: N/A;  . US ECHOCARDIOGRAPHY    . VAGINAL HYSTERECTOMY    . WHIPPLE PROCEDURE  ~ 2013    Social History   Socioeconomic History  . Marital status: Widowed    Spouse name: Not on file  . Number of children: Not on file  . Years of education: Not on file  . Highest education level: Not on file  Occupational History  . Not on file  Tobacco Use  . Smoking status: Never Smoker  . Smokeless tobacco: Never Used  Substance and Sexual Activity  . Alcohol use: No  . Drug use: No  . Sexual activity: Never  Other Topics Concern  . Not on file  Social History Narrative  . Not on file  Social Determinants of Health   Financial Resource Strain:   . Difficulty of Paying Living Expenses: Not on file  Food Insecurity:   . Worried About Charity fundraiser in the Last Year: Not on file  . Ran Out of Food in the Last Year: Not on file  Transportation Needs:   . Lack of Transportation (Medical): Not on file  . Lack of Transportation (Non-Medical): Not on file  Physical Activity:   . Days of Exercise per Week: Not on file  . Minutes of Exercise per Session: Not on file  Stress:   . Feeling of Stress : Not on file  Social Connections:   . Frequency of Communication with Friends and Family: Not on file  . Frequency of Social Gatherings with Friends and Family: Not on file  . Attends Religious Services: Not on file  . Active Member of Clubs or  Organizations: Not on file  . Attends Archivist Meetings: Not on file  . Marital Status: Not on file  Intimate Partner Violence:   . Fear of Current or Ex-Partner: Not on file  . Emotionally Abused: Not on file  . Physically Abused: Not on file  . Sexually Abused: Not on file     Family History  Problem Relation Age of Onset  . Heart disease Sister      Current Facility-Administered Medications:  .  acetaminophen (TYLENOL) tablet 650 mg, 650 mg, Oral, Q6H PRN, Ivor Costa, MD .  Chlorhexidine Gluconate Cloth 2 % PADS 6 each, 6 each, Topical, Daily, Ivor Costa, MD, 6 each at 05/12/19 1730 .  diltiazem (CARDIZEM CD) 24 hr capsule 180 mg, 180 mg, Oral, Daily, Ivor Costa, MD, 180 mg at 05/13/19 0806 .  escitalopram (LEXAPRO) tablet 10 mg, 10 mg, Oral, QHS, Ivor Costa, MD, 10 mg at 05/12/19 2036 .  ferrous sulfate tablet 325 mg, 325 mg, Oral, Q breakfast, Ivor Costa, MD, 325 mg at 05/13/19 0806 .  hydrALAZINE (APRESOLINE) tablet 25 mg, 25 mg, Oral, TID PRN, Ivor Costa, MD .  levothyroxine (SYNTHROID) tablet 50 mcg, 50 mcg, Oral, Daily, Ivor Costa, MD, 50 mcg at 05/13/19 0807 .  ondansetron (ZOFRAN) injection 4 mg, 4 mg, Intravenous, Q8H PRN, Ivor Costa, MD .  Derrill Memo ON 05/14/2019] pantoprazole (PROTONIX) injection 40 mg, 40 mg, Intravenous, Q12H, Ivor Costa, MD .  pravastatin (PRAVACHOL) tablet 40 mg, 40 mg, Oral, q1800, Ivor Costa, MD, 40 mg at 05/12/19 1730 .  sodium chloride flush (NS) 0.9 % injection 10-40 mL, 10-40 mL, Intracatheter, PRN, Delman Kitten, MD .  sodium chloride flush (NS) 0.9 % injection 10-40 mL, 10-40 mL, Intracatheter, Q12H, Ivor Costa, MD, 10 mL at 05/13/19 0807 .  sodium chloride flush (NS) 0.9 % injection 10-40 mL, 10-40 mL, Intracatheter, PRN, Ivor Costa, MD .  torsemide [facility]) tablet 20 mg, 20 mg, Oral, Daily, Ivor Costa, MD, 20 mg at 05/13/19 0806   Physical exam:  Vitals:   05/13/19 1114 05/13/19 1121 05/13/19 1215 05/13/19 1225  BP: (!) 135/50   92/75 112/88  Pulse: 68  62 60  Resp: 20  20 18   Temp: 98.5 F (36.9 C)  (!) 97.3 F (36.3 C)   TempSrc: Temporal  Temporal   SpO2: 100%  100% 100%  Weight:  198 lb 13.7 oz (90.2 kg)    Height:       Physical Exam HENT:     Head: Normocephalic and atraumatic.  Eyes:     Pupils: Pupils are equal,  round, and reactive to light.  Cardiovascular:     Rate and Rhythm: Normal rate and regular rhythm.     Heart sounds: Normal heart sounds.  Pulmonary:     Effort: Pulmonary effort is normal.     Breath sounds: Normal breath sounds.  Abdominal:     General: Bowel sounds are normal.     Palpations: Abdomen is soft.  Musculoskeletal:     Cervical back: Normal range of motion.  Skin:    General: Skin is warm and dry.  Neurological:     Mental Status: She is alert and oriented to person, place, and time.        CMP Latest Ref Rng & Units 05/12/2019  Glucose 70 - 99 mg/dL 135(H)  BUN 8 - 23 mg/dL 19  Creatinine 0.44 - 1.00 mg/dL 1.31(H)  Sodium 135 - 145 mmol/L 138  Potassium 3.5 - 5.1 mmol/L 3.7  Chloride 98 - 111 mmol/L 105  CO2 22 - 32 mmol/L 25  Calcium 8.9 - 10.3 mg/dL 8.0(L)  Total Protein 6.5 - 8.1 g/dL -  Total Bilirubin 0.3 - 1.2 mg/dL -  Alkaline Phos 38 - 126 U/L -  AST 15 - 41 U/L -  ALT 0 - 44 U/L -   CBC Latest Ref Rng & Units 05/13/2019  WBC 4.0 - 10.5 K/uL -  Hemoglobin 12.0 - 15.0 g/dL 7.5(L)  Hematocrit 36.0 - 46.0 % 24.2(L)  Platelets 150 - 400 K/uL -    @IMAGES @  No results found.  Assessment and plan- Patient is a 86 y.o. female with prior history of neuroendocrine tumor of the small bowel in 2014 T2 N0 M0 s/p pancreaticoduodenectomy at [facility] now presenting with upper GI bleed secondary to gastric mass clinically concerning for neuroendocrine tumor  At this time I would await final pathology results and if this confirms neuroendocrine tumor she would need outpatient dotatate PET scan to assess if this is the only site of disease or if there is any  evidence of metastatic disease.  Ideally surgical resection would be recommended for neuroendocrine tumor and once pathology comes back I will reach out to her surgical oncologist Dr. Doyle Askew at [facility] for the same.  Acute iron deficiency anemia secondary to upper GI bleed from bleeding malignancy: S/p hemostatic spray which would be a temporizing measure.  Recommend giving her 1 dose of Feraheme prior to discharge and I will arrange for further IV iron as an outpatient.  I would prefer that patient gets definitive surgical intervention for this if she is a candidate versus palliative radiation at this time and I will therefore need input from surgical oncology before considering non surgical options.  If she rebleeds as an inpatient over the weekend- rad Onc is not available and such she cannot start emergent RT over the weekend. GI versus vascular surgery are potential options. Could also get 1 dose of octreotide pending pathology   Thank you for this kind referral and the opportunity to participate in the care of this patient   Visit Diagnosis 1. Acute GI bleeding  2. Gastric mass Dr. Randa Evens, MD, MPH [facility] [id number] 05/13/2019 4:00 PM

## 2019-05-13 NOTE — H&P (Signed)
Jonathon Bellows, MD [address] [place], [place], [place], [address] [address], [place], [place], [place], [address] Phone: [phone]  Fax: [phone]  Primary Care Physician:  Kendrick Ranch, MD   Pre-Procedure History & Physical: HPI:  Theresa Cain is a 86 y.o. female is here for an endoscopy    Past Medical History:  Diagnosis Date  . Acute on chronic diastolic CHF (congestive heart failure) ([place]) 02/22/2016  . Anemia   . Bradycardia   . Carotid artery occlusion   . Chronic diastolic CHF (congestive heart failure) ([place])   . Chronic kidney disease    elevated creatinne  . Coronary artery disease involving native coronary artery of native heart   . GERD (gastroesophageal reflux disease)   . HLD (hyperlipidemia)   . Hypertension   . Hypothyroidism   . Obesity   . Osteoarthritis   . Permanent atrial fibrillation ([place])   . Presence of permanent cardiac pacemaker   . PVD (peripheral vascular disease) ([place])   . 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 Severe aortic stenosis   . Stomach cancer ([place]) ~ 2013  . Vertigo     Past Surgical History:  Procedure Laterality Date  . CARDIOVASCULAR STRESS TEST    . CARDIOVERSION N/A 08/08/2016   Procedure: CARDIOVERSION;  Surgeon: Sueanne Margarita, MD;  Location: [facility] ENDOSCOPY;  Service: Cardiovascular;  Laterality: N/A;  . CAROTID ENDARTERECTOMY Right ~ 2005  . CATARACT EXTRACTION W/ INTRAOCULAR LENS IMPLANT Right   . CHOLECYSTECTOMY  ~ 2013   "part of her [facility] OR"  . EP IMPLANTABLE DEVICE N/A 03/04/2016   Procedure: Pacemaker Implant;  Surgeon: Will Meredith Leeds, MD;  Location: [place] CV LAB;  Service: Cardiovascular;  Laterality: N/A;  . EP IMPLANTABLE DEVICE N/A 03/05/2016   Procedure: Lead Revision/Repair;  Surgeon: Deboraha Sprang, MD;  Location: [place] CV LAB;   Service: Cardiovascular;  Laterality: N/A;  . INSERT / REPLACE / REMOVE PACEMAKER    . LEFT HEART CATH AND CORONARY ANGIOGRAPHY N/A 03/27/2017   Procedure: LEFT HEART CATH AND CORONARY ANGIOGRAPHY;  Surgeon: Minna Merritts, MD;  Location: [place] CV LAB;  Service: Cardiovascular;  Laterality: N/A;  . TEE WITHOUT CARDIOVERSION N/A 05/12/2017   Procedure: TRANSESOPHAGEAL ECHOCARDIOGRAM (TEE);  Surgeon: Sherren Mocha, MD;  Location: [place];  Service: Open Heart Surgery;  Laterality: N/A;  . TRANSCATHETER AORTIC VALVE REPLACEMENT, TRANSFEMORAL N/A 05/12/2017   Procedure: TRANSCATHETER AORTIC VALVE REPLACEMENT, TRANSFEMORAL using a 67mm Edwards Sapien 3 Aortic Valve;  Surgeon: Sherren Mocha, MD;  Location: [place];  Service: Open Heart Surgery;  Laterality: N/A;  . US ECHOCARDIOGRAPHY    . VAGINAL HYSTERECTOMY    . WHIPPLE PROCEDURE  ~ 2013    Prior to Admission medications   Medication Sig Start Date End Date Taking? Authorizing Provider  acetaminophen (TYLENOL) 500 MG tablet Take 1,000 mg by mouth every 6 (six) hours as needed for moderate pain.   Yes [provider]  apixaban (ELIQUIS) 5 MG TABS tablet Take 1 tablet (5 mg total) by mouth 2 (two) times daily. Patient taking differently: Take 2.5 mg by mouth 2 (two) times daily.  01/22/18  Yes Minna Merritts, MD  diltiazem (CARDIZEM CD) 180 MG 24 hr capsule Take 1 capsule (180 mg total) by mouth daily. 03/27/17  Yes Vaughan Basta, MD  escitalopram [place])  10 MG tablet Take 10 mg by mouth at bedtime.    Yes [provider]  ferrous sulfate 325 (65 FE) MG tablet Take 325 mg by mouth daily with breakfast.   Yes [provider]  levothyroxine (SYNTHROID) 50 MCG tablet Take 1 tablet (50 mcg total) by mouth daily. 03/27/17 05/10/19 Yes Vaughan Basta, MD  omeprazole (PRILOSEC) 20 MG capsule Take 20 mg by mouth daily.   Yes [provider]  potassium chloride (K-DUR) 10 MEQ tablet Take 1 tablet  (10 mEq total) by mouth 2 (two) times daily. 03/27/17  Yes Vaughan Basta, MD  pravastatin (PRAVACHOL) 40 MG tablet Take 40 mg by mouth every evening.    Yes [provider]  torsemide (DEMADEX) 20 MG tablet Take 1 tablet (20 mg total) by mouth daily. Take one additional tablet daily as needed for swelling. 04/01/17  Yes Eileen Stanford, PA-C  Vitamin D, Ergocalciferol, (DRISDOL) 50000 units CAPS capsule Take 50,000 Units by mouth 2 (two) times a week. Take on Sunday and Thursday   Yes [provider]    Allergies as of 05/10/2019 - Review Complete 05/10/2019  Allergen Reaction Noted  . Penicillins Hives and Other (See Comments) 02/05/2016  . Sulfa antibiotics Hives 02/05/2016  . Cymbalta [duloxetine hcl]  05/10/2019    Family History  Problem Relation Age of Onset  . Heart disease Sister     Social History   Socioeconomic History  . Marital status: Widowed    Spouse name: Not on file  . Number of children: Not on file  . Years of education: Not on file  . Highest education level: Not on file  Occupational History  . Not on file  Tobacco Use  . Smoking status: Never Smoker  . Smokeless tobacco: Never Used  Substance and Sexual Activity  . Alcohol use: No  . Drug use: No  . Sexual activity: Never  Other Topics Concern  . Not on file  Social History Narrative  . Not on file   Social Determinants of Health   Financial Resource Strain:   . Difficulty of Paying Living Expenses: Not on file  Food Insecurity:   . Worried About Charity fundraiser in the Last Year: Not on file  . Ran Out of Food in the Last Year: Not on file  Transportation Needs:   . Lack of Transportation (Medical): Not on file  . Lack of Transportation (Non-Medical): Not on file  Physical Activity:   . Days of Exercise per Week: Not on file  . Minutes of Exercise per Session: Not on file  Stress:   . Feeling of Stress : Not on file  Social Connections:   . Frequency of  Communication with Friends and Family: Not on file  . Frequency of Social Gatherings with Friends and Family: Not on file  . Attends Religious Services: Not on file  . Active Member of Clubs or Organizations: Not on file  . Attends Archivist Meetings: Not on file  . Marital Status: Not on file  Intimate Partner Violence:   . Fear of Current or Ex-Partner: Not on file  . Emotionally Abused: Not on file  . Physically Abused: Not on file  . Sexually Abused: Not on file    Review of Systems: See HPI, otherwise negative ROS  Physical Exam: BP (!) 135/50   Pulse 68   Temp 98.5 F (36.9 C) (Temporal)   Resp 20   Ht 6' (1.829 m)  Wt 90.2 kg   SpO2 100%   BMI 26.97 kg/m  General:   Alert,  pleasant and cooperative in NAD Head:  Normocephalic and atraumatic. Neck:  Supple; no masses or thyromegaly. Lungs:  Clear throughout to auscultation, normal respiratory effort.    Heart:  +S1, +S2, Regular rate and rhythm, No edema. Abdomen:  Soft, nontender and nondistended. Normal bowel sounds, without guarding, and without rebound.   Neurologic:  Alert and  oriented x4;  grossly normal neurologically.  Impression/Plan: Katerin Repinski is here for an endoscopy  to be performed for  evaluation of melena    Risks, benefits, limitations, and alternatives regarding endoscopy have been reviewed with the patient.  Questions have been answered.  All parties agreeable.   Jonathon Bellows, MD  05/13/2019, 11:45 AM

## 2019-05-13 NOTE — Progress Notes (Signed)
Spoke to GI MD-  Ok to give morning medications with small amount of water.

## 2019-05-13 NOTE — Op Note (Signed)
[facility] Gastroenterology Patient Name: Theresa Cain Procedure Date: 05/13/2019 11:50 AM MRN: [id number] Account #: [id number] Date of Birth: 01/12/1927 Admit Type: Inpatient Age: 86 Room: [facility] ENDO ROOM 3 Gender: Female Note Status: Finalized Procedure:             Upper GI endoscopy Indications:           Melena Providers:             Jonathon Bellows MD, MD Referring MD:          Bettina Gavia. Vasireddy, MD (Referring MD) Medicines:             Monitored Anesthesia Care Complications:         No immediate complications. Procedure:             Pre-Anesthesia Assessment:                        - Prior to the procedure, a History and Physical was                         performed, and patient medications, allergies and                         sensitivities were reviewed. The patient's tolerance                         of previous anesthesia was reviewed.                        - The risks and benefits of the procedure and the                         sedation options and risks were discussed with the                         patient. All questions were answered and informed                         consent was obtained.                        - ASA Grade Assessment: III - A patient with severe                         systemic disease.                        After obtaining informed consent, the endoscope was                         passed under direct vision. Throughout the procedure,                         the patient's blood pressure, pulse, and oxygen                         saturations were monitored continuously. The Endoscope                         was introduced through the mouth, and advanced  to the                         jejunum. The upper GI endoscopy was accomplished with                         ease. The patient tolerated the procedure well. Findings:      The esophagus was normal.      A large, polypoid, partially circumferential (involving one-half of  the       lumen circumference) mass with oozing bleeding and stigmata of recent       bleeding was found in the cardia. Biopsies were taken with a cold       forceps for histology. To stop active bleeding, hemostatic spray was       deployed. Several sprays were applied. There was no bleeding at the end       of the procedure.      Whipples gastro jejunostormy- hyealthy appearance, no abnormalities,       lumen patent Impression:            - Normal esophagus.                        - Rule out malignancy, gastric tumor in the cardia.                         Biopsied. hemostatic spray applied.                        - Gastric bypass [Pouch Size] and [Staple Line].                         Gastrojejunal anastomosis characterized by stenosis. Recommendation:        - Return patient to hospital ward for ongoing care.                        - NPO today.                        - 1. F/u in biopsies                        2. Oncology consult                        3. 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                         based on loation and appearance Procedure Code(s):     --- Professional ---                        [id number], 59, Esophagogastroduodenoscopy, flexible,                         transoral; with control of bleeding, any method                        43239, Esophagogastroduodenoscopy, flexible,                         transoral; with  biopsy, single or multiple Diagnosis Code(s):     --- Professional ---                        D49.0, Neoplasm of unspecified behavior of digestive                         system                        123456, Other complications of other bariatric                         procedure                        K92.1, Melena (includes Hematochezia) CPT copyright 2019 American Medical Association. All rights reserved. The codes documented in this report are preliminary and upon coder review may  be revised to meet current compliance  requirements. Jonathon Bellows, MD Jonathon Bellows MD, MD 05/13/2019 12:15:09 PM This report has been signed electronically. Number of Addenda: 0 Note Initiated On: 05/13/2019 11:50 AM Estimated Blood Loss:  Estimated blood loss: none.      [facility]

## 2019-05-13 NOTE — Anesthesia Preprocedure Evaluation (Signed)
Anesthesia Evaluation  Patient identified by MRN, date of birth, ID band Patient awake    Reviewed: Allergy & Precautions, NPO status , Patient's Chart, lab work & pertinent test results  History of Anesthesia Complications Negative for: history of anesthetic complications  Airway Mallampati: II  TM Distance: >3 FB Neck ROM: Full    Dental  (+) Poor Dentition, Missing   Pulmonary neg pulmonary ROS, neg sleep apnea, neg COPD,    breath sounds clear to auscultation- rhonchi (-) wheezing      Cardiovascular hypertension, Pt. on medications + CAD, + Peripheral Vascular Disease and +CHF  (-) Cardiac Stents and (-) CABG + pacemaker + Valvular Problems/Murmurs (s/p TAVR)  Rhythm:Regular Rate:Normal - Systolic murmurs and - Diastolic murmurs    Neuro/Psych neg Seizures negative neurological ROS  negative psych ROS   GI/Hepatic Neg liver ROS, GERD  ,  Endo/Other  neg diabetesHypothyroidism   Renal/GU Renal InsufficiencyRenal disease     Musculoskeletal  (+) Arthritis ,   Abdominal (+) - obese,   Peds  Hematology  (+) anemia ,   Anesthesia Other Findings Past Medical History: 02/22/2016: Acute on chronic diastolic CHF (congestive heart failure)  (HCC) No date: Anemia No date: Bradycardia No date: Carotid artery occlusion No date: Chronic diastolic CHF (congestive heart failure) (HCC) No date: Chronic kidney disease     Comment:  elevated creatinne No date: Coronary artery disease involving native coronary artery of  native heart No date: GERD (gastroesophageal reflux disease) No date: HLD (hyperlipidemia) No date: Hypertension No date: Hypothyroidism No date: Obesity No date: Osteoarthritis No date: Permanent atrial fibrillation (HCC) No date: Presence of permanent cardiac pacemaker No date: PVD (peripheral vascular disease) ([place]) 05/12/2017: S/P TAVR (transcatheter aortic valve replacement)     Comment:  23 mm  Edwards Sapien 3 transcatheter heart valve placed               via percutaneous right transfemoral approach  No date: Severe aortic stenosis ~ 2013: Stomach cancer (HCC) No date: Vertigo   Reproductive/Obstetrics                             Anesthesia Physical Anesthesia Plan  ASA: III  Anesthesia Plan: General   Post-op Pain Management:    Induction: Intravenous  PONV Risk Score and Plan: 2 and Propofol infusion  Airway Management Planned: Natural Airway  Additional Equipment:   Intra-op Plan:   Post-operative Plan:   Informed Consent: I have reviewed the patients History and Physical, chart, labs and discussed the procedure including the risks, benefits and alternatives for the proposed anesthesia with the patient or authorized representative who has indicated his/her understanding and acceptance.     Dental advisory given  Plan Discussed with: CRNA and Anesthesiologist  Anesthesia Plan Comments:         Anesthesia Quick Evaluation

## 2019-05-13 NOTE — Transfer of Care (Signed)
Immediate Anesthesia Transfer of Care Note  Patient: Theresa Cain  Procedure(s) Performed: ESOPHAGOGASTRODUODENOSCOPY (EGD) WITH PROPOFOL (N/A )  Patient Location: PACU  Anesthesia Type:General  Level of Consciousness: awake, alert  and oriented  Airway & Oxygen Therapy: Patient Spontanous Breathing and Patient connected to nasal cannula oxygen  Post-op Assessment: Report given to RN and Post -op Vital signs reviewed and stable  Post vital signs: Reviewed and stable  Last Vitals:  Vitals Value Taken Time  BP 92/75 05/13/19 1215  Temp 36.3 C 05/13/19 1215  Pulse 62 05/13/19 1215  Resp 20 05/13/19 1215  SpO2 100 % 05/13/19 1215    Last Pain:  Vitals:   05/13/19 1215  TempSrc: Temporal  PainSc: 0-No pain         Complications: No apparent anesthesia complications

## 2019-05-13 NOTE — Anesthesia Postprocedure Evaluation (Signed)
Anesthesia Post Note  Patient: Theresa Cain  Procedure(s) Performed: ESOPHAGOGASTRODUODENOSCOPY (EGD) WITH PROPOFOL (N/A )  Patient location during evaluation: Endoscopy Anesthesia Type: General Level of consciousness: awake and alert and oriented Pain management: pain level controlled Vital Signs Assessment: post-procedure vital signs reviewed and stable Respiratory status: spontaneous breathing, nonlabored ventilation and respiratory function stable Cardiovascular status: blood pressure returned to baseline and stable Postop Assessment: no signs of nausea or vomiting Anesthetic complications: no     Last Vitals:  Vitals:   05/13/19 1215 05/13/19 1225  BP: 92/75 112/88  Pulse: 62 60  Resp: 20 18  Temp: (!) 36.3 C   SpO2: 100% 100%    Last Pain:  Vitals:   05/13/19 1215  TempSrc: Temporal  PainSc: 0-No pain                 Felice Deem

## 2019-05-13 NOTE — Progress Notes (Signed)
PROGRESS NOTE    Theresa Cain  [id number] DOB: 1926/06/09 DOA: 05/10/2019 PCP: Kendrick Ranch, MD    Brief Narrative:  Theresa Cain is a 86 y.o. female with medical history significant of hypertension, hyperlipidemia, GERD, hypothyroidism, depression, stomach cancer 2013, s/p of TAVR, PVD, atrial fibrillation on Eliquis, CKD stage III, dCHF, anemia, who presents with generalized weakness and black stool.pt was found to have hemoglobin dropped from 13.1 on 09/12/2017 to 6.6, 6.2, INR 1.4, lactic acid 1.4, renal function close to baseline, temperature normal, blood pressure 132/52, heart rate 64, RR 16, oxygen saturation 99% on room air.  Patient is admitted to telemetry bed as inpatient.  GI, Dr. Marylene Land was consulted.    Consultants:   GI  Procedures: None  Antimicrobials:   None   Subjective: Patient has no complaints.  Denies bloody bowel movement this AM.  Denies shortness of breath or chest pain. Objective: Vitals:   05/12/19 0816 05/12/19 1544 05/12/19 1917 05/13/19 0326  BP: (!) 131/53 (!) 130/49 (!) 115/57 (!) 121/48  Pulse: 62 60 60 63  Resp: 19 19 19 19   Temp: (!) 97.5 F (36.4 C) 98.6 F (37 C) 97.8 F (36.6 C) 98.2 F (36.8 C)  TempSrc: Oral  Oral Oral  SpO2: 100% 100% 97% 96%  Weight:      Height:        Intake/Output Summary (Last 24 hours) at 05/13/2019 0728 Last data filed at 05/12/2019 2300 Gross per 24 hour  Intake 1434.86 ml  Output 700 ml  Net 734.86 ml   Filed Weights   05/10/19 1144  Weight: 90.1 kg    Examination:  General exam: Appears calm and comfortable  Respiratory system: Clear to auscultation. Respiratory effort normal. Cardiovascular system: S1 & S2 heard, RRR. No murmurs, rubs, gallops or clicks.  Gastrointestinal system: Abdomen is nondistended, soft and nontender.  Normal bowel sounds heard.no guarding or rebound Central nervous system: Alert and oriented.  Cranial nerve grossly intact Extremities: No edema or  cyanosis Skin: Warm dry Psychiatry: Judgement and insight appear normal. Mood & affect appropriate.     Data Reviewed: I have personally reviewed following labs and imaging studies  CBC: Recent Labs  Lab 05/11/19 1240 05/11/19 1240 05/11/19 2138 05/12/19 0205 05/12/19 0540 05/12/19 0845 05/12/19 1628  WBC 7.6  --  7.2 6.4 5.6 6.1  --   HGB 7.9*   < > 7.6* 7.5* 7.2* 7.6* 7.6*  HCT 24.8*   < > 24.0* 23.9* 22.9* 24.6* 24.1*  MCV 90.2  --  90.2 91.6 91.2 90.4  --   PLT 145*  --  135* 133* 134* 145*  --    < > = values in this interval not displayed.   Basic Metabolic Panel: Recent Labs  Lab 05/10/19 1156 05/11/19 0200 05/12/19 0205  NA 140 141 138  K 4.1 3.9 3.7  CL 108 109 105  CO2 23 24 25   GLUCOSE 148* 150* 135*  BUN 22 19 19   CREATININE 1.34* 1.19* 1.31*  CALCIUM 8.8* 8.3* 8.0*   GFR: Estimated Creatinine Clearance: 36 mL/min (A) (by C-G formula based on SCr of 1.31 mg/dL (H)). Liver Function Tests: Recent Labs  Lab 05/10/19 1156  AST 17  ALT 10  ALKPHOS 53  BILITOT 0.5  PROT 6.4*  ALBUMIN 3.6   No results for input(s): LIPASE, AMYLASE in the last 168 hours. No results for input(s): AMMONIA in the last 168 hours. Coagulation Profile: Recent Labs  Lab 05/10/19 1156  INR 1.4*   Cardiac Enzymes: No results for input(s): CKTOTAL, CKMB, CKMBINDEX, TROPONINI in the last 168 hours. BNP (last 3 results) No results for input(s): PROBNP in the last 8760 hours. HbA1C: No results for input(s): HGBA1C in the last 72 hours. CBG: Recent Labs  Lab 05/11/19 0723 05/11/19 1155 05/11/19 1637 05/12/19 0818  GLUCAP 148* 127* 129* 125*   Lipid Profile: No results for input(s): CHOL, HDL, LDLCALC, TRIG, CHOLHDL, LDLDIRECT in the last 72 hours. Thyroid Function Tests: No results for input(s): TSH, T4TOTAL, FREET4, T3FREE, THYROIDAB in the last 72 hours. Anemia Panel: No results for input(s): VITAMINB12, FOLATE, FERRITIN, TIBC, IRON, RETICCTPCT in the last 72  hours. Sepsis Labs: No results for input(s): PROCALCITON, LATICACIDVEN in the last 168 hours.  Recent Results (from the past 240 hour(s))  SARS CORONAVIRUS 2 (TAT 6-24 HRS) Nasopharyngeal Nasopharyngeal Swab     Status: None   Collection Time: 05/10/19  2:36 PM   Specimen: Nasopharyngeal Swab  Result Value Ref Range Status   SARS Coronavirus 2 NEGATIVE NEGATIVE Final    Comment: (NOTE) SARS-CoV-2 target nucleic acids are NOT DETECTED. The SARS-CoV-2 RNA is generally detectable in upper and lower respiratory specimens during the acute phase of infection. Negative results do not preclude SARS-CoV-2 infection, do not rule out co-infections with other pathogens, and should not be used as the sole basis for treatment or other patient management decisions. Negative results must be combined with clinical observations, patient history, and epidemiological information. The expected result is Negative. Fact Sheet for Patients: SugarRoll.be Fact Sheet for Healthcare Providers: https://www.woods-mathews.com/ This test is not yet approved or cleared by the [place] FDA and  has been authorized for detection and/or diagnosis of SARS-CoV-2 by FDA under an Emergency Use Authorization (EUA). This EUA will remain  in effect (meaning this test can be used) for the duration of the COVID-19 declaration under Section 56 4(b)(1) of the Act, 21 U.S.C. section 360bbb-3(b)(1), unless the authorization is terminated or revoked sooner. Performed at [facility], [place] [address] [place], [place] [address]          Radiology Studies: No results found.      Scheduled Meds: . Chlorhexidine Gluconate Cloth  6 each Topical Daily  . diltiazem  180 mg Oral Daily  . escitalopram  10 mg Oral QHS  . ferrous sulfate  325 mg Oral Q breakfast  . levothyroxine  50 mcg Oral Daily  . [START ON 05/14/2019] pantoprazole  40 mg Intravenous Q12H  . pravastatin   40 mg Oral q1800  . sodium chloride flush  10-40 mL Intracatheter Q12H  . torsemide  20 mg Oral Daily   Continuous Infusions: . sodium chloride 50 mL/hr at 05/13/19 0603  . pantoprozole (PROTONIX) infusion 8 mg/hr (05/12/19 2100)    Assessment & Plan:   Principal Problem:   Acute GI bleeding Active Problems:   Chronic diastolic CHF (congestive heart failure) (HCC)   HTN (hypertension)   Permanent atrial fibrillation (HCC)   GERD (gastroesophageal reflux disease)   Coronary artery disease involving native coronary artery of native heart   Hypothyroidism   HLD (hyperlipidemia)   Anemia due to blood loss   CKD (chronic kidney disease), stage IIIa   GIB (gastrointestinal bleeding): Hgb 13.1 -->6.6.  Currently hemodynamically stable.  -GI following -Continue to hold Eliquis -Status post transfusion of  2 units of PRBC - Start IV pantoprazole gtt - Zofran IV for nausea - Avoid NSAIDs and SQ heparin - Monitor  closely and follow q6h cbc, transfuse as necessary, if Hgb<7.0 -Plan for EGD today   Chronic diastolic CHF (congestive heart failure) ([place]): 2D echo on 05/20/2018 showed EF> 65%.  Currently compensated. -Continue home torsemide   HTN:  -Continue home medications: Diltiazem, torsemide -hydralazine prn  Permanent atrial fibrillation ([place]): -Continue to hold Eliquis due to GI bleed -continue Cardizem  GERD (gastroesophageal reflux disease): -on protonix  Coronary artery disease involving native coronary artery of native heart: -Currently asymptomatic -continue pravastatin  Hypothyroidism -Continue Synthroid  HLD (hyperlipidemia) -Continue pravastatin  Anemia due to blood loss: -Blood transfusion as above -Continue iron supplement  CKD (chronic kidney disease), stage IIIa: Stable.  Baseline creatinine 1.0-1.3.   DVT prophylaxis: SCD Code Status: Full Family Communication: None at bedside Disposition Plan: Once EGD completed and cleared from GI  standpoint can DC       LOS: 3 days   Time spent: 45 minutes with more than 50% on [place], MD [facility] Pager [phone]  If 7PM-7AM, please contact night-coverage [email or web address] Password [facility] 05/13/2019, 7:28 AM Patient ID: Tanvi Issac, female   DOB: [birth year], 86 y.o.   MRN: [id number] Patient ID: Saniyah Keup, female   DOB: 20-Feb-1927, 86 y.o.   MRN: [id number]

## 2019-05-14 LAB — CBC
HCT: 23.6 % — ABNORMAL LOW (ref 36.0–46.0)
Hemoglobin: 7.3 g/dL — ABNORMAL LOW (ref 12.0–15.0)
MCH: 27.8 pg (ref 26.0–34.0)
MCHC: 30.9 g/dL (ref 30.0–36.0)
MCV: 89.7 fL (ref 80.0–100.0)
Platelets: 143 10*3/uL — ABNORMAL LOW (ref 150–400)
RBC: 2.63 MIL/uL — ABNORMAL LOW (ref 3.87–5.11)
RDW: 15.4 % (ref 11.5–15.5)
WBC: 4.8 10*3/uL (ref 4.0–10.5)
nRBC: 0 % (ref 0.0–0.2)

## 2019-05-14 LAB — GLUCOSE, CAPILLARY: Glucose-Capillary: 175 mg/dL — ABNORMAL HIGH (ref 70–99)

## 2019-05-14 MED ORDER — SODIUM CHLORIDE 0.9 % IV SOLN
510.0000 mg | Freq: Once | INTRAVENOUS | Status: AC
  Administered 2019-05-14: 510 mg via INTRAVENOUS
  Filled 2019-05-14: qty 17

## 2019-05-14 NOTE — Progress Notes (Signed)
PROGRESS NOTE    Theresa Cain  [id number] DOB: Jan 27, 1927 DOA: 05/10/2019 PCP: Kendrick Ranch, MD    Brief Narrative:  Theresa Cain is a 86 y.o. female with medical history significant of hypertension, hyperlipidemia, GERD, hypothyroidism, depression, stomach cancer 2013, s/p of TAVR, PVD, atrial fibrillation on Eliquis, CKD stage III, dCHF, anemia, who presents with generalized weakness and black stool.pt was found to have hemoglobin dropped from 13.1 on 09/12/2017 to 6.6, 6.2, INR 1.4, lactic acid 1.4, renal function close to baseline, temperature normal, blood pressure 132/52, heart rate 64, RR 16, oxygen saturation 99% on room air.  Patient is admitted to telemetry bed as inpatient.  GI, Dr. Marylene Land was consulted.    Consultants:   GI  Procedures: None  Antimicrobials:   None   Subjective: Had 1 dark bowel movement this AM.  Patient denies abdominal pain, dizziness, shortness of breath.  Reports feeling better after transfusion. Objective: Vitals:   05/13/19 1613 05/13/19 1925 05/14/19 0531 05/14/19 0850  BP: 140/63 (!) 135/52 (!) 123/54 (!) 137/54  Pulse: (!) 58 62 (!) 58 (!) 59  Resp:  18 18 18   Temp: 98 F (36.7 C) 97.8 F (36.6 C) 98 F (36.7 C) (!) 97.4 F (36.3 C)  TempSrc: Oral Oral Oral Oral  SpO2: 98% 99% 96% 95%  Weight:      Height:        Intake/Output Summary (Last 24 hours) at 05/14/2019 1325 Last data filed at 05/14/2019 0700 Gross per 24 hour  Intake 1005.22 ml  Output 450 ml  Net 555.22 ml   Filed Weights   05/10/19 1144 05/13/19 1121  Weight: 90.1 kg 90.2 kg    Examination:  General exam: Appears calm and comfortable, NAD, pleasant Respiratory system: Clear to auscultation. Respiratory effort normal. Cardiovascular system: S1 & S2 heard, RRR. No murmurs, rubs, gallops or clicks.  Gastrointestinal system: Abdomen is nondistended, soft and nontender.  Normal bowel sounds heard.no guarding or rebound Central nervous system: Alert  and oriented.  Cranial nerve grossly intact Extremities: No edema  Skin: Warm dry Psychiatry: Judgement and insight appear normal. Mood & affect appropriate.     Data Reviewed: I have personally reviewed following labs and imaging studies  CBC: Recent Labs  Lab 05/11/19 2138 05/11/19 2138 05/12/19 0205 05/12/19 0205 05/12/19 0540 05/12/19 0540 05/12/19 0845 05/12/19 1628 05/13/19 0902 05/13/19 1819 05/14/19 0804  WBC 7.2  --  6.4  --  5.6  --  6.1  --   --   --  4.8  HGB 7.6*   < > 7.5*   < > 7.2*   < > 7.6* 7.6* 7.5* 7.4* 7.3*  HCT 24.0*   < > 23.9*   < > 22.9*   < > 24.6* 24.1* 24.2* 24.4* 23.6*  MCV 90.2  --  91.6  --  91.2  --  90.4  --   --   --  89.7  PLT 135*  --  133*  --  134*  --  145*  --   --   --  143*   < > = values in this interval not displayed.   Basic Metabolic Panel: Recent Labs  Lab 05/10/19 1156 05/11/19 0200 05/12/19 0205  NA 140 141 138  K 4.1 3.9 3.7  CL 108 109 105  CO2 23 24 25   GLUCOSE 148* 150* 135*  BUN 22 19 19   CREATININE 1.34* 1.19* 1.31*  CALCIUM 8.8* 8.3* 8.0*   GFR: Estimated Creatinine  Clearance: 36 mL/min (A) (by C-G formula based on SCr of 1.31 mg/dL (H)). Liver Function Tests: Recent Labs  Lab 05/10/19 1156  AST 17  ALT 10  ALKPHOS 53  BILITOT 0.5  PROT 6.4*  ALBUMIN 3.6   No results for input(s): LIPASE, AMYLASE in the last 168 hours. No results for input(s): AMMONIA in the last 168 hours. Coagulation Profile: Recent Labs  Lab 05/10/19 1156  INR 1.4*   Cardiac Enzymes: No results for input(s): CKTOTAL, CKMB, CKMBINDEX, TROPONINI in the last 168 hours. BNP (last 3 results) No results for input(s): PROBNP in the last 8760 hours. HbA1C: No results for input(s): HGBA1C in the last 72 hours. CBG: Recent Labs  Lab 05/11/19 1155 05/11/19 1637 05/12/19 0818 05/13/19 0740 05/14/19 0934  GLUCAP 127* 129* 125* 135* 175*   Lipid Profile: No results for input(s): CHOL, HDL, LDLCALC, TRIG, CHOLHDL, LDLDIRECT  in the last 72 hours. Thyroid Function Tests: No results for input(s): TSH, T4TOTAL, FREET4, T3FREE, THYROIDAB in the last 72 hours. Anemia Panel: No results for input(s): VITAMINB12, FOLATE, FERRITIN, TIBC, IRON, RETICCTPCT in the last 72 hours. Sepsis Labs: No results for input(s): PROCALCITON, LATICACIDVEN in the last 168 hours.  Recent Results (from the past 240 hour(s))  SARS CORONAVIRUS 2 (TAT 6-24 HRS) Nasopharyngeal Nasopharyngeal Swab     Status: None   Collection Time: 05/10/19  2:36 PM   Specimen: Nasopharyngeal Swab  Result Value Ref Range Status   SARS Coronavirus 2 NEGATIVE NEGATIVE Final    Comment: (NOTE) SARS-CoV-2 target nucleic acids are NOT DETECTED. The SARS-CoV-2 RNA is generally detectable in upper and lower respiratory specimens during the acute phase of infection. Negative results do not preclude SARS-CoV-2 infection, do not rule out co-infections with other pathogens, and should not be used as the sole basis for treatment or other patient management decisions. Negative results must be combined with clinical observations, patient history, and epidemiological information. The expected result is Negative. Fact Sheet for Patients: SugarRoll.be Fact Sheet for Healthcare Providers: https://www.woods-mathews.com/ This test is not yet approved or cleared by the [place] FDA and  has been authorized for detection and/or diagnosis of SARS-CoV-2 by FDA under an Emergency Use Authorization (EUA). This EUA will remain  in effect (meaning this test can be used) for the duration of the COVID-19 declaration under Section 56 4(b)(1) of the Act, 21 U.S.C. section 360bbb-3(b)(1), unless the authorization is terminated or revoked sooner. Performed at [facility], [place] [address], [place], [place] [address]          Radiology Studies: No results found.      Scheduled Meds: . Chlorhexidine Gluconate Cloth  6  each Topical Daily  . diltiazem  180 mg Oral Daily  . escitalopram  10 mg Oral QHS  . ferrous sulfate  325 mg Oral Q breakfast  . levothyroxine  50 mcg Oral Daily  . pantoprazole  40 mg Intravenous Q12H  . pravastatin  40 mg Oral q1800  . sodium chloride flush  10-40 mL Intracatheter Q12H  . torsemide  20 mg Oral Daily   Continuous Infusions:   Assessment & Plan:   Principal Problem:   Acute GI bleeding Active Problems:   Chronic diastolic CHF (congestive heart failure) (HCC)   HTN (hypertension)   Permanent atrial fibrillation (HCC)   GERD (gastroesophageal reflux disease)   Coronary artery disease involving native coronary artery of native heart   Hypothyroidism   HLD (hyperlipidemia)   Anemia due to blood  loss   CKD (chronic kidney disease), stage IIIa   GIB (gastrointestinal bleeding): Hgb 13.1 -->6.6.  Currently hemodynamically stable.  -GI following -Continue to hold Eliquis -Status post transfusion of  2 units of PRBC -Continue IV pantoprazole gtt - Zofran IV for nausea - Avoid NSAIDs and SQ heparin - Monitor closely and follow q6h cbc, transfuse as necessary, if Hgb<7.0 -Status post EGD on 2/19-found with large, polypoid, partially circumferential involving one half of the lumen circumference mass with oozing bleeding and stigmata of recent bleed in the cardia.  Status post hemostatic spray Oncology was consulted, input was appreciated-recommend 1 dose of Feraheme. Will need PET and other imaging work-up to be done as outpatient Ideally patient will need surgical resection for neuroendocrine tumor and once pathology comes back, oncology will reach out to surgical oncologist Dr. Elio Forget at [facility].   Acute iron deficiency anemia-secondary to upper GI bleed from bleeding malignancy Status post hemostatic spray If continues to bleed over the weekend may need to consult GI versus vascular surgery for potential options Could also give 1 dose of octreotide pending  pathology Will give 1 dose of Feraheme Check H&H tomorrow See above    Chronic diastolic CHF (congestive heart failure) ([place]): 2D echo on 05/20/2018 showed EF> 65%.  Currently compensated. -Continue home torsemide   HTN:  -Continue home medications: Diltiazem, torsemide -hydralazine prn  Permanent atrial fibrillation ([place]): -Continue to hold Eliquis due to GI bleed- until f/u with oncology as outpt -continue Cardizem  GERD (gastroesophageal reflux disease): -on protonix  Coronary artery disease involving native coronary artery of native heart: -Currently asymptomatic -continue pravastatin  Hypothyroidism -Continue Synthroid  HLD (hyperlipidemia) -Continue pravastatin    CKD (chronic kidney disease), stage IIIa: Stable.  Baseline creatinine 1.0-1.3.   DVT prophylaxis: SCD Code Status: Full Family Communication: None at bedside Disposition Plan: Had one dark BM today, will monitor today, if cbc stable and no further dark stool , possible d/c in am.       LOS: 4 days   Time spent: 45 minutes with more than 50% on [place], MD [facility] Pager [phone]  If 7PM-7AM, please contact night-coverage [email or web address] Password [email or web address] 05/14/2019, 1:25 PM Patient ID: Theresa Cain, female   DOB: [birth year], 86 y.o.   MRN: [id number]

## 2019-05-15 ENCOUNTER — Encounter: Payer: Self-pay | Admitting: Emergency Medicine

## 2019-05-15 ENCOUNTER — Inpatient Hospital Stay
Admission: EM | Admit: 2019-05-15 | Discharge: 2019-05-17 | DRG: 074 | Disposition: A | Discharge status: Home / Self Care | Payer: Medicare Other | Attending: Hospitalist | Admitting: Hospitalist

## 2019-05-15 ENCOUNTER — Other Ambulatory Visit: Payer: Self-pay

## 2019-05-15 ENCOUNTER — Emergency Department: Payer: Medicare Other

## 2019-05-15 ENCOUNTER — Observation Stay: Payer: Medicare Other

## 2019-05-15 DIAGNOSIS — Z880 Allergy status to penicillin: Secondary | ICD-10-CM

## 2019-05-15 DIAGNOSIS — E039 Hypothyroidism, unspecified: Secondary | ICD-10-CM | POA: Diagnosis present

## 2019-05-15 DIAGNOSIS — M21331 Wrist drop, right wrist: Secondary | ICD-10-CM | POA: Diagnosis not present

## 2019-05-15 DIAGNOSIS — Z952 Presence of prosthetic heart valve: Secondary | ICD-10-CM

## 2019-05-15 DIAGNOSIS — R29700 NIHSS score 0: Secondary | ICD-10-CM | POA: Diagnosis present

## 2019-05-15 DIAGNOSIS — I2510 Atherosclerotic heart disease of native coronary artery without angina pectoris: Secondary | ICD-10-CM | POA: Diagnosis present

## 2019-05-15 DIAGNOSIS — N183 Chronic kidney disease, stage 3 unspecified: Secondary | ICD-10-CM | POA: Diagnosis present

## 2019-05-15 DIAGNOSIS — I083 Combined rheumatic disorders of mitral, aortic and tricuspid valves: Secondary | ICD-10-CM | POA: Diagnosis present

## 2019-05-15 DIAGNOSIS — C169 Malignant neoplasm of stomach, unspecified: Secondary | ICD-10-CM | POA: Diagnosis present

## 2019-05-15 DIAGNOSIS — Z90710 Acquired absence of both cervix and uterus: Secondary | ICD-10-CM

## 2019-05-15 DIAGNOSIS — N1831 Chronic kidney disease, stage 3a: Secondary | ICD-10-CM | POA: Diagnosis present

## 2019-05-15 DIAGNOSIS — Z953 Presence of xenogenic heart valve: Secondary | ICD-10-CM

## 2019-05-15 DIAGNOSIS — D500 Iron deficiency anemia secondary to blood loss (chronic): Secondary | ICD-10-CM | POA: Diagnosis present

## 2019-05-15 DIAGNOSIS — D3A8 Other benign neuroendocrine tumors: Secondary | ICD-10-CM | POA: Diagnosis present

## 2019-05-15 DIAGNOSIS — M6281 Muscle weakness (generalized): Secondary | ICD-10-CM

## 2019-05-15 DIAGNOSIS — Z79890 Hormone replacement therapy (postmenopausal): Secondary | ICD-10-CM

## 2019-05-15 DIAGNOSIS — Z9049 Acquired absence of other specified parts of digestive tract: Secondary | ICD-10-CM

## 2019-05-15 DIAGNOSIS — Z8249 Family history of ischemic heart disease and other diseases of the circulatory system: Secondary | ICD-10-CM

## 2019-05-15 DIAGNOSIS — I739 Peripheral vascular disease, unspecified: Secondary | ICD-10-CM | POA: Diagnosis present

## 2019-05-15 DIAGNOSIS — I639 Cerebral infarction, unspecified: Secondary | ICD-10-CM

## 2019-05-15 DIAGNOSIS — I4821 Permanent atrial fibrillation: Secondary | ICD-10-CM | POA: Diagnosis present

## 2019-05-15 DIAGNOSIS — I5032 Chronic diastolic (congestive) heart failure: Secondary | ICD-10-CM | POA: Diagnosis present

## 2019-05-15 DIAGNOSIS — R2990 Unspecified symptoms and signs involving the nervous system: Secondary | ICD-10-CM | POA: Diagnosis present

## 2019-05-15 DIAGNOSIS — E785 Hyperlipidemia, unspecified: Secondary | ICD-10-CM | POA: Diagnosis present

## 2019-05-15 DIAGNOSIS — I6522 Occlusion and stenosis of left carotid artery: Secondary | ICD-10-CM | POA: Diagnosis present

## 2019-05-15 DIAGNOSIS — N189 Chronic kidney disease, unspecified: Secondary | ICD-10-CM | POA: Diagnosis present

## 2019-05-15 DIAGNOSIS — R29818 Other symptoms and signs involving the nervous system: Secondary | ICD-10-CM

## 2019-05-15 DIAGNOSIS — R29898 Other symptoms and signs involving the musculoskeletal system: Secondary | ICD-10-CM

## 2019-05-15 DIAGNOSIS — Z888 Allergy status to other drugs, medicaments and biological substances status: Secondary | ICD-10-CM

## 2019-05-15 DIAGNOSIS — Z20822 Contact with and (suspected) exposure to covid-19: Secondary | ICD-10-CM | POA: Diagnosis present

## 2019-05-15 DIAGNOSIS — Z79899 Other long term (current) drug therapy: Secondary | ICD-10-CM

## 2019-05-15 DIAGNOSIS — Z882 Allergy status to sulfonamides status: Secondary | ICD-10-CM

## 2019-05-15 DIAGNOSIS — I10 Essential (primary) hypertension: Secondary | ICD-10-CM | POA: Diagnosis present

## 2019-05-15 DIAGNOSIS — Z950 Presence of cardiac pacemaker: Secondary | ICD-10-CM

## 2019-05-15 DIAGNOSIS — G5631 Lesion of radial nerve, right upper limb: Secondary | ICD-10-CM | POA: Diagnosis present

## 2019-05-15 DIAGNOSIS — D490 Neoplasm of unspecified behavior of digestive system: Secondary | ICD-10-CM

## 2019-05-15 DIAGNOSIS — K219 Gastro-esophageal reflux disease without esophagitis: Secondary | ICD-10-CM | POA: Diagnosis present

## 2019-05-15 DIAGNOSIS — I130 Hypertensive heart and chronic kidney disease with heart failure and stage 1 through stage 4 chronic kidney disease, or unspecified chronic kidney disease: Secondary | ICD-10-CM | POA: Diagnosis present

## 2019-05-15 DIAGNOSIS — K921 Melena: Secondary | ICD-10-CM | POA: Diagnosis present

## 2019-05-15 LAB — COMPREHENSIVE METABOLIC PANEL
ALT: 8 U/L (ref 0–44)
AST: 14 U/L — ABNORMAL LOW (ref 15–41)
Albumin: 3.4 g/dL — ABNORMAL LOW (ref 3.5–5.0)
Alkaline Phosphatase: 59 U/L (ref 38–126)
Anion gap: 10 (ref 5–15)
BUN: 14 mg/dL (ref 8–23)
CO2: 25 mmol/L (ref 22–32)
Calcium: 8.9 mg/dL (ref 8.9–10.3)
Chloride: 104 mmol/L (ref 98–111)
Creatinine, Ser: 1.64 mg/dL — ABNORMAL HIGH (ref 0.44–1.00)
GFR calc Af Amer: 32 mL/min — ABNORMAL LOW (ref >60)
GFR calc non Af Amer: 27 mL/min — ABNORMAL LOW (ref >60)
Glucose, Bld: 165 mg/dL — ABNORMAL HIGH (ref 70–99)
Potassium: 4 mmol/L (ref 3.5–5.1)
Sodium: 139 mmol/L (ref 135–145)
Total Bilirubin: 0.6 mg/dL (ref 0.3–1.2)
Total Protein: 6.5 g/dL (ref 6.5–8.1)

## 2019-05-15 LAB — CBC
HCT: 23.6 % — ABNORMAL LOW (ref 36.0–46.0)
HCT: 25.2 % — ABNORMAL LOW (ref 36.0–46.0)
Hemoglobin: 7.3 g/dL — ABNORMAL LOW (ref 12.0–15.0)
Hemoglobin: 7.8 g/dL — ABNORMAL LOW (ref 12.0–15.0)
MCH: 27.5 pg (ref 26.0–34.0)
MCH: 27.6 pg (ref 26.0–34.0)
MCHC: 30.9 g/dL (ref 30.0–36.0)
MCHC: 31 g/dL (ref 30.0–36.0)
MCV: 89.1 fL (ref 80.0–100.0)
MCV: 89 fL (ref 80.0–100.0)
Platelets: 160 10*3/uL (ref 150–400)
Platelets: 216 10*3/uL (ref 150–400)
RBC: 2.65 MIL/uL — ABNORMAL LOW (ref 3.87–5.11)
RBC: 2.83 MIL/uL — ABNORMAL LOW (ref 3.87–5.11)
RDW: 15.5 % (ref 11.5–15.5)
RDW: 15.8 % — ABNORMAL HIGH (ref 11.5–15.5)
WBC: 4.1 10*3/uL (ref 4.0–10.5)
WBC: 6.7 10*3/uL (ref 4.0–10.5)
nRBC: 0 % (ref 0.0–0.2)
nRBC: 0 % (ref 0.0–0.2)

## 2019-05-15 LAB — BASIC METABOLIC PANEL
Anion gap: 7 (ref 5–15)
BUN: 10 mg/dL (ref 8–23)
CO2: 27 mmol/L (ref 22–32)
Calcium: 8.2 mg/dL — ABNORMAL LOW (ref 8.9–10.3)
Chloride: 104 mmol/L (ref 98–111)
Creatinine, Ser: 1.41 mg/dL — ABNORMAL HIGH (ref 0.44–1.00)
GFR calc Af Amer: 38 mL/min — ABNORMAL LOW (ref >60)
GFR calc non Af Amer: 33 mL/min — ABNORMAL LOW (ref >60)
Glucose, Bld: 140 mg/dL — ABNORMAL HIGH (ref 70–99)
Potassium: 3.4 mmol/L — ABNORMAL LOW (ref 3.5–5.1)
Sodium: 138 mmol/L (ref 135–145)

## 2019-05-15 LAB — SAMPLE TO BLOOD BANK

## 2019-05-15 LAB — PROTIME-INR
INR: 1.1 (ref 0.8–1.2)
Prothrombin Time: 14.5 seconds (ref 11.4–15.2)

## 2019-05-15 LAB — TROPONIN I (HIGH SENSITIVITY): Troponin I (High Sensitivity): 24 ng/L — ABNORMAL HIGH (ref <18)

## 2019-05-15 MED ORDER — OMEPRAZOLE 40 MG PO CPDR: 40.0000 mg | DELAYED_RELEASE_CAPSULE | Freq: Two times a day (BID) | ORAL | 0 refills | Status: DC

## 2019-05-15 MED ORDER — POTASSIUM CHLORIDE CRYS ER 20 MEQ PO TBCR
40.0000 meq | EXTENDED_RELEASE_TABLET | Freq: Once | ORAL | Status: AC
  Administered 2019-05-15: 40 meq via ORAL
  Filled 2019-05-15: qty 2

## 2019-05-15 MED ORDER — SENNOSIDES-DOCUSATE SODIUM 8.6-50 MG PO TABS: 1.0000 | ORAL_TABLET | Freq: Every evening | ORAL | Status: DC | PRN

## 2019-05-15 MED ORDER — ACETAMINOPHEN 325 MG PO TABS: 650.0000 mg | ORAL_TABLET | ORAL | Status: DC | PRN

## 2019-05-15 MED ORDER — LACTATED RINGERS IV BOLUS
1000.0000 mL | Freq: Once | INTRAVENOUS | Status: AC
  Administered 2019-05-15: 1000 mL via INTRAVENOUS

## 2019-05-15 MED ORDER — ASPIRIN EC 325 MG PO TBEC
325.0000 mg | DELAYED_RELEASE_TABLET | Freq: Every day | ORAL | Status: DC
  Administered 2019-05-16 – 2019-05-17 (×2): 325 mg via ORAL
  Filled 2019-05-15 (×2): qty 1

## 2019-05-15 MED ORDER — ACETAMINOPHEN 160 MG/5ML PO SOLN
650.0000 mg | ORAL | Status: DC | PRN
  Filled 2019-05-15: qty 20.3

## 2019-05-15 MED ORDER — STROKE: EARLY STAGES OF RECOVERY BOOK: Freq: Once | Status: AC

## 2019-05-15 MED ORDER — ASPIRIN 300 MG RE SUPP
300.0000 mg | Freq: Every day | RECTAL | Status: DC
  Filled 2019-05-15 (×2): qty 1

## 2019-05-15 MED ORDER — ACETAMINOPHEN 650 MG RE SUPP: 650.0000 mg | RECTAL | Status: DC | PRN

## 2019-05-15 NOTE — ED Triage Notes (Signed)
Pt arrives POV to triage with c/o weakness x 1 month. Pt was recently DC from [place] with GI bleed. Pt states that she has been given blood but still feeling extremely weak. Pt is able to answer full questions and is alert and oriented x 4.

## 2019-05-15 NOTE — ED Provider Notes (Signed)
[facility] Emergency Department Provider Note   ____________________________________________   First MD Initiated Contact with Patient 05/15/19 1944     (approximate)  I have reviewed the triage vital signs and the nursing notes.   HISTORY  Chief Complaint Weakness    HPI Theresa Cain is a 86 y.o. female with past medical history of permanent atrial fibrillation, CHF, CKD, CAD, and hypertension who presents to the ED complaining of weakness.  Patient reports that she had acute onset of right arm weakness sometime earlier today.  She states that it seems to primarily affect her right wrist and she has had difficulty whenever she goes to pick up an object.  She is also noticed some numbness in her right arm, but denies any vision changes, speech changes, or lower extremity weakness.  She was discharged from the hospital earlier today following admission for upper GI bleed, where endoscopy revealed suspected recurrence of malignancy.  Her Eliquis was subsequently stopped, but she reports ongoing melena.        Past Medical History:  Diagnosis Date  . Acute on chronic diastolic CHF (congestive heart failure) ([place]) 02/22/2016  . Anemia   . Bradycardia   . Carotid artery occlusion   . Chronic diastolic CHF (congestive heart failure) ([place])   . Chronic kidney disease    elevated creatinne  . Coronary artery disease involving native coronary artery of native heart   . GERD (gastroesophageal reflux disease)   . HLD (hyperlipidemia)   . Hypertension   . Hypothyroidism   . Obesity   . Osteoarthritis   . Permanent atrial fibrillation ([place])   . Presence of permanent cardiac pacemaker   . PVD (peripheral vascular disease) ([place])   . 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 Severe aortic stenosis   . Stomach cancer ([place]) ~ 2013  . Vertigo      Patient Active Problem List   Diagnosis Date Noted  . Acute focal neurological deficit, onset within 3-24 hours 05/15/2019  . Gastric tumor 05/15/2019  . CKD (chronic kidney disease), stage IIIa 05/10/2019  . Hypothyroidism   . HLD (hyperlipidemia)   . Anemia due to GI blood loss   . Acute GI bleeding   . 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 Severe aortic stenosis   . Coronary artery disease involving native coronary artery of native heart   . Chronic diastolic CHF (congestive heart failure) ([place]) 03/23/2017  . HTN (hypertension) 03/23/2017  . Permanent atrial fibrillation ([place]) 03/23/2017  . GERD (gastroesophageal reflux disease) 03/23/2017    Past Surgical History:  Procedure Laterality Date  . CARDIOVASCULAR STRESS TEST    . CARDIOVERSION N/A 08/08/2016   Procedure: CARDIOVERSION;  Surgeon: Sueanne Margarita, MD;  Location: [facility] ENDOSCOPY;  Service: Cardiovascular;  Laterality: N/A;  . CAROTID ENDARTERECTOMY Right ~ 2005  . CATARACT EXTRACTION W/ INTRAOCULAR LENS IMPLANT Right   . CHOLECYSTECTOMY  ~ 2013   "part of her [place] OR"  . EP IMPLANTABLE DEVICE N/A 03/04/2016   Procedure: Pacemaker Implant;  Surgeon: Will Meredith Leeds, MD;  Location: [place] CV LAB;  Service: Cardiovascular;  Laterality: N/A;  . EP IMPLANTABLE DEVICE N/A 03/05/2016   Procedure: Lead Revision/Repair;  Surgeon: Deboraha Sprang, MD;  Location: [place] CV LAB;  Service: Cardiovascular;  Laterality: N/A;  . INSERT / REPLACE / REMOVE PACEMAKER    .  LEFT HEART CATH AND CORONARY ANGIOGRAPHY N/A 03/27/2017   Procedure: LEFT HEART CATH AND CORONARY ANGIOGRAPHY;  Surgeon: Minna Merritts, MD;  Location: [place] CV LAB;  Service: Cardiovascular;  Laterality: N/A;  . TEE WITHOUT CARDIOVERSION N/A 05/12/2017   Procedure: TRANSESOPHAGEAL ECHOCARDIOGRAM (TEE);  Surgeon: Sherren Mocha, MD;  Location: [place];  Service: Open Heart Surgery;  Laterality: N/A;  . TRANSCATHETER  AORTIC VALVE REPLACEMENT, TRANSFEMORAL N/A 05/12/2017   Procedure: TRANSCATHETER AORTIC VALVE REPLACEMENT, TRANSFEMORAL using a 71mm Edwards Sapien 3 Aortic Valve;  Surgeon: Sherren Mocha, MD;  Location: [place];  Service: Open Heart Surgery;  Laterality: N/A;  . US ECHOCARDIOGRAPHY    . VAGINAL HYSTERECTOMY    . WHIPPLE PROCEDURE  ~ 2013    Prior to Admission medications   Medication Sig Start Date End Date Taking? Authorizing Provider  acetaminophen (TYLENOL) 500 MG tablet Take 1,000 mg by mouth every 6 (six) hours as needed for moderate pain.   Yes [provider]  diltiazem (CARDIZEM CD) 180 MG 24 hr capsule Take 1 capsule (180 mg total) by mouth daily. 03/27/17  Yes Vaughan Basta, MD  escitalopram (LEXAPRO) 10 MG tablet Take 10 mg by mouth at bedtime.    Yes [provider]  ferrous sulfate 325 (65 FE) MG tablet Take 325 mg by mouth daily with breakfast.   Yes [provider]  levothyroxine (SYNTHROID) 50 MCG tablet Take 1 tablet (50 mcg total) by mouth daily. 03/27/17 05/15/19 Yes Vaughan Basta, MD  omeprazole (PRILOSEC) 40 MG capsule Take 1 capsule (40 mg total) by mouth 2 (two) times daily before a meal. 05/15/19  Yes Nolberto Hanlon, MD  pravastatin (PRAVACHOL) 40 MG tablet Take 40 mg by mouth every evening.    Yes [provider]  Vitamin D, Ergocalciferol, (DRISDOL) 50000 units CAPS capsule Take 50,000 Units by mouth 2 (two) times a week. Take on Sunday and Thursday   Yes [provider]  ELIQUIS 2.5 MG TABS tablet Take 2.5 mg by mouth 2 (two) times daily. 04/14/19   [provider]    Allergies Penicillins, Sulfa antibiotics, and Cymbalta [duloxetine hcl]  Family History  Problem Relation Age of Onset  . Heart disease Sister     Social History Social History   Tobacco Use  . Smoking status: Never Smoker  . Smokeless tobacco: Never Used  Substance Use Topics  . Alcohol use: No  . Drug use: No    Review of  Systems  Constitutional: No fever/chills Eyes: No visual changes. ENT: No sore throat. Cardiovascular: Denies chest pain. Respiratory: Denies shortness of breath. Gastrointestinal: No abdominal pain.  No nausea, no vomiting.  No diarrhea.  No constipation.  Positive for melena. Genitourinary: Negative for dysuria. Musculoskeletal: Negative for back pain. Skin: Negative for rash. Neurological: Negative for headaches.  Positive for right arm numbness and weakness.  ____________________________________________   PHYSICAL EXAM:  VITAL SIGNS: ED Triage Vitals  Enc Vitals Group     BP 05/15/19 1942 (!) 150/48     Pulse Rate 05/15/19 1942 65     Resp 05/15/19 1942 (!) 25     Temp 05/15/19 1942 97.8 F (36.6 C)     Temp Source 05/15/19 1942 Oral     SpO2 05/15/19 1942 98 %     Weight 05/15/19 1925 190 lb (86.2 kg)     Height 05/15/19 1925 6' (1.829 m)     Head Circumference --      Peak Flow --  Pain Score 05/15/19 1924 0     Pain Loc --      Pain Edu? --      Excl. in [place]? --     Constitutional: Alert and oriented. Eyes: Conjunctivae are normal. Head: Atraumatic. Nose: No congestion/rhinnorhea. Mouth/Throat: Mucous membranes are moist. Neck: Normal ROM Cardiovascular: Normal rate, irregularly irregular rhythm. Grossly normal heart sounds. Respiratory: Normal respiratory effort.  No retractions. Lungs CTAB. Gastrointestinal: Soft and nontender. No distention.  Melanotic guaiac positive stool noted on rectal exam. Genitourinary: deferred Musculoskeletal: No lower extremity tenderness nor edema. Neurologic:  Normal speech and language.  4-5 strength in right upper extremity with pronator drift noted, 5-5 strength in all other extremities. Skin:  Skin is warm, dry and intact. No rash noted. Psychiatric: Mood and affect are normal. Speech and behavior are normal.  ____________________________________________   LABS (all labs ordered are listed, but only abnormal results  are displayed)  Labs Reviewed  CBC - Abnormal; Notable for the following components:      Result Value   RBC 2.83 (*)    Hemoglobin 7.8 (*)    HCT 25.2 (*)    RDW 15.8 (*)    All other components within normal limits  COMPREHENSIVE METABOLIC PANEL - Abnormal; Notable for the following components:   Glucose, Bld 165 (*)    Creatinine, Ser 1.64 (*)    Albumin 3.4 (*)    AST 14 (*)    GFR calc non Af Amer 27 (*)    GFR calc Af Amer 32 (*)    All other components within normal limits  TROPONIN I (HIGH SENSITIVITY) - Abnormal; Notable for the following components:   Troponin I (High Sensitivity) 24 (*)    All other components within normal limits  SARS CORONAVIRUS 2 (TAT 6-24 HRS)  PROTIME-INR  URINALYSIS, COMPLETE (UACMP) WITH MICROSCOPIC  HEMOGLOBIN A1C  LIPID PANEL  CBG MONITORING, ED  SAMPLE TO BLOOD BANK  TROPONIN I (HIGH SENSITIVITY)   ____________________________________________  EKG  ED ECG REPORT I, Blake Divine, the attending physician, personally viewed and interpreted this ECG.   Date: 05/16/2019  EKG Time: 19:40  Rate: 70  Rhythm: atrial fibrillation, rate 70, Ventricularly paced complexes  Axis: Normal  Intervals:none  ST&T Change: T wave changes similar to prior   PROCEDURES  Procedure(s) performed (including Critical Care):  Procedures   ____________________________________________   INITIAL IMPRESSION / ASSESSMENT AND PLAN / ED COURSE       86 year old female presents to the ED with acute onset of right upper extremity weakness after being discharged from the hospital earlier today for upper GI bleed and concern for return of her prior malignancy.  Given her permanent A. fib, she is at high risk for stroke now that she is off Eliquis.  CT head is negative for acute process, but she will require admission for further stroke work-up.  While she has melanotic stool on exam, her H&H is stable and this is likely residual from prior bleeding.  Case  discussed with hospitalist, who excepts patient for admission.      ____________________________________________   FINAL CLINICAL IMPRESSION(S) / ED DIAGNOSES  Final diagnoses:  Right arm weakness  Permanent atrial fibrillation [facility])     ED Discharge Orders    None       Note:  This document was prepared using Dragon voice recognition software and may include unintentional dictation errors.   Blake Divine, MD 05/16/19 Shelah Lewandowsky

## 2019-05-15 NOTE — ED Notes (Signed)
Iv attempted by Jerilee Hoh.

## 2019-05-15 NOTE — Progress Notes (Signed)
Discharged home with siter by private vehicle.

## 2019-05-15 NOTE — Discharge Summary (Signed)
Theresa Cain [id number] DOB: 04/17/1926 DOA: 05/10/2019  PCP: Kendrick Ranch, MD  Admit date: 05/10/2019 Discharge date: 05/15/2019  Admitted From: Home Disposition: Home  Recommendations for Outpatient Follow-up:  1. Follow up with PCP in 1 week 2. Please obtain BMP/CBC in one week 3. Please follow up on the following pending results: 4. Follow-up with Dr. Janese Banks in 1 week, oncology. 5. Follow up with Dr. Vicente Males gastroenterology in 1 week.     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 Patient was admitted to the hospital.  She was found with GI bleed.  Her Eliquis was held.  She received IV PPI drip.  GI was consulted.  She received 2 units of packed red blood cells.  She underwent EGD on 05/13/19 and was found with  large, polypoid, partially circumferential involving one half of the lumen circumference mass with oozing bleeding and stigmata of recent bleed in the cardia.  Status post hemostatic spray.  Oncology was consulted and they recommended 1 dose of Feraheme which she received yesterday.  Oncology wanted to wait for the final pathology results and if this confirms neuroendocrine tumor she would need outpatient PET scan.  Ideally surgical resection will be recommended for neuroendocrine tumor and once pathology comes back, Dr. Janese Banks oncology will reach out to surgical oncologist at [facility], Dr. Doyle Askew.   She will set the patient up for further IV iron as outpatient.  Yesterday she had one dark stool however this likely was residual bleed.  As her hemoglobin and hematocrit have been stable.  Her CBC this a.m. is stable without any drop.  Her creatinine is mildly elevated at 1.41 so she will be discharged home with holding her torsemide.  Instructed not to take her Eliquis, any aspirin or NSAIDs until she follows up with oncology.  She is stable to be discharged home.    Discharge Diagnoses:  Principal Problem:   Acute GI bleeding Active Problems:   Chronic diastolic CHF (congestive heart failure) (HCC)   HTN (hypertension)   Permanent atrial fibrillation (HCC)   GERD (gastroesophageal reflux disease)   Coronary artery disease involving native coronary artery of native heart   Hypothyroidism   HLD (hyperlipidemia)   Anemia due to blood loss   CKD (chronic kidney disease), stage IIIa    Discharge Instructions  Discharge Instructions    Diet - low sodium heart healthy   Complete by: As directed    Discharge instructions   Complete by: As directed    Follow up with primary care within 3 days for blood work Stop aspirin, NSAIDS, ibuprofen and Eliquis until you see oncology Dr. Janese Banks. Follow up with Dr. Vicente Males GI in one week Follow up with Dr. Janese Banks oncology in one week Discuss Torsemide with your primary care whether you need to change the frequency   Increase activity slowly   Complete by: As directed      Allergies as of 05/15/2019      Reactions   Penicillins Hives, Other (See Comments)   PATIENT HAS HAD  A PCN REACTION WITH IMMEDIATE RASH, FACIAL/TONGUE/THROAT SWELLING, SOB, OR LIGHTHEADEDNESS WITH HYPOTENSION:  #  #  #  YES  #  #  #   Has patient had a PCN reaction causing severe rash involving mucus membranes or skin necrosis: No Has patient had a PCN reaction that required hospitalization: No Has patient had a PCN reaction occurring within the last 10 years: No If all of the  above answers are "NO", then may proceed with Cephalosporin use.   Sulfa Antibiotics Hives   Cymbalta [duloxetine Hcl]    Sweat, feels like having a heart attack       Medication List    STOP taking these medications   apixaban 5 MG Tabs tablet Commonly known as: Eliquis   potassium chloride 10 MEQ tablet Commonly known as: KLOR-CON   torsemide 20 MG tablet Commonly known as: DEMADEX     TAKE these medications   acetaminophen 500 MG tablet Commonly known as: TYLENOL Take 1,000 mg by mouth every 6 (six) hours as needed for moderate pain.   diltiazem 180 MG 24 hr capsule Commonly known as: CARDIZEM CD Take 1 capsule (180 mg total) by mouth daily.   escitalopram 10 MG tablet Commonly known as: LEXAPRO Take 10 mg by mouth at bedtime.   ferrous sulfate 325 (65 FE) MG tablet Take 325 mg by mouth daily with breakfast.   levothyroxine 50 MCG tablet Commonly known as: Synthroid Take 1 tablet (50 mcg total) by mouth daily.   omeprazole 40 MG capsule Commonly known as: PRILOSEC Take 1 capsule (40 mg total) by mouth 2 (two) times daily before a meal. What changed:   medication strength  how much to take  when to take this   pravastatin 40 MG tablet Commonly known as: PRAVACHOL Take 40 mg by mouth every evening.   Vitamin D (Ergocalciferol) 1.25 MG (50000 UNIT) Caps capsule Commonly known as: DRISDOL Take 50,000 Units by mouth 2 (two) times a week. Take on Sunday and Thursday      Follow-up Information    Jonathon Bellows, MD Follow up in 1 week(s).   Specialty: Gastroenterology Contact information: [place] [address] [phone]        Sindy Guadeloupe, MD Follow up in 1 week(s).   Specialty: Oncology Contact information: [place] [address] [phone]        Kendrick Ranch, MD Follow up in 3 day(s).   Specialty: Internal Medicine Why: cbc Contact information: [address] [place] [id number] [phone]          Allergies  Allergen Reactions  . Penicillins Hives and Other (See Comments)    PATIENT HAS HAD A PCN REACTION WITH IMMEDIATE RASH, FACIAL/TONGUE/THROAT SWELLING, SOB, OR LIGHTHEADEDNESS WITH HYPOTENSION:  #  #  #  YES  #  #  #   Has patient had a PCN reaction causing severe rash involving mucus membranes or skin necrosis: No Has patient had a PCN reaction that required hospitalization: No Has patient had a PCN reaction occurring within the last 10 years: No If all of the above answers are "NO", then may proceed with Cephalosporin use.   . Sulfa Antibiotics Hives  . Cymbalta [Duloxetine Hcl]     Sweat, feels like having a heart attack     Consultations:  Gastroenterology, oncology   Procedures/Studies:  No results found.    Subjective: Denies any dizziness, lightheadedness, shortness of breath or chest  pain.  She states she actually feels better since her transfusion.  Has had no dark stool today.  Discharge Exam: Vitals:   05/15/19 0257 05/15/19 0728  BP: (!) 106/52 (!) 128/57  Pulse: 63 60  Resp: 20 18  Temp: 98 F (36.7 C) 97.7 F (36.5 C)  SpO2: 98% 97%   Vitals:   05/14/19 1505 05/14/19 2023 05/15/19 0257 05/15/19 0728  BP: (!) 125/54 (!) 130/47 (!) 106/52 (!) 128/57  Pulse: 60 61 63 60  Resp: 18 20 20 18   Temp: (!) 97.5 F (36.4 C) 98 F (36.7 C) 98 F (36.7 C) 97.7 F (36.5 C)  TempSrc: Oral Oral Oral Oral  SpO2: 97% 100% 98% 97%  Weight:      Height:        General: Pt is alert, awake, not in acute distress Cardiovascular: RRR, S1/S2 +, no rubs, no gallops Respiratory: CTA bilaterally, no wheezing, no rhonchi Abdominal: Soft, NT, ND, bowel sounds + Extremities: no edema, no cyanosis    The results of significant diagnostics from this hospitalization (including imaging, microbiology, ancillary and laboratory) are listed below for reference.     Microbiology: Recent Results (from the past  240 hour(s))  SARS CORONAVIRUS 2 (TAT 6-24 HRS) Nasopharyngeal Nasopharyngeal Swab     Status: None   Collection Time: 05/10/19  2:36 PM   Specimen: Nasopharyngeal Swab  Result Value Ref Range Status   SARS Coronavirus 2 NEGATIVE NEGATIVE Final    Comment: (NOTE) SARS-CoV-2 target nucleic acids are NOT DETECTED. The SARS-CoV-2 RNA is generally detectable in upper and lower respiratory specimens during the acute phase of infection. Negative results do not preclude SARS-CoV-2 infection, do not rule out co-infections with other pathogens, and should not be used as the sole basis for treatment or other patient management decisions. Negative results must be combined with clinical observations, patient history, and epidemiological information. The expected result is Negative. Fact Sheet for Patients: SugarRoll.be Fact Sheet for Healthcare Providers: https://www.woods-mathews.com/ This test is not yet approved or cleared by the [place] FDA and  has been authorized for detection and/or diagnosis of SARS-CoV-2 by FDA under an Emergency Use Authorization (EUA). This EUA will remain  in effect (meaning this test can be used) for the duration of the COVID-19 declaration under Section 56 4(b)(1) of the Act, 21 U.S.C. section 360bbb-3(b)(1), unless the authorization is terminated or revoked sooner. Performed at [facility], [place] [address], [place], [place] [address]      Labs: BNP (last 3 results) Recent Labs    05/10/19 1156  BNP [id number]*   Basic Metabolic Panel: Recent Labs  Lab 05/10/19 1156 05/11/19 0200 05/12/19 0205 05/15/19 0347  NA 140 141 138 138  K 4.1 3.9 3.7 3.4*  CL 108 109 105 104  CO2 23 24 25 27   GLUCOSE 148* 150* 135* 140*  BUN 22 19 19 10   CREATININE 1.34* 1.19* 1.31* 1.41*  CALCIUM 8.8* 8.3* 8.0* 8.2*   Liver Function Tests: Recent Labs  Lab 05/10/19 1156  AST 17  ALT 10  ALKPHOS 53  BILITOT 0.5   PROT 6.4*  ALBUMIN 3.6   No results for input(s): LIPASE, AMYLASE in the last 168 hours. No results for input(s): AMMONIA in the last 168 hours. CBC: Recent Labs  Lab 05/12/19 0205 05/12/19 0205 05/12/19 0540 05/12/19 0540 05/12/19 0845 05/12/19 0845 05/12/19 1628 05/13/19 0902 05/13/19 1819 05/14/19 0804 05/15/19 0347  WBC 6.4  --  5.6  --  6.1  --   --   --   --  4.8 4.1  HGB 7.5*   < > 7.2*   < > 7.6*   < > 7.6* 7.5* 7.4* 7.3* 7.3*  HCT 23.9*   < > 22.9*   < > 24.6*   < > 24.1* 24.2* 24.4* 23.6* 23.6*  MCV 91.6  --  91.2  --  90.4  --   --   --   --  89.7 89.1  PLT 133*  --  134*  --  145*  --   --   --   --  143* 160   < > = values in this interval not displayed.   Cardiac Enzymes: No results for input(s): CKTOTAL, CKMB, CKMBINDEX, TROPONINI in the last 168 hours. BNP: Invalid input(s): POCBNP CBG: Recent Labs  Lab 05/11/19 1155 05/11/19 1637 05/12/19 0818 05/13/19 0740 05/14/19 0934  GLUCAP 127* 129* 125* 135* 175*   D-Dimer No results for input(s): DDIMER in the last 72 hours. Hgb A1c No results for input(s): HGBA1C in the last 72 hours. Lipid Profile No results for input(s): CHOL, HDL, LDLCALC, TRIG, CHOLHDL, LDLDIRECT in the last 72 hours. Thyroid function studies No results for input(s): TSH, T4TOTAL, T3FREE, THYROIDAB in the last 72 hours.  Invalid input(s): FREET3 Anemia work up No results for input(s): VITAMINB12, FOLATE, FERRITIN, TIBC, IRON, RETICCTPCT in the last 72 hours. Urinalysis    Component Value Date/Time   COLORURINE YELLOW 05/08/2017 1057   APPEARANCEUR CLOUDY (A) 05/08/2017 1057   LABSPEC 1.021 05/08/2017 1057   PHURINE 5.0 05/08/2017 1057   GLUCOSEU NEGATIVE 05/08/2017 1057   HGBUR NEGATIVE 05/08/2017 1057   BILIRUBINUR NEGATIVE 05/08/2017 1057   KETONESUR NEGATIVE 05/08/2017 1057   PROTEINUR NEGATIVE 05/08/2017 1057   NITRITE NEGATIVE 05/08/2017 1057   LEUKOCYTESUR MODERATE (A) 05/08/2017 1057   Sepsis Labs Invalid  input(s): PROCALCITONIN,  WBC,  LACTICIDVEN Microbiology Recent Results (from the past 240 hour(s))  SARS CORONAVIRUS 2 (TAT 6-24 HRS) Nasopharyngeal Nasopharyngeal Swab     Status: None   Collection Time: 05/10/19  2:36 PM   Specimen: Nasopharyngeal Swab  Result Value Ref Range Status   SARS Coronavirus 2 NEGATIVE NEGATIVE Final    Comment: (NOTE) SARS-CoV-2 target nucleic acids are NOT DETECTED. The SARS-CoV-2 RNA is generally detectable in upper and lower respiratory specimens during the acute phase of infection. Negative results do not preclude SARS-CoV-2 infection, do not rule out co-infections with other pathogens, and should not be used as the sole basis for treatment or other patient management decisions. Negative results must be combined with clinical observations, patient history, and epidemiological information. The expected result is Negative. Fact Sheet for Patients: SugarRoll.be Fact Sheet for Healthcare Providers: https://www.woods-mathews.com/ This test is not yet approved or cleared by the [place] FDA and  has been authorized for detection and/or diagnosis of SARS-CoV-2 by FDA under an Emergency Use Authorization (EUA). This EUA will remain  in effect (meaning this test can be used) for the duration of the COVID-19 declaration under Section 56 4(b)(1) of the Act, 21 U.S.C. section 360bbb-3(b)(1), unless the authorization is terminated or revoked sooner. Performed at [facility], [place] [address] [place], [place] [address]      Time coordinating discharge: Over 30 minutes  SIGNED:   Nolberto Hanlon, MD  [facility] 05/15/2019, 10:45 AM Pager   If 7PM-7AM, please contact night-coverage [email or web address] Password [email or web address]

## 2019-05-15 NOTE — ED Notes (Signed)
Dr. Jessup at the bedside 

## 2019-05-15 NOTE — ED Notes (Signed)
Noel at bedside attempting [place] IV at this time.

## 2019-05-15 NOTE — ED Notes (Signed)
Patient transferred to ct.

## 2019-05-15 NOTE — ED Notes (Signed)
Unsuccessful IV attempt by Ramond Dial, April RN and this RN.

## 2019-05-15 NOTE — H&P (Signed)
History and Physical    Theresa Cain [id number] DOB: 06-Jul-1926 DOA: 05/15/2019  PCP: Kendrick Ranch, MD   Patient coming from: home  I have personally briefly reviewed patient's old medical records in [place]  Chief Complaint: right arm weakness  HPI: Theresa Cain is a 86 y.o. 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  She states when she got home she tried to get her cane and realize she had trouble holding it.  She feels good strength in her arm except for the wrist and hand.  She returned to the emergency room arriving to the ER outside the TPA window.  Denies weakness numbness tingling of the lower extremity or face.  Denied headache or visual disturbance.  Denied chest pain, shortness of breath fever or chills. ED Course: On arrival to the emergency room, vitals were within normal limits.  At work revealed a hemoglobin of 7.8, which is up from discharge hemoglobin of 7.4 earlier in the day.  Creatinine 1.64 around her baseline for 1.41 earlier.  Troponin 24.  Head CT showed no acute intracranial findings.  EKG normal sinus rhythm.  Patient arrived outside TPA window.  Aspirin was not administered due to recent  bleed.  Hospitalist consulted for admission.  Review of Systems: As per HPI otherwise 10 point review of systems negative.    Past Medical History:  Diagnosis Date   Acute on chronic diastolic CHF (congestive heart failure) (HCC) 02/22/2016   Anemia    Bradycardia    Carotid artery occlusion    Chronic diastolic CHF (congestive heart failure) (HCC)    Chronic kidney disease    elevated creatinne   Coronary artery disease involving native coronary artery of native heart    GERD (gastroesophageal reflux disease)    HLD (hyperlipidemia)    Hypertension    Hypothyroidism    Obesity    Osteoarthritis    Permanent atrial fibrillation ([place])    Presence of permanent cardiac pacemaker    PVD (peripheral vascular disease) (HCC)    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   23 mm Edwards Sapien 3 transcatheter heart valve placed via percutaneous right transfemoral approach    Severe aortic stenosis    Stomach cancer ([place]) ~ 2013   Vertigo     Past Surgical History:  Procedure Laterality Date   CARDIOVASCULAR STRESS TEST     CARDIOVERSION N/A 08/08/2016   Procedure: CARDIOVERSION;  Surgeon: Sueanne Margarita, MD;  Location: [facility];  Service: Cardiovascular;  Laterality: N/A;   CAROTID ENDARTERECTOMY Right ~ 2005   CATARACT EXTRACTION W/ INTRAOCULAR LENS IMPLANT Right    CHOLECYSTECTOMY  ~ 2013   "part of her [facility] OR"   EP IMPLANTABLE DEVICE N/A 03/04/2016   Procedure: Pacemaker Implant;  Surgeon: Will Meredith Leeds, MD;  Location: [facility];  Service: Cardiovascular;  Laterality: N/A;   EP IMPLANTABLE DEVICE N/A 03/05/2016   Procedure: Lead Revision/Repair;  Surgeon: Deboraha Sprang, MD;  Location: [facility];  Service: Cardiovascular;  Laterality: N/A;   INSERT / REPLACE / REMOVE PACEMAKER     LEFT HEART CATH AND CORONARY ANGIOGRAPHY N/A 03/27/2017   Procedure: LEFT HEART CATH AND CORONARY ANGIOGRAPHY;  Surgeon:  Minna Merritts, MD;  Location: [facility];  Service: Cardiovascular;  Laterality: N/A;   TEE WITHOUT CARDIOVERSION N/A 05/12/2017   Procedure: TRANSESOPHAGEAL ECHOCARDIOGRAM (TEE);  Surgeon: Sherren Mocha, MD;  Location: [facility];  Service: Open Heart Surgery;  Laterality: N/A;   TRANSCATHETER AORTIC VALVE REPLACEMENT, TRANSFEMORAL N/A 05/12/2017   Procedure: TRANSCATHETER AORTIC VALVE REPLACEMENT, TRANSFEMORAL using a 66mm Edwards Sapien 3 Aortic Valve;  Surgeon: Sherren Mocha, MD;  Location: [facility];  Service: Open Heart Surgery;  Laterality: N/A;   US ECHOCARDIOGRAPHY     VAGINAL HYSTERECTOMY     WHIPPLE PROCEDURE  ~ 2013     reports that she has never smoked. She has never used smokeless tobacco. She reports that she does not drink alcohol or use drugs.  Allergies  Allergen Reactions   Penicillins Hives and Other (See Comments)    PATIENT HAS HAD A PCN REACTION WITH IMMEDIATE RASH, FACIAL/TONGUE/THROAT SWELLING, SOB, OR LIGHTHEADEDNESS WITH HYPOTENSION:  #  #  #  YES  #  #  #   Has patient had a PCN reaction causing severe rash involving mucus membranes or skin necrosis: No Has patient had a PCN reaction that required hospitalization: No Has patient had a PCN reaction occurring within the last 10 years: No If all of the above answers are "NO", then may proceed with Cephalosporin use.    Sulfa Antibiotics Hives   Cymbalta [Duloxetine Hcl]     Sweat, feels like having a heart attack     Family History  Problem Relation Age of Onset   Heart disease Sister      Prior to Admission medications   Medication Sig Start Date End Date Taking? Authorizing Provider  acetaminophen (TYLENOL) 500 MG tablet Take 1,000 mg by mouth every 6 (six) hours as needed for moderate pain.    [provider]  diltiazem (CARDIZEM CD) 180 MG 24 hr capsule Take 1 capsule (180 mg total) by mouth daily. 03/27/17   Vaughan Basta, MD  escitalopram (LEXAPRO) 10 MG tablet Take 10  mg by mouth at bedtime.     [provider]  ferrous sulfate 325 (65 FE) MG tablet Take 325 mg by mouth daily with breakfast.    [provider]  levothyroxine (SYNTHROID) 50 MCG tablet Take 1 tablet (50 mcg total) by mouth daily. 03/27/17 05/10/19  Vaughan Basta, MD  omeprazole (PRILOSEC) 40 MG capsule Take 1 capsule (40 mg total) by mouth 2 (two) times daily before a meal. 05/15/19   Nolberto Hanlon, MD  pravastatin (PRAVACHOL) 40 MG tablet Take 40 mg by mouth every evening.     [provider]  Vitamin D, Ergocalciferol, (DRISDOL) 50000 units CAPS capsule Take 50,000 Units by mouth 2 (two) times a week. Take on Sunday and Thursday    [provider]    Physical Exam: Vitals:   05/15/19 2015 05/15/19 2100 05/15/19 2130 05/15/19 2200  BP:   109/68 (!) 145/57  Pulse: (!) 59 66 60 63  Resp: 10 11 12  (!) 22  Temp:      TempSrc:  SpO2: 97% 99% 98% 97%  Weight:      Height:         Vitals:   05/15/19 2015 05/15/19 2100 05/15/19 2130 05/15/19 2200  BP:   109/68 (!) 145/57  Pulse: (!) 59 66 60 63  Resp: 10 11 12  (!) 22  Temp:      TempSrc:      SpO2: 97% 99% 98% 97%  Weight:      Height:        Constitutional: NAD, alert and oriented x 3 Eyes: PERRL, lids and conjunctivae normal ENMT: Mucous membranes are moist.  Neck: normal, supple, no masses, no thyromegaly Respiratory: clear to auscultation bilaterally, no wheezing, no crackles. Normal respiratory effort. No accessory muscle use.  Cardiovascular: Regular rate and rhythm, no murmurs / rubs / gallops. No extremity edema. 2+ pedal pulses. No carotid bruits.  Abdomen: no tenderness, no masses palpated. No hepatosplenomegaly. Bowel sounds positive.  Musculoskeletal: no clubbing / cyanosis. No joint deformity upper and lower extremities.  Skin: no rashes, lesions, ulcers.  Neurologic: Weakness of left hand, with weak dorsiflexion.  Preserved grip.  Preserved strength upper arm.  Equal  strength lower extremities.  Cranial nerves II through X intact Psychiatric: Normal mood and affect.   Labs on Admission: I have personally reviewed following labs and imaging studies  CBC: Recent Labs  Lab 05/12/19 0540 05/12/19 0540 05/12/19 0845 05/12/19 1628 05/13/19 0902 05/13/19 1819 05/14/19 0804 05/15/19 0347 05/15/19 2103  WBC 5.6  --  6.1  --   --   --  4.8 4.1 6.7  HGB 7.2*   < > 7.6*   < > 7.5* 7.4* 7.3* 7.3* 7.8*  HCT 22.9*   < > 24.6*   < > 24.2* 24.4* 23.6* 23.6* 25.2*  MCV 91.2  --  90.4  --   --   --  89.7 89.1 89.0  PLT 134*  --  145*  --   --   --  143* 160 216   < > = values in this interval not displayed.   Basic Metabolic Panel: Recent Labs  Lab 05/10/19 1156 05/11/19 0200 05/12/19 0205 05/15/19 0347 05/15/19 2103  NA 140 141 138 138 139  K 4.1 3.9 3.7 3.4* 4.0  CL 108 109 105 104 104  CO2 23 24 25 27 25   GLUCOSE 148* 150* 135* 140* 165*  BUN 22 19 19 10 14   CREATININE 1.34* 1.19* 1.31* 1.41* 1.64*  CALCIUM 8.8* 8.3* 8.0* 8.2* 8.9   GFR: Estimated Creatinine Clearance: 26.3 mL/min (A) (by C-G formula based on SCr of 1.64 mg/dL (H)). Liver Function Tests: Recent Labs  Lab 05/10/19 1156 05/15/19 2103  AST 17 14*  ALT 10 8  ALKPHOS 53 59  BILITOT 0.5 0.6  PROT 6.4* 6.5  ALBUMIN 3.6 3.4*   No results for input(s): LIPASE, AMYLASE in the last 168 hours. No results for input(s): AMMONIA in the last 168 hours. Coagulation Profile: Recent Labs  Lab 05/10/19 1156 05/15/19 2103  INR 1.4* 1.1   Cardiac Enzymes: No results for input(s): CKTOTAL, CKMB, CKMBINDEX, TROPONINI in the last 168 hours. BNP (last 3 results) No results for input(s): PROBNP in the last 8760 hours. HbA1C: No results for input(s): HGBA1C in the last 72 hours. CBG: Recent Labs  Lab 05/11/19 1155 05/11/19 1637 05/12/19 0818 05/13/19 0740 05/14/19 0934  GLUCAP 127* 129* 125* 135* 175*   Lipid Profile: No results for input(s): CHOL, HDL, LDLCALC, TRIG,  CHOLHDL, LDLDIRECT  in the last 72 hours. Thyroid Function Tests: No results for input(s): TSH, T4TOTAL, FREET4, T3FREE, THYROIDAB in the last 72 hours. Anemia Panel: No results for input(s): VITAMINB12, FOLATE, FERRITIN, TIBC, IRON, RETICCTPCT in the last 72 hours. Urine analysis:    Component Value Date/Time   COLORURINE YELLOW 05/08/2017 1057   APPEARANCEUR CLOUDY (A) 05/08/2017 1057   LABSPEC 1.021 05/08/2017 1057   PHURINE 5.0 05/08/2017 1057   GLUCOSEU NEGATIVE 05/08/2017 1057   HGBUR NEGATIVE 05/08/2017 1057   BILIRUBINUR NEGATIVE 05/08/2017 1057   KETONESUR NEGATIVE 05/08/2017 1057   PROTEINUR NEGATIVE 05/08/2017 1057   NITRITE NEGATIVE 05/08/2017 1057   LEUKOCYTESUR MODERATE (A) 05/08/2017 1057    Radiological Exams on Admission: CT Head Wo Contrast  Result Date: 05/15/2019 CLINICAL DATA:  Weakness EXAM: CT HEAD WITHOUT CONTRAST TECHNIQUE: Contiguous axial images were obtained from the base of the skull through the vertex without intravenous contrast. COMPARISON:  None. FINDINGS: Brain: There is no mass, hemorrhage or extra-axial collection. The size and configuration of the ventricles and extra-axial CSF spaces are normal. There is hypoattenuation of the white matter, most commonly indicating chronic small vessel disease. Vascular: Atherosclerotic calcification of the internal carotid arteries at the skull base. No abnormal hyperdensity of the major intracranial arteries or dural venous sinuses. Skull: The visualized skull base, calvarium and extracranial soft tissues are normal. Sinuses/Orbits: No fluid levels or advanced mucosal thickening of the visualized paranasal sinuses. No mastoid or middle ear effusion. The orbits are normal. IMPRESSION: Chronic small vessel disease without acute intracranial abnormality. Electronically Signed   By: Ulyses Jarred M.D.   On: 05/15/2019 20:39    EKG: Independently reviewed.   Assessment/Plan Principal Problem: Weakness right  hand Suspect acute CVA  -Patient presenting with right hand weakness with wrist drop with otherwise no neurologic deficits. -CT head negative for acute intracranial process -Follow-up MRI. -Not a candidate for TPA because arrived outside TPA window. -Saved aspirin given stable hemoglobin Continue pravastatin  -Permissive hypertension first 24 hours -Neurology consult -Stroke work-up with continuous cardiac monitoring, echocardiogram and carotid Dopplers -PT OT and speech therapy evaluations    Anemia due to GI blood loss   Gastric tumor on EGD 05/13/2019, with history of stomach cancer 2013 -Patient recently admitted with GI bleeding, transfused for hemoglobin 6.6 -Hemoglobin stable at 7.8 -Continue ferrous sulfate, continue omeprazole -Continue to monitor closely -Plan per oncology note was to follow-up absolute plans for referral to [facility] if evidence of neuroendocrine tumor    Chronic diastolic CHF (congestive heart failure) ([place]) -Patient euvolemic at this time -Continue diltiazem    HTN (hypertension) -Continue diltiazem as above    Permanent atrial fibrillation (HCC) -Eliquis has been discontinued due to recent GI bleed -Continue diltiazem    Coronary artery disease involving native coronary artery of native heart   History of TAVR (transcatheter aortic valve replacement) -No apparent acute issues -To new pravastatin.  Not currently on beta-blocker.    Hypothyroidism -Continue levothyroxine    CKD (chronic kidney disease), stage IIIa -Creatinine close to baseline.  Continue to monitor      DVT prophylaxis: SCD due to recent GI bleed requiring blood transfusion  Code Status: full code  Family Communication: none  Disposition Plan: Back to previous home environment Consults called: neurology, Dr Doy Mince    Athena Masse MD [facility]     05/15/2019, 11:13 PM

## 2019-05-16 ENCOUNTER — Telehealth: Payer: Self-pay

## 2019-05-16 ENCOUNTER — Telehealth: Payer: Self-pay | Admitting: Cardiovascular Disease

## 2019-05-16 ENCOUNTER — Observation Stay: Payer: Medicare Other

## 2019-05-16 ENCOUNTER — Other Ambulatory Visit: Payer: Self-pay | Admitting: Oncology

## 2019-05-16 ENCOUNTER — Encounter: Payer: Self-pay | Admitting: *Deleted

## 2019-05-16 DIAGNOSIS — R29700 NIHSS score 0: Secondary | ICD-10-CM | POA: Diagnosis present

## 2019-05-16 DIAGNOSIS — Z9049 Acquired absence of other specified parts of digestive tract: Secondary | ICD-10-CM | POA: Diagnosis not present

## 2019-05-16 DIAGNOSIS — M21331 Wrist drop, right wrist: Secondary | ICD-10-CM | POA: Diagnosis present

## 2019-05-16 DIAGNOSIS — R29818 Other symptoms and signs involving the nervous system: Secondary | ICD-10-CM | POA: Diagnosis not present

## 2019-05-16 DIAGNOSIS — D3A8 Other benign neuroendocrine tumors: Secondary | ICD-10-CM | POA: Diagnosis present

## 2019-05-16 DIAGNOSIS — Z8249 Family history of ischemic heart disease and other diseases of the circulatory system: Secondary | ICD-10-CM | POA: Diagnosis not present

## 2019-05-16 DIAGNOSIS — I4821 Permanent atrial fibrillation: Secondary | ICD-10-CM | POA: Diagnosis present

## 2019-05-16 DIAGNOSIS — I639 Cerebral infarction, unspecified: Secondary | ICD-10-CM | POA: Diagnosis present

## 2019-05-16 DIAGNOSIS — Z950 Presence of cardiac pacemaker: Secondary | ICD-10-CM | POA: Diagnosis not present

## 2019-05-16 DIAGNOSIS — N1831 Chronic kidney disease, stage 3a: Secondary | ICD-10-CM | POA: Diagnosis present

## 2019-05-16 DIAGNOSIS — I739 Peripheral vascular disease, unspecified: Secondary | ICD-10-CM | POA: Diagnosis present

## 2019-05-16 DIAGNOSIS — Z888 Allergy status to other drugs, medicaments and biological substances status: Secondary | ICD-10-CM | POA: Diagnosis not present

## 2019-05-16 DIAGNOSIS — Z880 Allergy status to penicillin: Secondary | ICD-10-CM | POA: Diagnosis not present

## 2019-05-16 DIAGNOSIS — I5032 Chronic diastolic (congestive) heart failure: Secondary | ICD-10-CM | POA: Diagnosis present

## 2019-05-16 DIAGNOSIS — M6281 Muscle weakness (generalized): Secondary | ICD-10-CM | POA: Diagnosis not present

## 2019-05-16 DIAGNOSIS — I6389 Other cerebral infarction: Secondary | ICD-10-CM | POA: Diagnosis not present

## 2019-05-16 DIAGNOSIS — G5631 Lesion of radial nerve, right upper limb: Secondary | ICD-10-CM | POA: Diagnosis present

## 2019-05-16 DIAGNOSIS — I130 Hypertensive heart and chronic kidney disease with heart failure and stage 1 through stage 4 chronic kidney disease, or unspecified chronic kidney disease: Secondary | ICD-10-CM | POA: Diagnosis present

## 2019-05-16 DIAGNOSIS — C169 Malignant neoplasm of stomach, unspecified: Secondary | ICD-10-CM | POA: Diagnosis present

## 2019-05-16 DIAGNOSIS — Z20822 Contact with and (suspected) exposure to covid-19: Secondary | ICD-10-CM | POA: Diagnosis present

## 2019-05-16 DIAGNOSIS — K921 Melena: Secondary | ICD-10-CM | POA: Diagnosis present

## 2019-05-16 DIAGNOSIS — E039 Hypothyroidism, unspecified: Secondary | ICD-10-CM | POA: Diagnosis present

## 2019-05-16 DIAGNOSIS — E785 Hyperlipidemia, unspecified: Secondary | ICD-10-CM | POA: Diagnosis present

## 2019-05-16 DIAGNOSIS — Z953 Presence of xenogenic heart valve: Secondary | ICD-10-CM | POA: Diagnosis not present

## 2019-05-16 DIAGNOSIS — D500 Iron deficiency anemia secondary to blood loss (chronic): Secondary | ICD-10-CM | POA: Diagnosis present

## 2019-05-16 DIAGNOSIS — R2990 Unspecified symptoms and signs involving the nervous system: Secondary | ICD-10-CM | POA: Diagnosis present

## 2019-05-16 DIAGNOSIS — I2510 Atherosclerotic heart disease of native coronary artery without angina pectoris: Secondary | ICD-10-CM | POA: Diagnosis present

## 2019-05-16 DIAGNOSIS — Z882 Allergy status to sulfonamides status: Secondary | ICD-10-CM | POA: Diagnosis not present

## 2019-05-16 DIAGNOSIS — D509 Iron deficiency anemia, unspecified: Secondary | ICD-10-CM

## 2019-05-16 DIAGNOSIS — K219 Gastro-esophageal reflux disease without esophagitis: Secondary | ICD-10-CM | POA: Diagnosis present

## 2019-05-16 LAB — URINALYSIS, COMPLETE (UACMP) WITH MICROSCOPIC
Bilirubin Urine: NEGATIVE
Glucose, UA: NEGATIVE mg/dL
Hgb urine dipstick: NEGATIVE
Ketones, ur: NEGATIVE mg/dL
Nitrite: NEGATIVE
Protein, ur: NEGATIVE mg/dL
Specific Gravity, Urine: 1.009 (ref 1.005–1.030)
pH: 5 (ref 5.0–8.0)

## 2019-05-16 LAB — LIPID PANEL
Cholesterol: 121 mg/dL (ref 0–200)
HDL: 41 mg/dL (ref >40)
LDL Cholesterol: 58 mg/dL (ref 0–99)
Total CHOL/HDL Ratio: 3 RATIO
Triglycerides: 112 mg/dL (ref <150)
VLDL: 22 mg/dL (ref 0–40)

## 2019-05-16 LAB — TROPONIN I (HIGH SENSITIVITY): Troponin I (High Sensitivity): 25 ng/L — ABNORMAL HIGH (ref <18)

## 2019-05-16 LAB — HEMOGLOBIN A1C
Hgb A1c MFr Bld: 4.5 % — ABNORMAL LOW (ref 4.8–5.6)
Mean Plasma Glucose: 82.45 mg/dL

## 2019-05-16 LAB — SARS CORONAVIRUS 2 (TAT 6-24 HRS): SARS Coronavirus 2: NEGATIVE

## 2019-05-16 NOTE — Telephone Encounter (Signed)
Theresa Cain from [facility] calling Due to a lesion in stomach Dr Janese Banks would like to know if it patient can stop blood thinners for 1-2 weeks while awaiting other testing  Please call to discuss at [phone]

## 2019-05-16 NOTE — Telephone Encounter (Signed)
Theresa Cain- please get in touch with ehr cardiologist Dr. Candis Musa and let him know if its ok to keep her off blood thinner for 1-2 weeks. she had a bleeding lesion in her stomach and underwent EGD. pathology is pending. until we figure out if it will be surgery or radiation, I would like her off blood thinner if possible. Thanks. Called Dr. Donivan Scull office and asked if patient could be off her blood thinners for 1-2 weeks. I was told by the triage nurse that she would send a message to Dr. Rockey Situ and that she will get back to me once she had a response.

## 2019-05-16 NOTE — Consult Note (Signed)
Requesting Physician: Dr. Billie Ruddy    Chief Complaint: Right hand weakness  History obtained from: Patient and Chart  HPI:                                                                                                                                       Theresa Cain is a 86 y.o. 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  Patient admitted from 05/10/2019 - 05/15/19 with severe anemia requiring transfusion. EGD showing circumferential mass.  Patient was discharged after discontinuing aspirin and Eliquis.  On the following day after discharge, reclined on the chair.  When she woke up she noted that she could not move her wrist or hand and presented to the emergency room.  Work-up included CT head which was unremarkable, hemoglobin was 7.4.  EKG in sinus rhythm.  Patient outside the TPA window on arrival.  Admitted to hospitalist for further evaluation and neurology was consulted.  Currently she states her right hand strength is improved by approximately 90% but continues to remain weak compared to the left.   Date last known well: 05/15/2019 tPA Given: No, outside window NIHSS: 0 Baseline MRS 0    Past Medical History:  Diagnosis Date  . Acute on chronic diastolic CHF (congestive heart failure) ([place]) 02/22/2016  . Anemia   . Bradycardia   . Carotid artery occlusion   . Chronic diastolic CHF (congestive heart failure) ([place])   . Chronic kidney disease    elevated creatinne  . Coronary artery disease involving native coronary artery of native heart   . GERD (gastroesophageal reflux disease)   . HLD (hyperlipidemia)   . Hypertension   . Hypothyroidism   .  Obesity   . Osteoarthritis   . Permanent atrial fibrillation ([place])   . Presence of permanent cardiac pacemaker   . PVD (peripheral vascular disease) ([place])   . 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 Severe aortic stenosis   . Stomach cancer ([place]) ~ 2013  . Vertigo     Past Surgical History:  Procedure Laterality Date  . CARDIOVASCULAR STRESS TEST    . CARDIOVERSION N/A 08/08/2016   Procedure: CARDIOVERSION;  Surgeon: Sueanne Margarita, MD;  Location: [facility] ENDOSCOPY;  Service: Cardiovascular;  Laterality: N/A;  . CAROTID ENDARTERECTOMY Right ~ 2005  . CATARACT EXTRACTION W/ INTRAOCULAR LENS IMPLANT Right   . CHOLECYSTECTOMY  ~ 2013   "part of her [place] OR"  . EP IMPLANTABLE DEVICE N/A 03/04/2016   Procedure: Pacemaker Implant;  Surgeon: Will Meredith Leeds, MD;  Location: [facility]  INVASIVE CV LAB;  Service: Cardiovascular;  Laterality: N/A;  . EP IMPLANTABLE DEVICE N/A 03/05/2016   Procedure: Lead Revision/Repair;  Surgeon: Deboraha Sprang, MD;  Location: [place] CV LAB;  Service: Cardiovascular;  Laterality: N/A;  . ESOPHAGOGASTRODUODENOSCOPY (EGD) WITH PROPOFOL N/A 05/13/2019   Procedure: ESOPHAGOGASTRODUODENOSCOPY (EGD) WITH PROPOFOL;  Surgeon: Jonathon Bellows, MD;  Location[facility] ENDOSCOPY;  Service: Gastroenterology;  Laterality: N/A;  . INSERT / REPLACE / REMOVE PACEMAKER    . LEFT HEART CATH AND CORONARY ANGIOGRAPHY N/A 03/27/2017   Procedure: LEFT HEART CATH AND CORONARY ANGIOGRAPHY;  Surgeon: Minna Merritts, MD;  Location: [place] CV LAB;  Service: Cardiovascular;  Laterality: N/A;  . TEE WITHOUT CARDIOVERSION N/A 05/12/2017   Procedure: TRANSESOPHAGEAL ECHOCARDIOGRAM (TEE);  Surgeon: Sherren Mocha, MD;  Location: [place];  Service: Open Heart Surgery;  Laterality: N/A;  . TRANSCATHETER AORTIC VALVE REPLACEMENT, TRANSFEMORAL N/A 05/12/2017   Procedure:  TRANSCATHETER AORTIC VALVE REPLACEMENT, TRANSFEMORAL using a 61mm Edwards Sapien 3 Aortic Valve;  Surgeon: Sherren Mocha, MD;  Location: [place];  Service: Open Heart Surgery;  Laterality: N/A;  . US ECHOCARDIOGRAPHY    . VAGINAL HYSTERECTOMY    . WHIPPLE PROCEDURE  ~ 2013    Family History  Problem Relation Age of Onset  . Heart disease Sister    Social History:  reports that she has never smoked. She has never used smokeless tobacco. She reports that she does not drink alcohol or use drugs.  Allergies:  Allergies  Allergen Reactions  . Penicillins Hives and Other (See Comments)    PATIENT HAS HAD A PCN REACTION WITH IMMEDIATE RASH, FACIAL/TONGUE/THROAT SWELLING, SOB, OR LIGHTHEADEDNESS WITH HYPOTENSION:  #  #  #  YES  #  #  #   Has patient had a PCN reaction causing severe rash involving mucus membranes or skin necrosis: No Has patient had a PCN reaction that required hospitalization: No Has patient had a PCN reaction occurring within the last 10 years: No If all of the above answers are "NO", then may proceed with Cephalosporin use.   . Sulfa Antibiotics Hives  . Cymbalta [Duloxetine Hcl]     Sweat, feels like having a heart attack     Medications:                                                                                                                        I reviewed home medications   ROS:  14 systems reviewed and negative except above   Examination:                                                                                                      General: Appears well-developed  Psych: Affect appropriate to situation Eyes: No scleral injection HENT: No OP obstrucion Head: Normocephalic.  Cardiovascular: Normal rate and regular rhythm. Respiratory: Effort normal and breath sounds normal to anterior ascultation GI: Soft.  No  distension. There is no tenderness.  Skin: WDI    Neurological Examination Mental Status: Alert, oriented, thought content appropriate.  Speech fluent without evidence of aphasia. Able to follow 3 step commands without difficulty. Cranial Nerves: II: Visual fields grossly normal,  III,IV, VI: ptosis not present, extra-ocular motions intact bilaterally, pupils equal, round, reactive to light and accommodation V,VII: smile symmetric, facial light touch sensation normal bilaterally VIII: hearing normal bilaterally IX,X: uvula rises symmetrically XI: bilateral shoulder shrug XII: midline tongue extension Motor: Right : Upper extremity, reduced hand grip, wrist extension in the right arm.    Left:     Upper extremity   5/5  Lower extremity   5/5     Lower extremity   5/5 Tone and bulk:normal tone throughout; no atrophy noted Sensory: Pinprick and light touch intact throughout, bilaterally Deep Tendon Reflexes: 2+ and symmetric throughout Plantars: Right: downgoing   Left: downgoing Cerebellar: normal finger-to-nose, normal rapid alternating movements and normal heel-to-shin test Gait: normal gait and station     Lab Results: Basic Metabolic Panel: Recent Labs  Lab 05/10/19 1156 05/10/19 1156 05/11/19 0200 05/11/19 0200 05/12/19 0205 05/15/19 0347 05/15/19 2103  NA 140  --  141  --  138 138 139  K 4.1  --  3.9  --  3.7 3.4* 4.0  CL 108  --  109  --  105 104 104  CO2 23  --  24  --  25 27 25   GLUCOSE 148*  --  150*  --  135* 140* 165*  BUN 22  --  19  --  19 10 14   CREATININE 1.34*  --  1.19*  --  1.31* 1.41* 1.64*  CALCIUM 8.8*   < > 8.3*   < > 8.0* 8.2* 8.9   < > = values in this interval not displayed.    CBC: Recent Labs  Lab 05/12/19 0540 05/12/19 0540 05/12/19 0845 05/12/19 1628 05/13/19 0902 05/13/19 1819 05/14/19 0804 05/15/19 0347 05/15/19 2103  WBC 5.6  --  6.1  --   --   --  4.8 4.1 6.7  HGB 7.2*   < > 7.6*   < > 7.5* 7.4* 7.3* 7.3* 7.8*  HCT 22.9*    < > 24.6*   < > 24.2* 24.4* 23.6* 23.6* 25.2*  MCV 91.2  --  90.4  --   --   --  89.7 89.1 89.0  PLT 134*  --  145*  --   --   --  143* 160 216   < > = values in this interval not displayed.  Coagulation Studies: Recent Labs    05/15/19 07-12-2101  LABPROT 14.5  INR 1.1    Imaging: CT Head Wo Contrast  Result Date: 05/15/2019 CLINICAL DATA:  Weakness EXAM: CT HEAD WITHOUT CONTRAST TECHNIQUE: Contiguous axial images were obtained from the base of the skull through the vertex without intravenous contrast. COMPARISON:  None. FINDINGS: Brain: There is no mass, hemorrhage or extra-axial collection. The size and configuration of the ventricles and extra-axial CSF spaces are normal. There is hypoattenuation of the white matter, most commonly indicating chronic small vessel disease. Vascular: Atherosclerotic calcification of the internal carotid arteries at the skull base. No abnormal hyperdensity of the major intracranial arteries or dural venous sinuses. Skull: The visualized skull base, calvarium and extracranial soft tissues are normal. Sinuses/Orbits: No fluid levels or advanced mucosal thickening of the visualized paranasal sinuses. No mastoid or middle ear effusion. The orbits are normal. IMPRESSION: Chronic small vessel disease without acute intracranial abnormality. Electronically Signed   By: Ulyses Jarred M.D.   On: 05/15/2019 20:39   US Carotid Bilateral (at [facility] and [facility] only)  Result Date: 05/16/2019 CLINICAL DATA:  86 year old female with a history of stroke EXAM: BILATERAL CAROTID DUPLEX ULTRASOUND TECHNIQUE: Pearline Cables scale imaging, color Doppler and duplex ultrasound were performed of bilateral carotid and vertebral arteries in the neck. COMPARISON:  None. FINDINGS: Criteria: Quantification of carotid stenosis is based on velocity parameters that correlate the residual internal carotid diameter with NASCET-based stenosis levels, using the diameter of the distal internal carotid lumen as the  denominator for stenosis measurement. The following velocity measurements were obtained: RIGHT ICA:  Systolic Q000111Q cm/sec, Diastolic 21 cm/sec CCA:  [id number] cm/sec SYSTOLIC ICA/CCA RATIO:  0.8 ECA:  184 cm/sec LEFT ICA: N/a CCA:  99991111 cm/sec SYSTOLIC ICA/CCA RATIO:  N/a ECA:  298 cm/sec Right Brachial SBP: Not acquired Left Brachial SBP: Not acquired RIGHT CAROTID ARTERY: Calcification of the right common carotid artery. Intermediate waveform maintained. Moderate heterogeneous and partially calcified plaque at the right carotid bifurcation. Mild shadowing. Low resistance waveform of the right ICA. No significant tortuosity. RIGHT VERTEBRAL ARTERY: Antegrade flow with low resistance waveform. LEFT CAROTID ARTERY: Calcification of the left common carotid artery. High resistance waveform maintained of the CCA. Occlusion of the ICA at the bifurcation. LEFT VERTEBRAL ARTERY:  Antegrade flow with low resistance waveform. IMPRESSION: Right: Color duplex indicates moderate heterogeneous and calcified plaque, with no hemodynamically significant stenosis by duplex criteria in the extracranial cerebrovascular circulation. 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 Signed, Dulcy Fanny. Dellia Nims, RPVI [facility] Radiology Electronically Signed   By: Corrie Mckusick D.O.   On: 05/16/2019 12:35     ASSESSMENT AND PLAN  86 y.o. 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  On assessment, patient either has wrist drop due to Saturday night palsy-which is now improving, although she could have a " cortical hand" deficit due to a  embolic stroke in the motor strip of the left frontal cortex.  Unfortunately cannot get MRI brain, recommend repeating CT head tomorrow which would increase sensitivity of detecting stroke compared to initial CT.   I am concerned over recurrent stroke risks as she is not on anticoagulation.  Her GI bleeding  is severe and therefore we have no choice but to continue holding it.  Consider reconsulting GI tomorrow to see if there is any role in resection/embolization.  She is certainly between a rock and a hard place.  Another option would be a left atrial appendage closure procedure by cardiology, however given her age and risk factor profile-I doubt she would be a candidate and family also does not seem to be interested.    Acute ischemic stroke vs radial nerve palsy Atrial fibrillation not on anticoagulation due to GI bleed  Recommendations -Repeat CT head tomorrow -If possible, restart aspirin.  Ideally will need to be on anticoagulation given her high chads score -Frequent neurochecks -Continue statin -BP less than 140/90 mmHg -Carotid Dopplers -Stroke swallow screen -PT/OT  Neurology will continue to follow  [place] Pager Number [phone]

## 2019-05-16 NOTE — Progress Notes (Signed)
PROGRESS NOTE    Theresa Cain  [id number] DOB: 06-19-1926 DOA: 05/15/2019 PCP: Kendrick Ranch, MD    Assessment & Plan:   Principal Problem:   Acute focal neurological deficit, onset within 3-24 hours Active Problems:   Chronic diastolic CHF (congestive heart failure) (HCC)   HTN (hypertension)   Permanent atrial fibrillation (HCC)   Coronary artery disease involving native coronary artery of native heart   S/P TAVR (transcatheter aortic valve replacement)   Hypothyroidism   Anemia due to GI blood loss   CKD (chronic kidney disease), stage IIIa   Gastric tumor   Stroke-like symptom    Theresa Cain is a 86 y.o. 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      Weakness right hand, improved Suspect acute CVA  -Patient presenting with right hand weakness with wrist drop with otherwise no neurologic deficits.  Not a candidate for TPA because arrived outside TPA window. -CT head negative for acute intracranial process -MRI can not be performed due to presence of pacemaker PLAN:  Continue pravastatin  -Permissive hypertension first 24 hours -Neurology consult. rec repeat CT head tomorrow -Stroke work-up  with continuous cardiac monitoring, echocardiogram and carotid Dopplers -PT OT and speech therapy evaluations    Anemia due to GI blood loss   Gastric tumor on EGD 05/13/2019, with history of stomach cancer 2013 -Patient recently admitted with GI bleeding, transfused for hemoglobin 6.6 -Hemoglobin stable at 7.8 on presentation PLAN: -Continue ferrous sulfate, continue omeprazole -Continue to monitor closely -Plan per oncology note was to follow-up absolute plans for referral to [facility] if evidence of neuroendocrine tumor --Hold anticoagulation    Chronic diastolic CHF (congestive heart failure) ([place]) -Patient euvolemic at this time -Continue diltiazem    HTN (hypertension) -Continue diltiazem as above    Permanent atrial fibrillation (HCC) -Eliquis has been discontinued due to recent GI bleed -Continue diltiazem    Coronary artery disease involving native coronary artery of native heart   History of TAVR (transcatheter aortic valve replacement) -No apparent acute issues -To new pravastatin.  Not currently on beta-blocker.    Hypothyroidism -Continue levothyroxine    CKD (chronic kidney disease), stage IIIa -Creatinine close to baseline.  Continue to monitor    DVT prophylaxis: SCD/Compression stockings Code Status: Full code  Family Communication: daughter updated at bedside Disposition Plan: Home tomorrow   Subjective and Interval History:  Pt reported her right hand and wrist weakness had improved, and she could use it again.  No fever, dyspnea, chest pain, abdominal pain, N/V/D, dysuria.   Objective: Vitals:   05/16/19 0610 05/16/19 0756 05/16/19 1158 05/16/19 1606  BP: (!) 126/53 124/72 (!) 156/55 (!) 143/62  Pulse: 60 (!) 59 62 69  Resp: 16 18    Temp: 97.6 F (36.4 C) 97.6 F (36.4 C) 97.8 F (36.6 C) 97.9  F (36.6 C)  TempSrc: Oral Oral Oral Oral  SpO2: 99% 100% 100% 100%  Weight:      Height:        Intake/Output Summary (Last 24 hours) at  05/16/2019 1838 Last data filed at 05/16/2019 1300 Gross per 24 hour  Intake 120 ml  Output 200 ml  Net -80 ml   Filed Weights   05/15/19 1925 05/16/19 0013  Weight: 86.2 kg 88.8 kg    Examination:   Constitutional: NAD, AAOx3, sitting on bedside commode  HEENT: conjunctivae and lids normal, EOMI CV: RRR no M,R,G. Distal pulses +2.  No cyanosis.   RESP: CTA B/L, normal respiratory effort  GI: +BS, NTND Extremities: No effusions, edema, or tenderness in BLE SKIN: warm, dry and intact Neuro: II - XII grossly intact.  Sensation intact Psych: Normal mood and affect.  Appropriate judgement and reason   Data Reviewed: I have personally reviewed following labs and imaging studies  CBC: Recent Labs  Lab 05/12/19 0540 05/12/19 0540 05/12/19 0845 05/12/19 1628 05/13/19 0902 05/13/19 1819 05/14/19 0804 05/15/19 0347 05/15/19 2103  WBC 5.6  --  6.1  --   --   --  4.8 4.1 6.7  HGB 7.2*   < > 7.6*   < > 7.5* 7.4* 7.3* 7.3* 7.8*  HCT 22.9*   < > 24.6*   < > 24.2* 24.4* 23.6* 23.6* 25.2*  MCV 91.2  --  90.4  --   --   --  89.7 89.1 89.0  PLT 134*  --  145*  --   --   --  143* 160 216   < > = values in this interval not displayed.   Basic Metabolic Panel: Recent Labs  Lab 05/10/19 1156 05/11/19 0200 05/12/19 0205 05/15/19 0347 05/15/19 2103  NA 140 141 138 138 139  K 4.1 3.9 3.7 3.4* 4.0  CL 108 109 105 104 104  CO2 23 24 25 27 25   GLUCOSE 148* 150* 135* 140* 165*  BUN 22 19 19 10 14   CREATININE 1.34* 1.19* 1.31* 1.41* 1.64*  CALCIUM 8.8* 8.3* 8.0* 8.2* 8.9   GFR: Estimated Creatinine Clearance: 28.6 mL/min (A) (by C-G formula based on SCr of 1.64 mg/dL (H)). Liver Function Tests: Recent Labs  Lab 05/10/19 1156 05/15/19 2103  AST 17 14*  ALT 10 8  ALKPHOS 53 59  BILITOT 0.5 0.6  PROT 6.4* 6.5  ALBUMIN 3.6 3.4*   No results for input(s): LIPASE, AMYLASE in the last 168 hours. No results for input(s): AMMONIA in the last 168 hours. Coagulation  Profile: Recent Labs  Lab 05/10/19 1156 05/15/19 2103  INR 1.4* 1.1   Cardiac Enzymes: No results for input(s): CKTOTAL, CKMB, CKMBINDEX, TROPONINI in the last 168 hours. BNP (last 3 results) No results for input(s): PROBNP in the last 8760 hours. HbA1C: Recent Labs    05/16/19 0026  HGBA1C 4.5*   CBG: Recent Labs  Lab 05/11/19 1155 05/11/19 1637 05/12/19 0818 05/13/19 0740 05/14/19 0934  GLUCAP 127* 129* 125* 135* 175*   Lipid Profile: Recent Labs    05/16/19 0026  CHOL 121  HDL 41  LDLCALC 58  TRIG 112  CHOLHDL 3.0   Thyroid Function Tests: No results for input(s): TSH, T4TOTAL, FREET4, T3FREE, THYROIDAB in the last 72 hours. Anemia Panel: No results for input(s): VITAMINB12, FOLATE, FERRITIN, TIBC, IRON, RETICCTPCT in the last 72 hours. Sepsis Labs: No results for input(s): PROCALCITON, LATICACIDVEN in the last 168 hours.  Recent  Results (from the past 240 hour(s))  SARS CORONAVIRUS 2 (TAT 6-24 HRS) Nasopharyngeal Nasopharyngeal Swab     Status: None   Collection Time: 05/10/19  2:36 PM   Specimen: Nasopharyngeal Swab  Result Value Ref Range Status   SARS Coronavirus 2 NEGATIVE NEGATIVE Final    Comment: (NOTE) SARS-CoV-2 target nucleic acids are NOT DETECTED. The SARS-CoV-2 RNA is generally detectable in upper and lower respiratory specimens during the acute phase of infection. Negative results do not preclude SARS-CoV-2 infection, do not rule out co-infections with other pathogens, and should not be used as the sole basis for treatment or other patient management decisions. Negative results must be combined with clinical observations, patient history, and epidemiological information. The expected result is Negative. Fact Sheet for Patients: SugarRoll.be Fact Sheet for Healthcare Providers: https://www.woods-mathews.com/ This test is not yet approved or cleared by the [place] FDA and  has been  authorized for detection and/or diagnosis of SARS-CoV-2 by FDA under an Emergency Use Authorization (EUA). This EUA will remain  in effect (meaning this test can be used) for the duration of the COVID-19 declaration under Section 56 4(b)(1) of the Act, 21 U.S.C. section 360bbb-3(b)(1), unless the authorization is terminated or revoked sooner. Performed at [facility], [place] [address] [place], [place] [address]   SARS CORONAVIRUS 2 (TAT 6-24 HRS) Nasopharyngeal Nasopharyngeal Swab     Status: None   Collection Time: 05/15/19  8:40 PM   Specimen: Nasopharyngeal Swab  Result Value Ref Range Status   SARS Coronavirus 2 NEGATIVE NEGATIVE Final    Comment: (NOTE) SARS-CoV-2 target nucleic acids are NOT DETECTED. The SARS-CoV-2 RNA is generally detectable in upper and lower respiratory specimens during the acute phase of infection. Negative results do not preclude SARS-CoV-2 infection, do not rule out co-infections with other pathogens, and should not be used as the sole basis for treatment or other patient management decisions. Negative results must be combined with clinical observations, patient history, and epidemiological information. The expected result is Negative. Fact Sheet for Patients: SugarRoll.be Fact Sheet for Healthcare Providers: https://www.woods-mathews.com/ This test is not yet approved or cleared by the [place] FDA and  has been authorized for detection and/or diagnosis of SARS-CoV-2 by FDA under an Emergency Use Authorization (EUA). This EUA will remain  in effect (meaning this test can be used) for the duration of the COVID-19 declaration under Section 56 4(b)(1) of the Act, 21 U.S.C. section 360bbb-3(b)(1), unless the authorization is terminated or revoked sooner. Performed at [facility], [place] [address] [place], [place] [address]       Radiology Studies: CT Head Wo Contrast  Result Date:  05/15/2019 CLINICAL DATA:  Weakness EXAM: CT HEAD WITHOUT CONTRAST TECHNIQUE: Contiguous axial images were obtained from the base of the skull through the vertex without intravenous contrast. COMPARISON:  None. FINDINGS: Brain: There is no mass, hemorrhage or extra-axial collection. The size and configuration of the ventricles and extra-axial CSF spaces are normal. There is hypoattenuation of the white matter, most commonly indicating chronic small vessel disease. Vascular: Atherosclerotic calcification of the internal carotid arteries at the skull base. No abnormal hyperdensity of the major intracranial arteries or dural venous sinuses. Skull: The visualized skull base, calvarium and extracranial soft tissues are normal. Sinuses/Orbits: No fluid levels or advanced mucosal thickening of the visualized paranasal sinuses. No mastoid or middle ear effusion. The orbits are normal. IMPRESSION: Chronic small vessel disease without acute intracranial abnormality. Electronically Signed   By: Lennette Bihari  [place] M.D.   On: 05/15/2019 20:39   US Carotid Bilateral (at [facility] and [facility] only)  Result Date: 05/16/2019 CLINICAL DATA:  86 year old female with a history of stroke EXAM: BILATERAL CAROTID DUPLEX ULTRASOUND TECHNIQUE: Pearline Cables scale imaging, color Doppler and duplex ultrasound were performed of bilateral carotid and vertebral arteries in the neck. COMPARISON:  None. FINDINGS: Criteria: Quantification of carotid stenosis is based on velocity parameters that correlate the residual internal carotid diameter with NASCET-based stenosis levels, using the diameter of the distal internal carotid lumen as the denominator for stenosis measurement. The following velocity measurements were obtained: RIGHT ICA:  Systolic Q000111Q cm/sec, Diastolic 21 cm/sec CCA:  [id number] cm/sec SYSTOLIC ICA/CCA RATIO:  0.8 ECA:  184 cm/sec LEFT ICA: N/a CCA:  99991111 cm/sec SYSTOLIC ICA/CCA RATIO:  N/a ECA:  298 cm/sec Right Brachial SBP: Not acquired Left Brachial SBP:  Not acquired RIGHT CAROTID ARTERY: Calcification of the right common carotid artery. Intermediate waveform maintained. Moderate heterogeneous and partially calcified plaque at the right carotid bifurcation. Mild shadowing. Low resistance waveform of the right ICA. No significant tortuosity. RIGHT VERTEBRAL ARTERY: Antegrade flow with low resistance waveform. LEFT CAROTID ARTERY: Calcification of the left common carotid artery. High resistance waveform maintained of the CCA. Occlusion of the ICA at the bifurcation. LEFT VERTEBRAL ARTERY:  Antegrade flow with low resistance waveform. IMPRESSION: Right: Color duplex indicates moderate heterogeneous and calcified plaque, with no hemodynamically significant stenosis by duplex criteria in the extracranial cerebrovascular circulation. 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 Signed, Dulcy Fanny. Dellia Nims, RPVI [facility] Electronically Signed   By: Corrie Mckusick D.O.   On: 05/16/2019 12:35     Scheduled Meds:  aspirin  300 mg Rectal Daily   Or   aspirin EC  325 mg Oral Daily   Continuous Infusions:   LOS: 0 days     Enzo Bi, MD [facility] If 7PM-7AM, please contact night-coverage 05/16/2019, 6:38 PM

## 2019-05-16 NOTE — TOC Initial Note (Signed)
Transition of Care [facility]) - Initial/Assessment Note    Patient Details  Name: Theresa Cain MRN: [id number] Date of Birth: 1926-08-28  Transition of Care [facility]) CM/SW Contact:    Su Hilt, RN Phone Number: 05/16/2019, 12:26 PM  Clinical Narrative:          Met with the patient and her daughter She has a rolator and a BSC at home, she lives alone and her daughter is close by and helps a lot She wants [facility] and  Is agreeable to [facility], Corene Cornea will let me know if they can service her area, the patient lives in [place].   She needs  A RW and I notified Brad with [employer]  Will cont to monitor for needs          Expected Discharge Plan: [place] Barriers to Discharge: Barriers Resolved   Patient Goals and CMS Choice Patient states their goals for this hospitalization and ongoing recovery are:: go home      Expected Discharge Plan and Services Expected Discharge Plan: [place]   Discharge Planning Services: CM Consult   Living arrangements for the past 2 months: Single Family Home                 DME Arranged: Walker rolling DME Agency: [employer] Date DME Agency Contacted: 05/16/19 Time DME Agency Contacted: [id number] Representative spoke with at DME Agency: [place]: PT, Nurse's Aide [place] Agency: [place] ([place]) Date [place]: 05/16/19 Time [place]: 59 Representative spoke with at [place]: Corene Cornea  Prior Living Arrangements/Services Living arrangements for the past 2 months: Single Family Home Lives with:: Self(daughter near by) Patient language and need for interpreter reviewed:: Yes Do you feel safe going back to the place where you live?: Yes      Need for Family Participation in Patient Care: No (Comment) Care giver support system in place?: Yes (comment) Current home services: DME(BSC, ROlator) Criminal Activity/Legal Involvement Pertinent to Current  Situation/Hospitalization: No - Comment as needed  Activities of Daily Living Home Assistive Devices/Equipment: Environmental consultant (specify type) ADL Screening (condition at time of admission) Patient's cognitive ability adequate to safely complete daily activities?: Yes Is the patient deaf or have difficulty hearing?: No Does the patient have difficulty seeing, even when wearing glasses/contacts?: No Does the patient have difficulty concentrating, remembering, or making decisions?: No Patient able to express need for assistance with ADLs?: Yes Does the patient have difficulty dressing or bathing?: No Independently performs ADLs?: Yes (appropriate for developmental age) Does the patient have difficulty walking or climbing stairs?: No Weakness of Legs: Both Weakness of Arms/Hands: None  Permission Sought/Granted   Permission granted to share information with : Yes, Verbal Permission Granted              Emotional Assessment Appearance:: Appears stated age Attitude/Demeanor/Rapport: Engaged Affect (typically observed): Appropriate Orientation: : Oriented to Self, Oriented to Situation, Oriented to Place, Oriented to  Time Alcohol / Substance Use: Not Applicable Psych Involvement: No (comment)  Admission diagnosis:  Stroke [facility]) [I63.9] Permanent atrial fibrillation (HCC) [I48.21] Right arm weakness [R29.898] Acute focal neurological deficit, onset within 3-24 hours [R29.818] Patient Active Problem List   Diagnosis Date Noted  . Iron deficiency anemia 05/16/2019  . Acute focal neurological deficit, onset within 3-24 hours 05/15/2019  . Gastric tumor 05/15/2019  . CKD (chronic kidney disease), stage IIIa 05/10/2019  . Hypothyroidism   . HLD (hyperlipidemia)   .  Anemia due to GI blood loss   . Acute GI bleeding   . 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 Severe aortic stenosis   . Coronary artery disease involving native coronary artery of native heart   .  Chronic diastolic CHF (congestive heart failure) ([place]) 03/23/2017  . HTN (hypertension) 03/23/2017  . Permanent atrial fibrillation ([place]) 03/23/2017  . GERD (gastroesophageal reflux disease) 03/23/2017   PCP:  Kendrick Ranch, MD Pharmacy:   [place], [place] - [address] [place] [address] Phone: [phone] Fax: [phone]     Social Determinants of Health (SDOH) Interventions    Readmission Risk Interventions No flowsheet data found.

## 2019-05-16 NOTE — Evaluation (Signed)
Occupational Therapy Evaluation Patient Details Name: Theresa Cain MRN: [id number] DOB: [birth year] Today's Date: 05/16/2019    History of Present Illness Pt. is a 86 y.o. female who was admitted to [facility] with right sided weakness. CT Imaging revealed Chronic small vessel disease without acute intracranial abnormality. Pt. was hospitalized  05/10/19-05/15/19 with a GI Bleed. 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   Clinical Impression   Pt. Presents with RUE weakness, impaired right hand FMC, limited activity tolerance, and limited functional mobility which limits her ability to complete basic ADL and IADL functioning. Pt. Resides at home alone. Pt. Has a son who lives closeby. Pt. Was independent with ADLs, and IADL functioning: including meal preparation, and medication management, and Was able to drive. Pt. SO2 97%, and HR 60 bpms. Pt. Presents with limited RUE strength, and [place]. RUE shoulder flexion, abduction 4-/5, elbow flexion, extension 4+/5, wrist extension 3-/5. Wrist extension ROM: 38(60).  Pt. Reports that her right hand is improving since admission yesterday. Pt. Requires assist with ADLS as indicated below. Pt. Could benefit from OT services for ADL training, A/E training, RUE neuromuscular re-education, and pt. Education about home modification, and DME. Pt. Plans to return home upon discharge with family to assist pt. as needed. Pt. Could benefit from follow-up [place] services upon discharge to maximize independence.    Follow Up Recommendations  Home health OT    Equipment Recommendations       Recommendations for Other Services       Precautions / Restrictions Precautions Precautions: None Restrictions Weight Bearing Restrictions: Yes      Mobility Bed Mobility Overal bed mobility: Independent                Transfers Overall transfer level: Needs assistance   Transfers: Sit to/from  Stand Sit to Stand: Min assist         General transfer comment: Transfers CGA with rolling walker    Balance                                           ADL either performed or assessed with clinical judgement   ADL Overall ADL's : Needs assistance/impaired Eating/Feeding: Set up;Supervision/ safety   Grooming: Set up;Min guard   Upper Body Bathing: Set up;Independent   Lower Body Bathing: Set up;Min guard   Upper Body Dressing : Set up;Min guard   Lower Body Dressing: Set up;Min guard   Toilet Transfer: Min guard   Toileting- Clothing Manipulation and Hygiene: Moderate assistance Toileting - Clothing Manipulation Details (indicate cue type and reason): Assist for thorough hygiene following BM     Functional mobility during ADLs: Independent       Vision Baseline Vision/History: Wears glasses Wears Glasses: Reading only Patient Visual Report: No change from baseline       Perception     Praxis      Pertinent Vitals/Pain Pain Assessment: No/denies pain     Hand Dominance Right   Extremity/Trunk Assessment Upper Extremity Assessment Upper Extremity Assessment: Generalized weakness;RUE deficits/detail RUE Deficits / Details: RUE shoulder flexion, abduction 4-/5, elbow flexion, extension 4+/5, wrist extension 3-/5. RUE Sensation: WNL RUE Coordination: decreased fine motor           Communication     Cognition Arousal/Alertness: Awake/alert Behavior During Therapy: WFL for tasks assessed/performed Overall Cognitive Status:  Within Functional Limits for tasks assessed                                     General Comments       Exercises     Shoulder Instructions      Home Living Family/patient expects to be discharged to:: Private residence Living Arrangements: Alone Available Help at Discharge: Family Type of Home: House Home Access: Stairs to enter     [place]: One level     Bathroom Shower/Tub:  Tub/shower unit;Curtain                    Prior Functioning/Environment Level of Independence: Independent                 OT Problem List: Decreased strength;Decreased coordination;Impaired UE functional use      OT Treatment/Interventions: Self-care/ADL training    OT Goals(Current goals can be found in the care plan section) Acute Rehab OT Goals Patient Stated Goal: To regain use use of her right hand OT Goal Formulation: With patient Potential to Achieve Goals: Good  OT Frequency: Min 2X/week   Barriers to D/C:            Co-evaluation              AM-PAC OT "6 Clicks" Daily Activity     Outcome Measure Help from another person eating meals?: A Little Help from another person taking care of personal grooming?: A Little Help from another person toileting, which includes using toliet, bedpan, or urinal?: A Little Help from another person bathing (including washing, rinsing, drying)?: A Little Help from another person to put on and taking off regular upper body clothing?: A Little Help from another person to put on and taking off regular lower body clothing?: A Little 6 Click Score: 18   End of Session    Activity Tolerance: Patient tolerated treatment well Patient left: in bed                   Time: [id number] OT Time Calculation (min): 35 min Charges:  OT General Charges $OT Visit: 1 Visit OT Evaluation $OT Eval Moderate Complexity: 1 Mod  Harrel Carina, MS, OTR/L   Harrel Carina 05/16/2019, 10:01 AM

## 2019-05-16 NOTE — Evaluation (Signed)
Physical Therapy Evaluation Patient Details Name: Theresa Cain MRN: [id number] DOB: 06/21/1926 Today's Date: 05/16/2019   History of Present Illness  Pt. is a 86 y.o. female who was admitted to [facility] with right sided weakness. CT Imaging revealed Chronic small vessel disease without acute intracranial abnormality; unable to complete MRI due to PPM. Pt. was hospitalized  05/10/19-05/15/19 with a GI Bleed. 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  Clinical Impression  Upon evaluation, patient alert and oriented; follows commands and eager to participate with session as able.  Reports continued weakness, coordination deficit in R hand, but feels it has improved since admission.  Strength appears grossly 3- to 4-/5 throughout R UE and at least 4/5 throughout R LE.  Denies sensory deficit, but does display moderate coordination/fine motor control deficits/dysmetria R UE > LE.  Currently able to complete bed mobility with mod indep; sit/stand, basic transfers and gait (200') with RW, cga/min assist.  Demonstrates reciprocal stepping pattern with mild ataxia R LE (inconsistent foot placement); mild sway/deviation with head turns, cga/min assist to maintain balance.  Increased time for/attention for R UE placement on RW, but able to maintain grasp without difficulty.  Do recommend continued use of RW at all times; patient aware and in agreement. Would benefit from skilled PT to address above deficits and promote optimal return to PLOF.; Recommend transition to HHPT upon discharge from acute hospitalization.      Follow Up Recommendations Home health PT    Equipment Recommendations  Rolling walker with 5" wheels    Recommendations for Other Services       Precautions / Restrictions Precautions Precautions: Fall Restrictions Weight Bearing Restrictions: No      Mobility  Bed Mobility Overal bed mobility: Needs  Assistance Bed Mobility: Sit to Supine       Sit to supine: Modified independent (Device/Increase time)      Transfers Overall transfer level: Needs assistance Equipment used: Rolling walker (2 wheeled) Transfers: Sit to/from Stand Sit to Stand: Min assist         General transfer comment: multiple attempts, use of UEs and increased time required for lift off; broad BOS  Ambulation/Gait Ambulation/Gait assistance: Min guard;Min assist Gait Distance (Feet): 200 Feet Assistive device: Rolling walker (2 wheeled)       General Gait Details: reciprocal stepping pattern with mild ataxia R LE (inconsistent foot placement); mild sway/deviation with head turns, cga/min assist to maintain balance.  Increased time for/attention for R UE placement on RW, but able to maintain grasp without difficulty.  Do recommend continued use of RW at all times; patient aware and in agreement.  Stairs            Wheelchair Mobility    Modified Rankin (Stroke Patients Only)       Balance Overall balance assessment: Needs assistance Sitting-balance support: No upper extremity supported;Feet supported Sitting balance-Leahy Scale: Good     Standing balance support: Bilateral upper extremity supported Standing balance-Leahy Scale: Fair                               Pertinent Vitals/Pain Pain Assessment: No/denies pain    Home Living Family/patient expects to be discharged to:: Private residence Living Arrangements: Alone Available Help at Discharge: Family;Available PRN/intermittently Type of Home: House Home Access: Stairs to enter Entrance Stairs-Rails: Right Entrance Stairs-Number of Steps: 4 Home Layout: One level Home Equipment:  Walker - 4 wheels;Cane - single point      Prior Function Level of Independence: Independent with assistive device(s)         Comments: Indep with ADLs, household and community mobilization without assist device; does use SPC as  needed when outside of home.  + driving.  Denies fall history.     Hand Dominance   Dominant Hand: Right    Extremity/Trunk Assessment   Upper Extremity Assessment Upper Extremity Assessment: Overall WFL for tasks assessed(grossly 4/5 throughout R elbow and shoulder, 3-/5 L hand; denies sensory deficit. Mod dysmetria and coordination deficit with finger to nose.) RUE Deficits / Details: RUE shoulder flexion, abduction 4-/5, elbow flexion, extension 4+/5, wrist extension 3-/5. RUE Sensation: WNL RUE Coordination: decreased fine motor    Lower Extremity Assessment Lower Extremity Assessment: Overall WFL for tasks assessed(grossly at least 4/5 bilat; denies sensory deficit; mild ataxia R LE noted with gait efforts)       Communication   Communication: No difficulties  Cognition Arousal/Alertness: Awake/alert Behavior During Therapy: WFL for tasks assessed/performed Overall Cognitive Status: Within Functional Limits for tasks assessed                                        General Comments      Exercises Other Exercises Other Exercises: Toilet transfer, SPT with RW, cga/min assist; sit/stand from [facility] with RW, cga/min assist. Max assist for hygiene after continent BM   Assessment/Plan    PT Assessment Patient needs continued PT services  PT Problem List Decreased strength;Decreased activity tolerance;Decreased balance;Decreased mobility;Decreased range of motion;Decreased coordination;Decreased knowledge of use of DME;Decreased safety awareness;Decreased knowledge of precautions       PT Treatment Interventions DME instruction;Gait training;Stair training;Functional mobility training;Therapeutic activities;Therapeutic exercise;Balance training;Neuromuscular re-education;Cognitive remediation;Patient/family education    PT Goals (Current goals can be found in the Care Plan section)  Acute Rehab PT Goals Patient Stated Goal: To regain use use of her right  hand PT Goal Formulation: With patient/family Time For Goal Achievement: 05/30/19 Potential to Achieve Goals: Good    Frequency 7X/week   Barriers to discharge        Co-evaluation               AM-PAC PT "6 Clicks" Mobility  Outcome Measure Help needed turning from your back to your side while in a flat bed without using bedrails?: None Help needed moving from lying on your back to sitting on the side of a flat bed without using bedrails?: None Help needed moving to and from a bed to a chair (including a wheelchair)?: A Little Help needed standing up from a chair using your arms (e.g., wheelchair or bedside chair)?: A Little Help needed to walk in hospital room?: A Little Help needed climbing 3-5 steps with a railing? : A Little 6 Click Score: 20    End of Session Equipment Utilized During Treatment: Gait belt Activity Tolerance: Patient tolerated treatment well Patient left: in bed;with call bell/phone within reach;with bed alarm set;with family/visitor present Nurse Communication: Mobility status PT Visit Diagnosis: Muscle weakness (generalized) (M62.81);Difficulty in walking, not elsewhere classified (R26.2);Hemiplegia and hemiparesis Hemiplegia - Right/Left: Right Hemiplegia - dominant/non-dominant: Dominant Hemiplegia - caused by: Cerebral infarction    Time: [id number] PT Time Calculation (min) (ACUTE ONLY): 26 min   Charges:   PT Evaluation $PT Eval Moderate Complexity: 1 Mod PT Treatments $Therapeutic Activity: 8-22  mins        Caeden Foots H. Owens Shark, PT, DPT, NCS 05/16/19, 1:52 PM [phone]

## 2019-05-16 NOTE — Progress Notes (Signed)
SLP Cancellation Note  Patient Details Name: Theresa Cain MRN: [id number] DOB: 1926/03/28   Cancelled treatment:        Chart reviewed, Pt visited, Nsg consulted. Pt just finished PT evaluation, sitting upright for Regular meal. Pt, daughter and Nsg all report no dysphagia with meals or meds. Speech clear and appropriate. NO apparent ST needs at this time to warrant full assessment. Please reconsult if any dysphagia or Communication needs arise.   Lucila Maine 05/16/2019, 12:17 PM

## 2019-05-16 NOTE — Telephone Encounter (Signed)
Patient with a history of atrial fibrillation and CVA. If anticoagulation needs to be held it should be held for the shortest time period possible to reduce the risk of recurrent CVA.

## 2019-05-16 NOTE — Telephone Encounter (Signed)
Dr. Janese Banks, this is per dr. Curt Bears (cardiologist): Patient with a history of atrial fibrillation and CVA. If anticoagulation needs to be held it should be held for the shortest time period possible to reduce the risk of recurrent CVA.  This message could be read on patient's telephone encounter from today-05/16/19.

## 2019-05-16 NOTE — Telephone Encounter (Signed)
Needs input from Cardiologist (Dr Rockey Situ and or DR Curt Bears)   Patient has a history significant for atrial fibrillation, carotid artery disease, hypertension, CVA, PAD.

## 2019-05-17 ENCOUNTER — Other Ambulatory Visit: Payer: Self-pay

## 2019-05-17 ENCOUNTER — Inpatient Hospital Stay: Payer: Medicare Other

## 2019-05-17 ENCOUNTER — Inpatient Hospital Stay (HOSPITAL_COMMUNITY)
Admit: 2019-05-17 | Discharge: 2019-05-17 | Disposition: A | Discharge status: Home / Self Care | Payer: Medicare Other | Attending: Internal Medicine | Admitting: Internal Medicine

## 2019-05-17 ENCOUNTER — Other Ambulatory Visit: Payer: Self-pay | Admitting: Oncology

## 2019-05-17 DIAGNOSIS — C169 Malignant neoplasm of stomach, unspecified: Secondary | ICD-10-CM

## 2019-05-17 DIAGNOSIS — R29818 Other symptoms and signs involving the nervous system: Secondary | ICD-10-CM

## 2019-05-17 DIAGNOSIS — I6389 Other cerebral infarction: Secondary | ICD-10-CM

## 2019-05-17 LAB — BASIC METABOLIC PANEL
Anion gap: 8 (ref 5–15)
BUN: 11 mg/dL (ref 8–23)
CO2: 25 mmol/L (ref 22–32)
Calcium: 8.5 mg/dL — ABNORMAL LOW (ref 8.9–10.3)
Chloride: 106 mmol/L (ref 98–111)
Creatinine, Ser: 1.36 mg/dL — ABNORMAL HIGH (ref 0.44–1.00)
GFR calc Af Amer: 40 mL/min — ABNORMAL LOW (ref >60)
GFR calc non Af Amer: 34 mL/min — ABNORMAL LOW (ref >60)
Glucose, Bld: 117 mg/dL — ABNORMAL HIGH (ref 70–99)
Potassium: 3.9 mmol/L (ref 3.5–5.1)
Sodium: 139 mmol/L (ref 135–145)

## 2019-05-17 LAB — CBC
HCT: 25.1 % — ABNORMAL LOW (ref 36.0–46.0)
Hemoglobin: 7.6 g/dL — ABNORMAL LOW (ref 12.0–15.0)
MCH: 27.6 pg (ref 26.0–34.0)
MCHC: 30.3 g/dL (ref 30.0–36.0)
MCV: 91.3 fL (ref 80.0–100.0)
Platelets: 171 10*3/uL (ref 150–400)
RBC: 2.75 MIL/uL — ABNORMAL LOW (ref 3.87–5.11)
RDW: 16.4 % — ABNORMAL HIGH (ref 11.5–15.5)
WBC: 5.1 10*3/uL (ref 4.0–10.5)
nRBC: 0 % (ref 0.0–0.2)

## 2019-05-17 LAB — ECHOCARDIOGRAM COMPLETE
Height: 72 in
Weight: 3132.3 oz

## 2019-05-17 LAB — MAGNESIUM: Magnesium: 2.1 mg/dL (ref 1.7–2.4)

## 2019-05-17 LAB — SURGICAL PATHOLOGY

## 2019-05-17 MED ORDER — ASPIRIN EC 81 MG PO TBEC: 81.0000 mg | DELAYED_RELEASE_TABLET | Freq: Every day | ORAL | Status: DC

## 2019-05-17 MED ORDER — LEVOTHYROXINE SODIUM 50 MCG PO TABS
50.0000 ug | ORAL_TABLET | Freq: Every day | ORAL | Status: DC
  Administered 2019-05-17: 50 ug via ORAL
  Filled 2019-05-17: qty 1

## 2019-05-17 MED ORDER — ELIQUIS 2.5 MG PO TABS: ORAL_TABLET | ORAL | Status: DC

## 2019-05-17 MED ORDER — PANTOPRAZOLE SODIUM 40 MG PO TBEC
40.0000 mg | DELAYED_RELEASE_TABLET | Freq: Two times a day (BID) | ORAL | Status: DC
  Administered 2019-05-17: 40 mg via ORAL
  Filled 2019-05-17: qty 1

## 2019-05-17 MED ORDER — PRAVASTATIN SODIUM 20 MG PO TABS
40.0000 mg | ORAL_TABLET | Freq: Every evening | ORAL | Status: DC
  Administered 2019-05-17: 40 mg via ORAL
  Filled 2019-05-17: qty 2

## 2019-05-17 MED ORDER — SODIUM CHLORIDE 0.9 % IV SOLN
510.0000 mg | Freq: Once | INTRAVENOUS | Status: AC
  Administered 2019-05-17: 510 mg via INTRAVENOUS
  Filled 2019-05-17: qty 17

## 2019-05-17 MED ORDER — ESCITALOPRAM OXALATE 10 MG PO TABS
10.0000 mg | ORAL_TABLET | Freq: Every day | ORAL | Status: DC
  Administered 2019-05-17: 10 mg via ORAL
  Filled 2019-05-17: qty 1

## 2019-05-17 MED ORDER — DILTIAZEM HCL ER COATED BEADS 180 MG PO CP24
180.0000 mg | ORAL_CAPSULE | Freq: Every day | ORAL | Status: DC
  Administered 2019-05-17: 180 mg via ORAL
  Filled 2019-05-17: qty 1

## 2019-05-17 MED ORDER — SODIUM CHLORIDE 0.9 % IV SOLN
INTRAVENOUS | Status: DC | PRN
  Administered 2019-05-17: 250 mL via INTRAVENOUS

## 2019-05-17 NOTE — Progress Notes (Signed)
Received MD order to discharge patient to home, reviewed homes meds, prescriptions and follow up appointments with patient and patient verbalized understanding

## 2019-05-17 NOTE — Progress Notes (Signed)
*  PRELIMINARY RESULTS* Echocardiogram 2D Echocardiogram has been performed.  Theresa Cain 05/17/2019, 12:42 PM

## 2019-05-17 NOTE — Discharge Instructions (Signed)

## 2019-05-17 NOTE — Progress Notes (Signed)
Physical Therapy Treatment Patient Details Name: Theresa Cain MRN: [id number] DOB: 1926/07/20 Today's Date: 05/17/2019    History of Present Illness Pt. is a 86 y.o. female who was admitted to [facility] with right sided weakness. CT Imaging revealed Chronic small vessel disease without acute intracranial abnormality; unable to complete MRI due to PPM. Pt. was hospitalized  05/10/19-05/15/19 with a GI Bleed. 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    PT Comments    Pt was long sitting in bed upon arriving with daughter at bedside. Pt very pleasant and cooperative throughout. She has no c/o pain or discomfort. Anxious to get OOB. Pt was able to exit L side of bed with supervision. Sat EOB x 2 minutes prior to ambulating 1 lap in hallway using RW+ gait belt. CGA during turning 2/2 to unsteadiness noted.No LOB with therapist intervention. Pt returned to room seated up in recliner with chair alarm set and daughter present. She tolerated session well and will benefit from HHPT to address present deficits and assist pt to returning to PLOF.     Follow Up Recommendations  Home health PT     Equipment Recommendations  None recommended by PT(pt's personal RW in room)    Recommendations for Other Services       Precautions / Restrictions Precautions Precautions: Fall Restrictions Weight Bearing Restrictions: No    Mobility  Bed Mobility Overal bed mobility: Modified Independent Bed Mobility: Supine to Sit     Supine to sit: Supervision     General bed mobility comments: HOB slightly elevated. vcs for technique only. Sat EOB x 2 minutes prior to transferring OOB.   Transfers Overall transfer level: Needs assistance Equipment used: Rolling walker (2 wheeled) Transfers: Sit to/from Stand Sit to Stand: Min guard         General transfer comment: CGA for safety to stand from EOB to RW. bed height low and pt still  able to complete without lifting assist. She also was able to sit/stand from recliner with CGA progressing to supervision. STS 3 x from recliner  Ambulation/Gait Ambulation/Gait assistance: Min guard;Supervision Gait Distance (Feet): 160 Feet Assistive device: Rolling walker (2 wheeled) Gait Pattern/deviations: WFL(Within Functional Limits) Gait velocity: WNL   General Gait Details: Pt was able to ambulate 160 ft with RW + gait belt. CGA at first and during turning only. pt was able to progress to supervision with use of RW.    Stairs Stairs: (Rn waiting to run meds. Will trial stairs next date)           Wheelchair Mobility    Modified Rankin (Stroke Patients Only)       Balance Overall balance assessment: Needs assistance Sitting-balance support: No upper extremity supported;Feet supported Sitting balance-Leahy Scale: Good Sitting balance - Comments: Supervision for EOB sitting for safety   Standing balance support: Bilateral upper extremity supported Standing balance-Leahy Scale: Fair Standing balance comment: Pt has unsteadiness during turning only. No LOB with intervention required.                            Cognition Arousal/Alertness: Awake/alert Behavior During Therapy: WFL for tasks assessed/performed Overall Cognitive Status: Within Functional Limits for tasks assessed                                 General Comments: Pt is  A and O x 4. Cooperative and pleasant throughout.      Exercises      General Comments        Pertinent Vitals/Pain Pain Assessment: No/denies pain    Home Living                      Prior Function            PT Goals (current goals can now be found in the care plan section) Acute Rehab PT Goals Patient Stated Goal: " I wanted to go home today but looks like tomorrow" Progress towards PT goals: Progressing toward goals    Frequency    7X/week      PT Plan Current plan remains  appropriate    Co-evaluation              AM-PAC PT "6 Clicks" Mobility   Outcome Measure  Help needed turning from your back to your side while in a flat bed without using bedrails?: None Help needed moving from lying on your back to sitting on the side of a flat bed without using bedrails?: None Help needed moving to and from a bed to a chair (including a wheelchair)?: A Little Help needed standing up from a chair using your arms (e.g., wheelchair or bedside chair)?: A Little Help needed to walk in hospital room?: A Little Help needed climbing 3-5 steps with a railing? : A Little 6 Click Score: 20    End of Session Equipment Utilized During Treatment: Gait belt Activity Tolerance: Patient tolerated treatment well Patient left: in chair;with call bell/phone within reach;with chair alarm set;with family/visitor present Nurse Communication: Mobility status PT Visit Diagnosis: Muscle weakness (generalized) (M62.81);Difficulty in walking, not elsewhere classified (R26.2);Hemiplegia and hemiparesis Hemiplegia - Right/Left: Right Hemiplegia - dominant/non-dominant: Dominant Hemiplegia - caused by: Cerebral infarction     Time: 1415-1433 PT Time Calculation (min) (ACUTE ONLY): 18 min  Charges:  $Gait Training: 8-22 mins                     Julaine Fusi PTA 05/17/19, 3:55 PM

## 2019-05-17 NOTE — Consult Note (Signed)
Hematology/Oncology Consult note [facility] Telephone:([phone] [phone]  Patient Care Team: Kendrick Ranch, MD as PCP - General (Internal Medicine) Constance Haw, MD as PCP - Electrophysiology (Cardiology) Sherren Mocha, MD as PCP - Cardiology (Cardiology)   Name of the patient: Theresa Cain  [id number]  Jan 03, 1927    Reason for consult: new diagnosis of gastric adenocarcinoma   Requesting physician: Dr. Enzo Bi  Date of visit: 05/17/2019    History of presenting illness- Patient is a 86 year old female with prior h/o neuroendocrine tumor in 2014. Now presenting with mass in gastric cardia and acute upper GI bleed s/p hemospray. Was discharged and eliquis was held and came with wrist drop- radial nerve palsy versus acute ischemic stroke per neurology. Pathology from gastric mass shows gastric adenocarcinoma. Wrist drop has resolved but hand grip remains weak   ECOG PS- 1  Pain scale- 0   Review of systems- Review of Systems  Constitutional: Positive for malaise/fatigue. Negative for chills, fever and weight loss.  HENT: Negative for congestion, ear discharge and nosebleeds.   Eyes: Negative for blurred vision.  Respiratory: Negative for cough, hemoptysis, sputum production, shortness of breath and wheezing.   Cardiovascular: Negative for chest pain, palpitations, orthopnea and claudication.  Gastrointestinal: Negative for abdominal pain, blood in stool, constipation, diarrhea, heartburn, melena, nausea and vomiting.  Genitourinary: Negative for dysuria, flank pain, frequency, hematuria and urgency.  Musculoskeletal: Negative for back pain, joint pain and myalgias.  Skin: Negative for rash.  Neurological: Negative for dizziness, tingling, focal weakness, seizures, weakness and headaches.  Endo/Heme/Allergies: Does not bruise/bleed easily.  Psychiatric/Behavioral: Negative for depression and suicidal ideas. The patient  does not have insomnia.     Allergies  Allergen Reactions  . Penicillins Hives and Other (See Comments)    PATIENT HAS HAD A PCN REACTION WITH IMMEDIATE RASH, FACIAL/TONGUE/THROAT SWELLING, SOB, OR LIGHTHEADEDNESS WITH HYPOTENSION:  #  #  #  YES  #  #  #   Has patient had a PCN reaction causing severe rash involving mucus membranes or skin necrosis: No Has patient had a PCN reaction that required hospitalization: No Has patient had a PCN reaction occurring within the last 10 years: No If all of the above answers are "NO", then may proceed with Cephalosporin use.   . Sulfa Antibiotics Hives  . Cymbalta [Duloxetine Hcl]     Sweat, feels like having a heart attack     Patient Active Problem List   Diagnosis Date Noted  . Iron deficiency anemia 05/16/2019  . Stroke-like symptom 05/16/2019  . Acute focal neurological deficit, onset within 3-24 hours 05/15/2019  . Gastric tumor 05/15/2019  . CKD (chronic kidney disease), stage IIIa 05/10/2019  . Hypothyroidism   . HLD (hyperlipidemia)   . Anemia due to GI blood loss   . Acute GI bleeding   . 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 Severe aortic stenosis   . Coronary artery disease involving native coronary artery of native heart   . Chronic diastolic CHF (congestive heart failure) ([place]) 03/23/2017  . HTN (hypertension) 03/23/2017  . Permanent atrial fibrillation ([place]) 03/23/2017  . GERD (gastroesophageal reflux disease) 03/23/2017     Past Medical History:  Diagnosis Date  . Acute on chronic diastolic CHF (congestive heart failure) ([place]) 02/22/2016  . Anemia   . Bradycardia   . Carotid artery occlusion   . Chronic diastolic CHF (congestive heart failure) ([place])   . Chronic kidney disease  elevated creatinne  . Coronary artery disease involving native coronary artery of native heart   . GERD (gastroesophageal reflux disease)   . HLD (hyperlipidemia)   . Hypertension   . Hypothyroidism   . Obesity     . Osteoarthritis   . Permanent atrial fibrillation ([place])   . Presence of permanent cardiac pacemaker   . PVD (peripheral vascular disease) ([place])   . 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 Severe aortic stenosis   . Stomach cancer ([place]) ~ 2013  . Vertigo      Past Surgical History:  Procedure Laterality Date  . CARDIOVASCULAR STRESS TEST    . CARDIOVERSION N/A 08/08/2016   Procedure: CARDIOVERSION;  Surgeon: Sueanne Margarita, MD;  Location: [facility] ENDOSCOPY;  Service: Cardiovascular;  Laterality: N/A;  . CAROTID ENDARTERECTOMY Right ~ 2005  . CATARACT EXTRACTION W/ INTRAOCULAR LENS IMPLANT Right   . CHOLECYSTECTOMY  ~ 2013   "part of her [facility] OR"  . EP IMPLANTABLE DEVICE N/A 03/04/2016   Procedure: Pacemaker Implant;  Surgeon: Will Meredith Leeds, MD;  Location: [facility] CV LAB;  Service: Cardiovascular;  Laterality: N/A;  . EP IMPLANTABLE DEVICE N/A 03/05/2016   Procedure: Lead Revision/Repair;  Surgeon: Deboraha Sprang, MD;  Location: [place] CV LAB;  Service: Cardiovascular;  Laterality: N/A;  . ESOPHAGOGASTRODUODENOSCOPY (EGD) WITH PROPOFOL N/A 05/13/2019   Procedure: ESOPHAGOGASTRODUODENOSCOPY (EGD) WITH PROPOFOL;  Surgeon: Jonathon Bellows, MD;  Location: [facility] ENDOSCOPY;  Service: Gastroenterology;  Laterality: N/A;  . INSERT / REPLACE / REMOVE PACEMAKER    . LEFT HEART CATH AND CORONARY ANGIOGRAPHY N/A 03/27/2017   Procedure: LEFT HEART CATH AND CORONARY ANGIOGRAPHY;  Surgeon: Minna Merritts, MD;  Location: [place] CV LAB;  Service: Cardiovascular;  Laterality: N/A;  . TEE WITHOUT CARDIOVERSION N/A 05/12/2017   Procedure: TRANSESOPHAGEAL ECHOCARDIOGRAM (TEE);  Surgeon: Sherren Mocha, MD;  Location: [place];  Service: Open Heart Surgery;  Laterality: N/A;  . TRANSCATHETER AORTIC VALVE REPLACEMENT, TRANSFEMORAL N/A 05/12/2017   Procedure:  TRANSCATHETER AORTIC VALVE REPLACEMENT, TRANSFEMORAL using a 45mm Edwards Sapien 3 Aortic Valve;  Surgeon: Sherren Mocha, MD;  Location: [place];  Service: Open Heart Surgery;  Laterality: N/A;  . US ECHOCARDIOGRAPHY    . VAGINAL HYSTERECTOMY    . WHIPPLE PROCEDURE  ~ 2013    Social History   Socioeconomic History  . Marital status: Widowed    Spouse name: Not on file  . Number of children: Not on file  . Years of education: Not on file  . Highest education level: Not on file  Occupational History  . Not on file  Tobacco Use  . Smoking status: Never Smoker  . Smokeless tobacco: Never Used  Substance and Sexual Activity  . Alcohol use: No  . Drug use: No  . Sexual activity: Never  Other Topics Concern  . Not on file  Social History Narrative  . Not on file   Social Determinants of Health   Financial Resource Strain:   . Difficulty of Paying Living Expenses: Not on file  Food Insecurity:   . Worried About Charity fundraiser in the Last Year: Not on file  . Ran Out of Food in the Last Year: Not on file  Transportation Needs:   . Lack of Transportation (Medical): Not on file  . Lack of Transportation (Non-Medical): Not on file  Physical Activity:   .  Days of Exercise per Week: Not on file  . Minutes of Exercise per Session: Not on file  Stress:   . Feeling of Stress : Not on file  Social Connections:   . Frequency of Communication with Friends and Family: Not on file  . Frequency of Social Gatherings with Friends and Family: Not on file  . Attends Religious Services: Not on file  . Active Member of Clubs or Organizations: Not on file  . Attends Archivist Meetings: Not on file  . Marital Status: Not on file  Intimate Partner Violence:   . Fear of Current or Ex-Partner: Not on file  . Emotionally Abused: Not on file  . Physically Abused: Not on file  . Sexually Abused: Not on file     Family History  Problem Relation Age of Onset  . Heart disease  Sister      Current Facility-Administered Medications:  .  0.9 %  sodium chloride infusion, , Intravenous, PRN, Enzo Bi, MD, Last Rate: 10 mL/hr at 05/17/19 1500, Rate Verify at 05/17/19 1500 .  acetaminophen (TYLENOL) tablet 650 mg, 650 mg, Oral, Q4H PRN **OR** acetaminophen (TYLENOL) 160 MG/5ML solution 650 mg, 650 mg, Per Tube, Q4H PRN **OR** acetaminophen (TYLENOL) suppository 650 mg, 650 mg, Rectal, Q4H PRN, Judd Gaudier V, MD .  aspirin suppository 300 mg, 300 mg, Rectal, Daily **OR** aspirin EC tablet 325 mg, 325 mg, Oral, Daily, Judd Gaudier V, MD, 325 mg at 05/17/19 0912 .  diltiazem (CARDIZEM CD) 24 hr capsule 180 mg, 180 mg, Oral, Daily, Enzo Bi, MD, 180 mg at 05/17/19 0913 .  escitalopram (LEXAPRO) tablet 10 mg, 10 mg, Oral, QHS, Enzo Bi, MD .  levothyroxine (SYNTHROID) tablet 50 mcg, 50 mcg, Oral, Q0600, Enzo Bi, MD, 50 mcg at 05/17/19 0912 .  pantoprazole (PROTONIX) EC tablet 40 mg, 40 mg, Oral, BID, Enzo Bi, MD, 40 mg at 05/17/19 0912 .  pravastatin (PRAVACHOL) tablet 40 mg, 40 mg, Oral, QPM, Enzo Bi, MD .  senna-docusate (Senokot-S) tablet 1 tablet, 1 tablet, Oral, QHS PRN, Athena Masse, MD   Physical exam:  Vitals:   05/16/19 2200 05/17/19 0737 05/17/19 0900 05/17/19 0903  BP: 130/68 131/71 (!) 131/53 (!) 104/56  Pulse: 64 60 65 65  Resp: 16 18    Temp: 98 F (36.7 C) 97.7 F (36.5 C)    TempSrc: Oral Oral    SpO2:  99%    Weight:      Height:       Physical Exam HENT:     Head: Normocephalic and atraumatic.  Eyes:     Pupils: Pupils are equal, round, and reactive to light.  Cardiovascular:     Rate and Rhythm: Normal rate and regular rhythm.     Heart sounds: Normal heart sounds.  Pulmonary:     Effort: Pulmonary effort is normal.     Breath sounds: Normal breath sounds.  Abdominal:     General: Bowel sounds are normal.     Palpations: Abdomen is soft.  Musculoskeletal:     Cervical back: Normal range of motion.  Skin:    General: Skin  is warm and dry.  Neurological:     Mental Status: She is alert and oriented to person, place, and time.        CMP Latest Ref Rng & Units 05/17/2019  Glucose 70 - 99 mg/dL 117(H)  BUN 8 - 23 mg/dL 11  Creatinine 0.44 - 1.00 mg/dL 1.36(H)  Sodium  135 - 145 mmol/L 139  Potassium 3.5 - 5.1 mmol/L 3.9  Chloride 98 - 111 mmol/L 106  CO2 22 - 32 mmol/L 25  Calcium 8.9 - 10.3 mg/dL 8.5(L)  Total Protein 6.5 - 8.1 g/dL -  Total Bilirubin 0.3 - 1.2 mg/dL -  Alkaline Phos 38 - 126 U/L -  AST 15 - 41 U/L -  ALT 0 - 44 U/L -   CBC Latest Ref Rng & Units 05/17/2019  WBC 4.0 - 10.5 K/uL 5.1  Hemoglobin 12.0 - 15.0 g/dL 7.6(L)  Hematocrit 36.0 - 46.0 % 25.1(L)  Platelets 150 - 400 K/uL 171    @IMAGES @  CT HEAD WO CONTRAST  Result Date: 05/17/2019 CLINICAL DATA:  Right handed weakness EXAM: CT HEAD WITHOUT CONTRAST TECHNIQUE: Contiguous axial images were obtained from the base of the skull through the vertex without intravenous contrast. COMPARISON:  05/15/2019 FINDINGS: Brain: No evidence of acute infarction, hemorrhage, hydrocephalus, extra-axial collection or mass lesion/mass effect. Extensive low-density changes within the periventricular and subcortical white matter compatible with chronic microvascular ischemic change. Mild diffuse cerebral volume loss. Vascular: Mild atherosclerotic calcifications involving the large vessels of the skull base. No unexpected hyperdense vessel. Skull: Normal. Negative for fracture or focal lesion. Sinuses/Orbits: Mild bilateral paranasal sinus mucosal thickening. Mastoid air cells are clear. Orbital structures unremarkable. Other: None. IMPRESSION: 1. No CT evidence of acute intracranial pathology. No interval change from prior 05/15/2019. 2. Chronic microvascular ischemic changes and cerebral volume loss. 3. Mild paranasal sinus disease. Electronically Signed   By: Davina Poke D.O.   On: 05/17/2019 11:20   CT Head Wo Contrast  Result Date:  05/15/2019 CLINICAL DATA:  Weakness EXAM: CT HEAD WITHOUT CONTRAST TECHNIQUE: Contiguous axial images were obtained from the base of the skull through the vertex without intravenous contrast. COMPARISON:  None. FINDINGS: Brain: There is no mass, hemorrhage or extra-axial collection. The size and configuration of the ventricles and extra-axial CSF spaces are normal. There is hypoattenuation of the white matter, most commonly indicating chronic small vessel disease. Vascular: Atherosclerotic calcification of the internal carotid arteries at the skull base. No abnormal hyperdensity of the major intracranial arteries or dural venous sinuses. Skull: The visualized skull base, calvarium and extracranial soft tissues are normal. Sinuses/Orbits: No fluid levels or advanced mucosal thickening of the visualized paranasal sinuses. No mastoid or middle ear effusion. The orbits are normal. IMPRESSION: Chronic small vessel disease without acute intracranial abnormality. Electronically Signed   By: Ulyses Jarred M.D.   On: 05/15/2019 20:39   US Carotid Bilateral (at [facility] and [facility] only)  Result Date: 05/16/2019 CLINICAL DATA:  86 year old female with a history of stroke EXAM: BILATERAL CAROTID DUPLEX ULTRASOUND TECHNIQUE: Pearline Cables scale imaging, color Doppler and duplex ultrasound were performed of bilateral carotid and vertebral arteries in the neck. COMPARISON:  None. FINDINGS: Criteria: Quantification of carotid stenosis is based on velocity parameters that correlate the residual internal carotid diameter with NASCET-based stenosis levels, using the diameter of the distal internal carotid lumen as the denominator for stenosis measurement. The following velocity measurements were obtained: RIGHT ICA:  Systolic Q000111Q cm/sec, Diastolic 21 cm/sec CCA:  A999333 cm/sec SYSTOLIC ICA/CCA RATIO:  0.8 ECA:  184 cm/sec LEFT ICA: N/a CCA:  99991111 cm/sec SYSTOLIC ICA/CCA RATIO:  N/a ECA:  298 cm/sec Right Brachial SBP: Not acquired Left Brachial SBP:  Not acquired RIGHT CAROTID ARTERY: Calcification of the right common carotid artery. Intermediate waveform maintained. Moderate heterogeneous and partially calcified plaque at the right carotid  bifurcation. Mild shadowing. Low resistance waveform of the right ICA. No significant tortuosity. RIGHT VERTEBRAL ARTERY: Antegrade flow with low resistance waveform. LEFT CAROTID ARTERY: Calcification of the left common carotid artery. High resistance waveform maintained of the CCA. Occlusion of the ICA at the bifurcation. LEFT VERTEBRAL ARTERY:  Antegrade flow with low resistance waveform. IMPRESSION: Right: Color duplex indicates moderate heterogeneous and calcified plaque, with no hemodynamically significant stenosis by duplex criteria in the extracranial cerebrovascular circulation. 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 Signed, Dulcy Fanny. Dellia Nims, RPVI [facility] Radiology Electronically Signed   By: Corrie Mckusick D.O.   On: 05/16/2019 12:35      Assessment and plan- Patient is a 86 y.o. female with newly diagnosed gastric adenocarcinoma involving the gastric cardia  1.  Discussed the results of the biopsy with the patient and her daughter.  Patient found to have partially obstructing mass in the gastric cardia which is not directly involve GE junction.  As such this would be considered as adenocarcinoma of the gastric cardia and can be treated the same way as a GE junction adenocarcinoma.  I would recommend concurrent chemoradiation with weekly carboplatin AUC 2 along with Taxol 50 mg per metered square.  She will also be seen by radiation oncology and I have personally spoken to Dr. Donella Stade who can see her tomorrow as an outpatient and plan for radiation simulation which would likely be given for 5 weeks.  This would help with her bleeding as well.I explained to the patient the risks and benefits of chemotherapy including all but not  limited to nausea, vomiting, low blood counts and risk of infection and hospitalization. Risk of peripheral neuropathy and allergic reaction associated with taxol. Patient understands and agrees to proceed as planned. Written information about chemotherapy has been given to the patient and written consent obtained from the patient.    Patient will need PET CT scan as an outpatient to see if she has evidence of distant metastatic disease.  There is no evidence of distant metastatic disease, I will get her evaluated by Dr. Doyle Askew at [facility] to see if she would be a surgical candidate which I doubt given her age.  Consideration may also need to be given for diagnostic peritoneal biopsy to rule out peritoneal disease if PET scan is negative we will arrange all this is an outpatient.   We will plan for chemo teach and port placement. Hold eliquis atleast until port is placed (this week) and following that I will restart eliquis given concern for acute ischemic event. Hopefully RT will start soon and stop her from bleeding again.    2. Iron deficiency anemia: She will get her second dose of Feraheme inpatient.     Total face to face encounter time for this patient visit was 40 min.  Total non face to face encounter time for this patient on the day of the visit was 10 min. Time spent in discussing patients care over the phone with DR. Chrystal   Visit Diagnosis 1. Right arm weakness   2. Permanent atrial fibrillation ([place])   3. Stroke [facility])     Dr. Randa Evens, MD, MPH [facility] at [facility] [id number] 05/17/2019  9:48 PM

## 2019-05-17 NOTE — Progress Notes (Signed)
Occupational Therapy Treatment Patient Details Name: Theresa Cain MRN: [id number] DOB: April 08, 1926 Today's Date: 05/17/2019    History of present illness Pt. is a 86 y.o. female who was admitted to [facility] with right sided weakness. CT Imaging revealed Chronic small vessel disease without acute intracranial abnormality; unable to complete MRI due to PPM. Pt. was hospitalized  05/10/19-05/15/19 with a GI Bleed. 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   OT comments  [name] was seen for OT treatment this date. Upon arrival to room pt seated upright in room recliner with dtr present at bedside. Pt agreeable to OT tx session, and endorses excitement over "getting some dough to play with" as pt and provider had planned to review thera-putty HEP and R hand strengthening activities earlier in the day. This therapist educates pt and caregiver on safe use of theraputty with written HEP provided. Therapist guides pt through full HEP giving direct modeling of thera-putty exercises. Pt then return demonstrates understanding of education provide by performing HEP x3 during session. Pt and provider discuss strategies for pet management and falls prevention upon hospital DC. Pt making good progress toward goals and continues to benefit from skilled OT services to maximize return to PLOF and minimize risk of future falls, injury, caregiver burden, and readmission. Will continue to follow POC. Discharge recommendation remains appropriate.     Follow Up Recommendations  Home health OT    Equipment Recommendations  Other (comment)(TBD)    Recommendations for Other Services      Precautions / Restrictions Precautions Precautions: Fall Restrictions Weight Bearing Restrictions: No       Mobility Bed Mobility Overal bed mobility: Modified Independent Bed Mobility: Supine to Sit     Supine to sit: Supervision     General bed  mobility comments: Deferred. Pt up in recliner at start/end of session. Per physical therapy note pt requires supervision for sup>sit this date with HOB elevated.  Transfers Overall transfer level: Needs assistance Equipment used: Rolling walker (2 wheeled) Transfers: Sit to/from Stand Sit to Stand: Min guard         General transfer comment: Deferred, per physical therapy note, pt requires min guard during functional transfer to recliner.    Balance Overall balance assessment: Needs assistance Sitting-balance support: No upper extremity supported;Feet supported Sitting balance-Leahy Scale: Good Sitting balance - Comments: Steady static sitting in recliner. Is able to come into long sitting w/o back support for ther-ex this date. No LOB appreciated with assessment.   Standing balance support: Bilateral upper extremity supported Standing balance-Leahy Scale: Fair Standing balance comment: Pt has unsteadiness during turning only. No LOB with intervention required.                           ADL either performed or assessed with clinical judgement   ADL Overall ADL's : Needs assistance/impaired                                       General ADL Comments: Pt reports RLE is moving even better this date, however her 9rd, 4th, and 5th digits on her R hand continue to have limited funciton. Pt states she continues to utilize her RUE as much as possible during functional tasks.     Vision Baseline Vision/History: Wears glasses Wears Glasses: Reading only Patient Visual Report:  No change from baseline     Perception     Praxis      Cognition Arousal/Alertness: Awake/alert Behavior During Therapy: WFL for tasks assessed/performed Overall Cognitive Status: Within Functional Limits for tasks assessed                                 General Comments: Pt is A and O x 4. Cooperative and pleasant throughout.        Exercises Other  Exercises Other Exercises: OT engages pt in ther-ex with use of red theraputty this date. Theraputty and handout dispensed during session. OT educates pt and caregiver (dtr at bedside) on theraputty exercises including rolling ball, rolling log, sequential pinching with RUE, digit extension with ring of putty, and flattening into pancake. Pt return demos HEP x3 during session.   Shoulder Instructions       General Comments      Pertinent Vitals/ Pain       Pain Assessment: No/denies pain  Home Living                                          Prior Functioning/Environment              Frequency  Min 2X/week        Progress Toward Goals  OT Goals(current goals can now be found in the care plan section)  Progress towards OT goals: Progressing toward goals  Acute Rehab OT Goals Patient Stated Goal: " I wanted to go home today but looks like tomorrow"  Plan Discharge plan remains appropriate;Frequency remains appropriate    Co-evaluation                 AM-PAC OT "6 Clicks" Daily Activity     Outcome Measure   Help from another person eating meals?: A Little Help from another person taking care of personal grooming?: A Little Help from another person toileting, which includes using toliet, bedpan, or urinal?: A Little Help from another person bathing (including washing, rinsing, drying)?: A Little Help from another person to put on and taking off regular upper body clothing?: A Little Help from another person to put on and taking off regular lower body clothing?: A Little 6 Click Score: 18    End of Session Equipment Utilized During Treatment: Other (comment)(Theraputty)  OT Visit Diagnosis: Other abnormalities of gait and mobility (R26.89);Hemiplegia and hemiparesis Hemiplegia - Right/Left: Right Hemiplegia - dominant/non-dominant: Dominant Hemiplegia - caused by: Unspecified   Activity Tolerance Patient tolerated treatment well    Patient Left in chair;with call bell/phone within reach;with chair alarm set;with family/visitor present   Nurse Communication          Time: [id number] OT Time Calculation (min): 24 min  Charges: OT General Charges $OT Visit: 1 Visit OT Treatments $Therapeutic Exercise: 23-37 mins  Shara Blazing, M.S., OTR/L Ascom: [phone] 05/17/19, 4:38 PM

## 2019-05-17 NOTE — Progress Notes (Signed)
Reason for consult: Right hand weakness  Subjective: Patient continues to have weakness in the right hand   ROS: negative except above   Examination  Vital signs in last 24 hours: Temp:  [97.6 F (36.4 C)-98 F (36.7 C)] 97.7 F (36.5 C) (02/23 0737) Pulse Rate:  [60-65] 65 (02/23 0903) Resp:  [14-18] 18 (02/23 0737) BP: (104-131)/(51-71) 104/56 (02/23 0903) SpO2:  [99 %] 99 % (02/23 0737)  General: lying in bed CVS: pulse-normal rate and rhythm RS: breathing comfortably Extremities: normal   Neuro: MS: Alert, oriented, follows commands CN: pupils equal and reactive,  EOMI, face symmetric, tongue midline, normal sensation over face, Motor: Right hand grip weak, normal flexion-extension of the elbow.  Right lower extremity and left upper and lower extremity normal strength Reflexes:  plantars: flexor Coordination: normal Gait: not tested  Basic Metabolic Panel: Recent Labs  Lab 05/11/19 0200 05/11/19 0200 05/12/19 0205 05/12/19 0205 05/15/19 0347 05/15/19 06-16-01 05/17/19 0540  NA 141  --  138  --  138 139 139  K 3.9  --  3.7  --  3.4* 4.0 3.9  CL 109  --  105  --  104 104 106  CO2 24  --  25  --  27 25 25   GLUCOSE 150*  --  135*  --  140* 165* 117*  BUN 19  --  19  --  10 14 11   CREATININE 1.19*  --  1.31*  --  1.41* 1.64* 1.36*  CALCIUM 8.3*   < > 8.0*   < > 8.2* 8.9 8.5*  MG  --   --   --   --   --   --  2.1   < > = values in this interval not displayed.    CBC: Recent Labs  Lab 05/12/19 0845 05/12/19 1628 05/13/19 1819 05/14/19 0804 05/15/19 0347 05/15/19 06-16-2101 05/17/19 0540  WBC 6.1  --   --  4.8 4.1 6.7 5.1  HGB 7.6*   < > 7.4* 7.3* 7.3* 7.8* 7.6*  HCT 24.6*   < > 24.4* 23.6* 23.6* 25.2* 25.1*  MCV 90.4  --   --  89.7 89.1 89.0 91.3  PLT 145*  --   --  143* 160 216 171   < > = values in this interval not displayed.     Coagulation Studies: Recent Labs    05/15/19 06-16-01  LABPROT 14.5  INR 1.1    Imaging Reviewed:  Carotid  Doppler Repeat CT head   ASSESSMENT AND PLAN  86 y.o. 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  On assessment, patient either has wrist drop due to Saturday night palsy-which is now improving, although she could have a " cortical hand" deficit due to a embolic stroke in the motor strip of the left frontal cortex.  Unfortunately cannot get MRI brain, recommend repeating CT head tomorrow which would increase sensitivity of detecting stroke compared to initial CT.   I am concerned over recurrent stroke risks as she is not on anticoagulation.  Her GI bleeding is severe and therefore we have no choice but to continue holding it.  Consider reconsulting GI tomorrow to see if there is any role in resection/embolization.  She is certainly  between a rock and a hard place.  Another option would be a left atrial appendage closure procedure by cardiology, however given her age and risk factor profile-I doubt she would be a candidate and family also does not seem to be interested.    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  Ideally will need to be on anticoagulation given her High CADS2VASC2 score, discuss with GI if ok to restart ASA -Frequent neurochecks -Continue statin -BP less than 140/90 mmHg -PT/OT  Outpatient follow-up with neurology in 2 to 4 weeks.  Karena Addison Alysabeth Scalia [facility] Pager Number [phone] For questions after 7pm please  refer to AMION to reach the Neurologist on call

## 2019-05-17 NOTE — Discharge Summary (Signed)
Physician Discharge Summary   Theresa Cain  female DOB: [birth year]  [id number]  PCP: Theresa Ranch, MD  Admit date: 05/15/2019 Discharge date: 05/17/2019  Admitted From: home Disposition:  home Home Health: Yes CODE STATUS: Full code  Discharge Instructions    Diet - low sodium heart healthy   Complete by: As directed    Discharge instructions   Complete by: As directed    Neurology would like you to take baby Aspirin 81 mg daily for stroke prevention, but hold your Eliquis until further notice from your outpatient doctors due to your GI bleeding.  Dr. Enzo Cain - -   Increase activity slowly   Complete by: As directed        Hospital Course:  For full details, please see H&P, progress notes, consult notes and ancillary notes.  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  Pt was just discharged early on the day of arrival following a hospitalization from 05/10/2019 to 05/15/2019 with severe anemia (Hb 6.6 from 13) secondary to upper GI bleedrequiring 2 units PRBC, withEGD on 05/13/19 showing circumferentialmass, oozing bloodseen in stomach (pathology pending).  Pt was discharged home with discontinuation of Eliquis and aspirin, who returned to the emergency room with complaints of weakness in her right wrist and handthat she noticed as she was leaving the hospital earlier in the day.     # Weakness right hand, improved # Acute ischemic stroke vs radial nerve palsy # Atrial fibrillation not on anticoagulation due to GI bleed Patient presenting withright hand weakness with  wrist drop but otherwise no neurologic deficits.  Not a candidate for TPA becausearrived outside TPA window.  CT head negative for acute intracranial process.  MRI can not be performed due to presence of pacemaker.  Neurology consult. rec repeat CT head which showed no acute finding.  Echocardiogram showed no shunt.  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   Neuro was consulted and was concerned over recurrent stroke risks as pt is not on anticoagulation.  Her GI bleeding was severe and therefore we have no choice but to continue holding systemic anticoagulation.  Considered left atrial appendage closure procedure by cardiology, and a message send to pt's EP for consideration.   Pt was started on ASA 81 daily and continued on statin.  Anemia due to GI blood loss Gastric tumoron EGD 05/13/2019, with history of stomach cancer 2013 Patient recently admitted with GI bleeding, as detailed above.  Hemoglobin stable at 7.8 on presentation.  Continued ferrous sulfate, PPI.  Continue to hold anticoagulation.  Chronic diastolic CHF (congestive heart failure) (HCC) Patient euvolemic at this time  HTN (hypertension) Continued diltiazem   Permanent atrial fibrillation (HCC) Eliquis has been discontinued due to recent GI bleed.  Continued diltiazem  Coronary artery disease involving native coronary artery of native heart History ofTAVR (transcatheter aortic valve replacement) No apparent acute issues.  Continued statin.  Hypothyroidism Continued levothyroxine  CKD (chronic kidney disease), stage IIIa Creatinine close to baseline.    Discharge Diagnoses:  Principal Problem:   Acute focal neurological deficit, onset within 3-24 hours Active Problems:   Chronic diastolic  CHF (congestive heart failure) (HCC)   HTN (hypertension)   Permanent atrial fibrillation (HCC)   Coronary artery disease involving  native coronary artery of native heart   S/P TAVR (transcatheter aortic valve replacement)   Hypothyroidism   Anemia due to GI blood loss   CKD (chronic kidney disease), stage IIIa   Gastric tumor   Stroke-like symptom    Discharge Instructions:  Allergies as of 05/17/2019      Reactions   Penicillins Hives, Other (See Comments)   PATIENT HAS HAD A PCN REACTION WITH IMMEDIATE RASH, FACIAL/TONGUE/THROAT SWELLING, SOB, OR LIGHTHEADEDNESS WITH HYPOTENSION:  #  #  #  YES  #  #  #   Has patient had a PCN reaction causing severe rash involving mucus membranes or skin necrosis: No Has patient had a PCN reaction that required hospitalization: No Has patient had a PCN reaction occurring within the last 10 years: No If all of the above answers are "NO", then may proceed with Cephalosporin use.   Sulfa Antibiotics Hives   Cymbalta [duloxetine Hcl]    Sweat, feels like having a heart attack       Medication List    TAKE these medications   acetaminophen 500 MG tablet Commonly known as: TYLENOL Take 1,000 mg by mouth every 6 (six) hours as needed for moderate pain.   aspirin EC 81 MG tablet Take 1 tablet (81 mg total) by mouth daily.   diltiazem 180 MG 24 hr capsule Commonly known as: CARDIZEM CD Take 1 capsule (180 mg total) by mouth daily.   Eliquis 2.5 MG Tabs tablet Generic drug: apixaban Hold until further notice per your outpatient doctor, due to your GI bleeding. What changed:   how much to take  how to take this  when to take this  additional instructions   escitalopram 10 MG tablet Commonly known as: LEXAPRO Take 10 mg by mouth at bedtime.   ferrous sulfate 325 (65 FE) MG tablet Take 325 mg by mouth daily with breakfast.   levothyroxine 50 MCG tablet Commonly known as: Synthroid Take 1 tablet (50 mcg total) by mouth daily.   omeprazole 40 MG capsule Commonly known as: PRILOSEC Take 1 capsule (40 mg total) by mouth 2 (two) times daily before a meal.     pravastatin 40 MG tablet Commonly known as: PRAVACHOL Take 40 mg by mouth every evening.   Vitamin D (Ergocalciferol) 1.25 MG (50000 UNIT) Caps capsule Commonly known as: DRISDOL Take 50,000 Units by mouth 2 (two) times a week. Take on Sunday and Thursday       Follow-up Information    Vasireddy, Lanetta Inch, MD. Schedule an appointment as soon as possible for a visit in 1 week(s).   Specialty: Internal Medicine Contact information: [place] [phone]        Constance Haw, MD .   Specialty: Cardiology Contact information: [address] [place] [address] [phone]        Sherren Mocha, MD .   Specialty: Cardiology Contact information: [id number] [place] [address] [phone]           Allergies  Allergen Reactions  . Penicillins Hives and Other (See Comments)    PATIENT HAS HAD A PCN REACTION WITH IMMEDIATE RASH, FACIAL/TONGUE/THROAT SWELLING, SOB, OR LIGHTHEADEDNESS WITH HYPOTENSION:  #  #  #  YES  #  #  #   Has patient had a PCN reaction  causing severe rash involving mucus membranes or skin necrosis: No Has patient had a PCN reaction that required hospitalization: No Has patient had a PCN reaction occurring within the last 10 years: No If all of the above answers are "NO", then may proceed with Cephalosporin use.   . Sulfa Antibiotics Hives  . Cymbalta [Duloxetine Hcl]     Sweat, feels like having a heart attack      The results of significant diagnostics from this hospitalization (including imaging, microbiology, ancillary and laboratory) are listed below for reference.   Consultations:   Procedures/Studies: CT HEAD WO CONTRAST  Result Date: 05/17/2019 CLINICAL DATA:  Right handed weakness EXAM: CT HEAD WITHOUT CONTRAST TECHNIQUE: Contiguous axial images were obtained from the base of the skull through the vertex without intravenous contrast. COMPARISON:   05/15/2019 FINDINGS: Brain: No evidence of acute infarction, hemorrhage, hydrocephalus, extra-axial collection or mass lesion/mass effect. Extensive low-density changes within the periventricular and subcortical white matter compatible with chronic microvascular ischemic change. Mild diffuse cerebral volume loss. Vascular: Mild atherosclerotic calcifications involving the large vessels of the skull base. No unexpected hyperdense vessel. Skull: Normal. Negative for fracture or focal lesion. Sinuses/Orbits: Mild bilateral paranasal sinus mucosal thickening. Mastoid air cells are clear. Orbital structures unremarkable. Other: None. IMPRESSION: 1. No CT evidence of acute intracranial pathology. No interval change from prior 05/15/2019. 2. Chronic microvascular ischemic changes and cerebral volume loss. 3. Mild paranasal sinus disease. Electronically Signed   By: Davina Poke D.O.   On: 05/17/2019 11:20   CT Head Wo Contrast  Result Date: 05/15/2019 CLINICAL DATA:  Weakness EXAM: CT HEAD WITHOUT CONTRAST TECHNIQUE: Contiguous axial images were obtained from the base of the skull through the vertex without intravenous contrast. COMPARISON:  None. FINDINGS: Brain: There is no mass, hemorrhage or extra-axial collection. The size and configuration of the ventricles and extra-axial CSF spaces are normal. There is hypoattenuation of the white matter, most commonly indicating chronic small vessel disease. Vascular: Atherosclerotic calcification of the internal carotid arteries at the skull base. No abnormal hyperdensity of the major intracranial arteries or dural venous sinuses. Skull: The visualized skull base, calvarium and extracranial soft tissues are normal. Sinuses/Orbits: No fluid levels or advanced mucosal thickening of the visualized paranasal sinuses. No mastoid or middle ear effusion. The orbits are normal. IMPRESSION: Chronic small vessel disease without acute intracranial abnormality. Electronically Signed    By: Ulyses Jarred M.D.   On: 05/15/2019 20:39   US Carotid Bilateral (at [facility] and [facility] only)  Result Date: 05/16/2019 CLINICAL DATA:  86 year old female with a history of stroke EXAM: BILATERAL CAROTID DUPLEX ULTRASOUND TECHNIQUE: Pearline Cables scale imaging, color Doppler and duplex ultrasound were performed of bilateral carotid and vertebral arteries in the neck. COMPARISON:  None. FINDINGS: Criteria: Quantification of carotid stenosis is based on velocity parameters that correlate the residual internal carotid diameter with NASCET-based stenosis levels, using the diameter of the distal internal carotid lumen as the denominator for stenosis measurement. The following velocity measurements were obtained: RIGHT ICA:  Systolic Q000111Q cm/sec, Diastolic 21 cm/sec CCA:  A999333 cm/sec SYSTOLIC ICA/CCA RATIO:  0.8 ECA:  184 cm/sec LEFT ICA: N/a CCA:  99991111 cm/sec SYSTOLIC ICA/CCA RATIO:  N/a ECA:  298 cm/sec Right Brachial SBP: Not acquired Left Brachial SBP: Not acquired RIGHT CAROTID ARTERY: Calcification of the right common carotid artery. Intermediate waveform maintained. Moderate heterogeneous and partially calcified plaque at the right carotid bifurcation. Mild shadowing. Low resistance waveform of the right ICA. No significant tortuosity. RIGHT  VERTEBRAL ARTERY: Antegrade flow with low resistance waveform. LEFT CAROTID ARTERY: Calcification of the left common carotid artery. High resistance waveform maintained of the CCA. Occlusion of the ICA at the bifurcation. LEFT VERTEBRAL ARTERY:  Antegrade flow with low resistance waveform. IMPRESSION: Right: Color duplex indicates moderate heterogeneous and calcified plaque, with no hemodynamically significant stenosis by duplex criteria in the extracranial cerebrovascular circulation. 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 Signed, Dulcy Fanny. Dellia Nims, RPVI [facility] Electronically Signed   By: Corrie Mckusick D.O.   On: 05/16/2019 12:35   ECHOCARDIOGRAM COMPLETE  Result Date: 05/17/2019    ECHOCARDIOGRAM REPORT   Patient Name:   ADASHIA MACARAEG Date of Exam: 05/17/2019 Medical Rec #:  [id number]   Height:       72.0 in Accession #:    [id number]  Weight:       195.8 lb Date of Birth:  April 02, 1926   BSA:          2.111 m Patient Age:    65 years    BP:           104/56 mmHg Patient Gender: F           HR:           62 bpm. Exam Location:  [facility] Procedure: 2D Echo, Color Doppler and Cardiac Doppler Indications:     I163.9 Stroke  History:         Patient has prior history of Echocardiogram examinations. CAD,                  Pacemaker, PVD, CKD, Aortic Valve Disease; Risk                  Factors:Hypertension and Dyslipidemia.  Sonographer:     Charmayne Sheer RDCS (AE) Referring Phys:  [id number] Athena Masse Diagnosing Phys: Nelva Bush MD  Sonographer Comments: Suboptimal parasternal window. IMPRESSIONS  1. Left ventricular ejection fraction, by estimation, is 65 to 70%. The left ventricle has normal function. The left ventricle has no regional wall motion abnormalities. There is mild left ventricular hypertrophy. Left ventricular diastolic parameters are indeterminate.  2. Right ventricular systolic function is normal. The right ventricular size is normal. There is moderately elevated pulmonary artery systolic pressure.  3. Left atrial size was moderately dilated.  4. Right atrial size was mildly dilated.  5. The mitral valve is grossly normal. Mild to moderate mitral valve regurgitation. No evidence of mitral stenosis.  6. Tricuspid valve regurgitation is mild to moderate.  7. The aortic valve has been repaired/replaced. Aortic valve regurgitation is not visualized. Aortic valve mean gradient measures 22.0 mmHg.  8. The inferior vena cava is normal in size with <50% respiratory variability, suggesting right atrial pressure of 8 mmHg. FINDINGS  Left Ventricle: Left ventricular ejection fraction, by estimation, is 65  to 70%. The left ventricle has normal function. The left ventricle has no regional wall motion abnormalities. The left ventricular internal cavity size was normal in size. There is  mild left ventricular hypertrophy. Left ventricular diastolic parameters are indeterminate. Right Ventricle: The right ventricular size is normal. No increase in right ventricular wall thickness. Right ventricular systolic function is normal. There is moderately elevated pulmonary artery systolic pressure. The tricuspid regurgitant velocity is 2.90 m/s, and with an assumed right atrial pressure of 8 mmHg, the estimated right ventricular systolic pressure is 99991111 mmHg. Left Atrium: Left atrial size was moderately dilated.  Right Atrium: Right atrial size was mildly dilated. Pericardium: There is no evidence of pericardial effusion. Mitral Valve: The mitral valve is grossly normal. Mild mitral annular calcification. Mild to moderate mitral valve regurgitation. No evidence of mitral valve stenosis. MV peak gradient, 10.1 mmHg. The mean mitral valve gradient is 3.0 mmHg. Tricuspid Valve: The tricuspid valve is not well visualized. Tricuspid valve regurgitation is mild to moderate. Aortic Valve: There is no significant paravalvular leak. The aortic valve has been repaired/replaced. Aortic valve regurgitation is not visualized. Aortic valve mean gradient measures 22.0 mmHg. Aortic valve peak gradient measures 38.4 mmHg. Aortic valve  area, by VTI measures 0.99 cm. There is a 26 mm Edwards Sapien prosthetic, stented (TAVR) valve present in the aortic position. Pulmonic Valve: The pulmonic valve was not well visualized. Pulmonic valve regurgitation is not visualized. No evidence of pulmonic stenosis. Aorta: The aortic root is normal in size and structure. Pulmonary Artery: The pulmonary artery is not well seen. Venous: The inferior vena cava is normal in size with less than 50% respiratory variability, suggesting right atrial pressure of 8 mmHg.  IAS/Shunts: No atrial level shunt detected by color flow Doppler. Additional Comments: A pacer wire is visualized in the right ventricle.  LEFT VENTRICLE PLAX 2D LVIDd:         4.98 cm  Diastology LVIDs:         3.35 cm  LV e' lateral:   5.11 cm/s LV PW:         1.08 cm  LV E/e' lateral: 26.7 LV IVS:        1.05 cm  LV e' medial:    7.29 cm/s LVOT diam:     1.80 cm  LV E/e' medial:  18.7 LV SV:         64 LV SV Index:   30 LVOT Area:     2.54 cm  RIGHT VENTRICLE RV Basal diam:  3.63 cm LEFT ATRIUM              Index       RIGHT ATRIUM           Index LA diam:        5.80 cm  2.75 cm/m  RA Area:     18.90 cm LA Vol (A2C):   127.0 ml 60.15 ml/m RA Volume:   50.00 ml  23.68 ml/m LA Vol (A4C):   81.1 ml  38.41 ml/m LA Biplane Vol: 101.0 ml 47.84 ml/m  AORTIC VALVE                    PULMONIC VALVE AV Area (Vmax):    0.93 cm     PV Vmax:       1.13 m/s AV Area (Vmean):   0.91 cm     PV Vmean:      72.000 cm/s AV Area (VTI):     0.99 cm     PV VTI:        0.209 m AV Vmax:           310.00 cm/s  PV Peak grad:  5.1 mmHg AV Vmean:          222.000 cm/s PV Mean grad:  2.0 mmHg AV VTI:            0.645 m AV Peak Grad:      38.4 mmHg AV Mean Grad:      22.0 mmHg LVOT Vmax:  113.00 cm/s LVOT Vmean:        79.800 cm/s LVOT VTI:          0.252 m LVOT/AV VTI ratio: 0.39  AORTA Ao Root diam: 2.60 cm MITRAL VALVE                TRICUSPID VALVE MV Area (PHT): 2.66 cm     TR Peak grad:   33.6 mmHg MV Peak grad:  10.1 mmHg    TR Vmax:        290.00 cm/s MV Mean grad:  3.0 mmHg MV Vmax:       1.59 m/s     SHUNTS MV Vmean:      74.7 cm/s    Systemic VTI:  0.25 m MV Decel Time: 286 msec     Systemic Diam: 1.80 cm MV E velocity: 136.50 cm/s Nelva Bush MD Electronically signed by Nelva Bush MD Signature Date/Time: 05/17/2019/4:45:01 PM    Final       Labs: BNP (last 3 results) Recent Labs    05/10/19 1156  BNP Q000111Q*   Basic Metabolic Panel: Recent Labs  Lab 05/11/19 0200 05/12/19 0205  05/15/19 0347 05/15/19 2103 05/17/19 0540  NA 141 138 138 139 139  K 3.9 3.7 3.4* 4.0 3.9  CL 109 105 104 104 106  CO2 24 25 27 25 25   GLUCOSE 150* 135* 140* 165* 117*  BUN 19 19 10 14 11   CREATININE 1.19* 1.31* 1.41* 1.64* 1.36*  CALCIUM 8.3* 8.0* 8.2* 8.9 8.5*  MG  --   --   --   --  2.1   Liver Function Tests: Recent Labs  Lab 05/15/19 2103  AST 14*  ALT 8  ALKPHOS 59  BILITOT 0.6  PROT 6.5  ALBUMIN 3.4*   No results for input(s): LIPASE, AMYLASE in the last 168 hours. No results for input(s): AMMONIA in the last 168 hours. CBC: Recent Labs  Lab 05/12/19 0845 05/12/19 1628 05/13/19 1819 05/14/19 0804 05/15/19 0347 05/15/19 2103 05/17/19 0540  WBC 6.1  --   --  4.8 4.1 6.7 5.1  HGB 7.6*   < > 7.4* 7.3* 7.3* 7.8* 7.6*  HCT 24.6*   < > 24.4* 23.6* 23.6* 25.2* 25.1*  MCV 90.4  --   --  89.7 89.1 89.0 91.3  PLT 145*  --   --  143* 160 216 171   < > = values in this interval not displayed.   Cardiac Enzymes: No results for input(s): CKTOTAL, CKMB, CKMBINDEX, TROPONINI in the last 168 hours. BNP: Invalid input(s): POCBNP CBG: Recent Labs  Lab 05/11/19 1155 05/11/19 1637 05/12/19 0818 05/13/19 0740 05/14/19 0934  GLUCAP 127* 129* 125* 135* 175*   D-Dimer No results for input(s): DDIMER in the last 72 hours. Hgb A1c Recent Labs    05/16/19 0026  HGBA1C 4.5*   Lipid Profile Recent Labs    05/16/19 0026  CHOL 121  HDL 41  LDLCALC 58  TRIG 112  CHOLHDL 3.0   Thyroid function studies No results for input(s): TSH, T4TOTAL, T3FREE, THYROIDAB in the last 72 hours.  Invalid input(s): FREET3 Anemia work up No results for input(s): VITAMINB12, FOLATE, FERRITIN, TIBC, IRON, RETICCTPCT in the last 72 hours. Urinalysis    Component Value Date/Time   COLORURINE YELLOW (A) 05/16/2019 0900   APPEARANCEUR CLEAR (A) 05/16/2019 0900   LABSPEC 1.009 05/16/2019 0900   PHURINE 5.0 05/16/2019 0900   GLUCOSEU NEGATIVE 05/16/2019 0900   HGBUR NEGATIVE  05/16/2019 0900   BILIRUBINUR NEGATIVE 05/16/2019 0900   KETONESUR NEGATIVE 05/16/2019 0900   PROTEINUR NEGATIVE 05/16/2019 0900   NITRITE NEGATIVE 05/16/2019 0900   LEUKOCYTESUR MODERATE (A) 05/16/2019 0900   Sepsis Labs Invalid input(s): PROCALCITONIN,  WBC,  LACTICIDVEN Microbiology Recent Results (from the past 240 hour(s))  SARS CORONAVIRUS 2 (TAT 6-24 HRS) Nasopharyngeal Nasopharyngeal Swab     Status: None   Collection Time: 05/10/19  2:36 PM   Specimen: Nasopharyngeal Swab  Result Value Ref Range Status   SARS Coronavirus 2 NEGATIVE NEGATIVE Final    Comment: (NOTE) SARS-CoV-2 target nucleic acids are NOT DETECTED. The SARS-CoV-2 RNA is generally detectable in upper and lower respiratory specimens during the acute phase of infection. Negative results do not preclude SARS-CoV-2 infection, do not rule out co-infections with other pathogens, and should not be used as the sole basis for treatment or other patient management decisions. Negative results must be combined with clinical observations, patient history, and epidemiological information. The expected result is Negative. Fact Sheet for Patients: SugarRoll.be Fact Sheet for Healthcare Providers: https://www.woods-mathews.com/ This test is not yet approved or cleared by the [place] FDA and  has been authorized for detection and/or diagnosis of SARS-CoV-2 by FDA under an Emergency Use Authorization (EUA). This EUA will remain  in effect (meaning this test can be used) for the duration of the COVID-19 declaration under Section 56 4(b)(1) of the Act, 21 U.S.C. section 360bbb-3(b)(1), unless the authorization is terminated or revoked sooner. Performed at [facility], [place] [address] [place], [place] [address]   SARS CORONAVIRUS 2 (TAT 6-24 HRS) Nasopharyngeal Nasopharyngeal Swab     Status: None   Collection Time: 05/15/19  8:40 PM   Specimen: Nasopharyngeal Swab   Result Value Ref Range Status   SARS Coronavirus 2 NEGATIVE NEGATIVE Final    Comment: (NOTE) SARS-CoV-2 target nucleic acids are NOT DETECTED. The SARS-CoV-2 RNA is generally detectable in upper and lower respiratory specimens during the acute phase of infection. Negative results do not preclude SARS-CoV-2 infection, do not rule out co-infections with other pathogens, and should not be used as the sole basis for treatment or other patient management decisions. Negative results must be combined with clinical observations, patient history, and epidemiological information. The expected result is Negative. Fact Sheet for Patients: SugarRoll.be Fact Sheet for Healthcare Providers: https://www.woods-mathews.com/ This test is not yet approved or cleared by the [place] FDA and  has been authorized for detection and/or diagnosis of SARS-CoV-2 by FDA under an Emergency Use Authorization (EUA). This EUA will remain  in effect (meaning this test can be used) for the duration of the COVID-19 declaration under Section 56 4(b)(1) of the Act, 21 U.S.C. section 360bbb-3(b)(1), unless the authorization is terminated or revoked sooner. Performed at [facility], [place] [address], [place], [place] [address]      Total time spend on discharging this patient, including the last patient exam, discussing the hospital stay, instructions for ongoing care as it relates to all pertinent caregivers, as well as preparing the medical discharge records, prescriptions, and/or referrals as applicable, is 40 minutes.    Theresa Bi, MD  [facility] 05/17/2019, 5:01 PM  If 7PM-7AM, please contact night-coverage

## 2019-05-18 ENCOUNTER — Other Ambulatory Visit (INDEPENDENT_AMBULATORY_CARE_PROVIDER_SITE_OTHER): Payer: Self-pay | Admitting: Nurse Practitioner

## 2019-05-18 ENCOUNTER — Ambulatory Visit
Admission: RE | Admit: 2019-05-18 | Discharge: 2019-05-18 | Disposition: A | Discharge status: Home / Self Care | Payer: Medicare Other | Source: Ambulatory Visit | Attending: Oncology | Admitting: Oncology

## 2019-05-18 ENCOUNTER — Other Ambulatory Visit: Payer: Self-pay

## 2019-05-18 ENCOUNTER — Ambulatory Visit: Payer: Medicare Other | Admitting: Radiation Oncology

## 2019-05-18 DIAGNOSIS — Z9049 Acquired absence of other specified parts of digestive tract: Secondary | ICD-10-CM | POA: Insufficient documentation

## 2019-05-18 DIAGNOSIS — I517 Cardiomegaly: Secondary | ICD-10-CM | POA: Diagnosis not present

## 2019-05-18 DIAGNOSIS — C169 Malignant neoplasm of stomach, unspecified: Secondary | ICD-10-CM

## 2019-05-18 DIAGNOSIS — I700 Atherosclerosis of aorta: Secondary | ICD-10-CM | POA: Diagnosis not present

## 2019-05-18 DIAGNOSIS — Z950 Presence of cardiac pacemaker: Secondary | ICD-10-CM | POA: Insufficient documentation

## 2019-05-18 DIAGNOSIS — J90 Pleural effusion, not elsewhere classified: Secondary | ICD-10-CM | POA: Insufficient documentation

## 2019-05-18 DIAGNOSIS — K5730 Diverticulosis of large intestine without perforation or abscess without bleeding: Secondary | ICD-10-CM | POA: Insufficient documentation

## 2019-05-18 DIAGNOSIS — I77810 Thoracic aortic ectasia: Secondary | ICD-10-CM | POA: Insufficient documentation

## 2019-05-18 LAB — GLUCOSE, CAPILLARY: Glucose-Capillary: 83 mg/dL (ref 70–99)

## 2019-05-18 MED ORDER — FLUDEOXYGLUCOSE F - 18 (FDG) INJECTION
10.1000 | Freq: Once | INTRAVENOUS | Status: AC | PRN
  Administered 2019-05-18: 11.01 via INTRAVENOUS

## 2019-05-19 ENCOUNTER — Encounter: Payer: Self-pay | Admitting: Oncology

## 2019-05-19 ENCOUNTER — Ambulatory Visit: Payer: Medicare Other

## 2019-05-19 ENCOUNTER — Other Ambulatory Visit: Payer: Self-pay

## 2019-05-19 ENCOUNTER — Encounter: Admission: RE | Payer: Self-pay | Source: Home / Self Care

## 2019-05-19 ENCOUNTER — Ambulatory Visit
Admission: RE | Admit: 2019-05-19 | Discharge: 2019-05-19 | Disposition: A | Discharge status: Home / Self Care | Payer: Medicare Other | Source: Ambulatory Visit | Attending: Radiation Oncology | Admitting: Radiation Oncology

## 2019-05-19 ENCOUNTER — Ambulatory Visit: Admission: RE | Admit: 2019-05-19 | Payer: Medicare Other | Source: Home / Self Care | Admitting: Vascular Surgery

## 2019-05-19 ENCOUNTER — Inpatient Hospital Stay: Payer: Medicare Other | Attending: Oncology | Admitting: Oncology

## 2019-05-19 VITALS — BP 149/58 | HR 63 | Temp 98.7°F | Wt 198.0 lb

## 2019-05-19 DIAGNOSIS — Z7189 Other specified counseling: Secondary | ICD-10-CM | POA: Diagnosis not present

## 2019-05-19 DIAGNOSIS — C160 Malignant neoplasm of cardia: Secondary | ICD-10-CM | POA: Insufficient documentation

## 2019-05-19 DIAGNOSIS — Z803 Family history of malignant neoplasm of breast: Secondary | ICD-10-CM | POA: Diagnosis not present

## 2019-05-19 DIAGNOSIS — Z7982 Long term (current) use of aspirin: Secondary | ICD-10-CM | POA: Diagnosis not present

## 2019-05-19 DIAGNOSIS — Z952 Presence of prosthetic heart valve: Secondary | ICD-10-CM | POA: Insufficient documentation

## 2019-05-19 DIAGNOSIS — D649 Anemia, unspecified: Secondary | ICD-10-CM | POA: Insufficient documentation

## 2019-05-19 DIAGNOSIS — D509 Iron deficiency anemia, unspecified: Secondary | ICD-10-CM | POA: Diagnosis not present

## 2019-05-19 DIAGNOSIS — I4821 Permanent atrial fibrillation: Secondary | ICD-10-CM | POA: Insufficient documentation

## 2019-05-19 DIAGNOSIS — Z79899 Other long term (current) drug therapy: Secondary | ICD-10-CM | POA: Insufficient documentation

## 2019-05-19 DIAGNOSIS — Z7901 Long term (current) use of anticoagulants: Secondary | ICD-10-CM | POA: Insufficient documentation

## 2019-05-19 DIAGNOSIS — N189 Chronic kidney disease, unspecified: Secondary | ICD-10-CM | POA: Diagnosis not present

## 2019-05-19 DIAGNOSIS — I130 Hypertensive heart and chronic kidney disease with heart failure and stage 1 through stage 4 chronic kidney disease, or unspecified chronic kidney disease: Secondary | ICD-10-CM | POA: Diagnosis not present

## 2019-05-19 DIAGNOSIS — I639 Cerebral infarction, unspecified: Secondary | ICD-10-CM

## 2019-05-19 DIAGNOSIS — R001 Bradycardia, unspecified: Secondary | ICD-10-CM | POA: Diagnosis not present

## 2019-05-19 DIAGNOSIS — C169 Malignant neoplasm of stomach, unspecified: Secondary | ICD-10-CM

## 2019-05-19 SURGERY — PORTA CATH INSERTION
Anesthesia: Moderate Sedation

## 2019-05-19 NOTE — Progress Notes (Signed)
Met with Theresa Cain and her daughter. Introduced Therapist, nutritional and provided contact information for future needs. With her port being cancelled today, Dr. Baruch Gouty will proceed with simulation. Plan of care reviewed with Theresa Cain. She has cancer center main number to call in the case of bleeding following Theresa Cain restart on Sunday.

## 2019-05-19 NOTE — Progress Notes (Signed)
Patient is here today to establish care for stomach cancer.

## 2019-05-19 NOTE — Consult Note (Signed)
NEW PATIENT EVALUATION  Name: Theresa Cain  MRN: [id number]  Date:   05/19/2019     DOB: 1926-08-13   This 86 y.o. female patient presents to the clinic for initial evaluation of gastric adenocarcinoma.  REFERRING PHYSICIAN: Vasireddy, Venugopal Ki*  CHIEF COMPLAINT:  Chief Complaint  Patient presents with  . Cancer    Initial consultation    DIAGNOSIS: The encounter diagnosis was Gastric adenocarcinoma ([place]).   PREVIOUS INVESTIGATIONS:  CT scans PET CT scans reviewed Pathology report reviewed Clinical notes reviewed  HPI: Patient is a 86 year old female with a prior history of neuroendocrine tumor status post Whipple procedure in 2014.  She presented now with a gastric cardia mass and acute upper GI bleed.  She underwent hemospray which has controlled the bleeding at this time.  She underwent upper endoscopy showing a large polypoid partially circumferential mass involving one half of the circumference of the cardia with oozing and bleeding.  Biopsies were positive for invasive adenocarcinoma intestinal type.  PET scan showed intensely hypermetabolic posterior gastric cardia mass compatible with known primary gastric carcinoma.  She had mild hypermetabolic activity with a small gastrohepatic ligament lymph node near the cardia.  No other hypermetabolic metastatic disease was noted.  She has multiple medical comorbidities including congestive heart failure anemia brachycardia carotid artery occlusion chronic renal disease has a pacemaker placed and has permanent atrial fibrillation.  She has been seen by medical oncology and has declined chemotherapy.  She is now referred to radiation oncology for consideration of treatment.  Today she is doing well she is in no pain.  She states the blood per stools has stopped.  She is having no nausea at this time  PLANNED TREATMENT REGIMEN: IMRT treatment planning and delivery to her gastric cardia  PAST MEDICAL HISTORY:  has a past medical history of  Acute on chronic diastolic CHF (congestive heart failure) ([place]) (02/22/2016), Anemia, Bradycardia, Carotid artery occlusion, Chronic diastolic CHF (congestive heart failure) ([place]), Chronic kidney disease, Coronary artery disease involving native coronary artery of native heart, GERD (gastroesophageal reflux disease), HLD (hyperlipidemia), Hypertension, Hypothyroidism, Obesity, Osteoarthritis, Permanent atrial fibrillation ([place]), Presence of permanent cardiac pacemaker, PVD (peripheral vascular disease) ([place]), 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    PAST SURGICAL HISTORY:  Past Surgical History:  Procedure Laterality Date  . CARDIOVASCULAR STRESS TEST    . CARDIOVERSION N/A 08/08/2016   Procedure: CARDIOVERSION;  Surgeon: Sueanne Margarita, MD;  Location: [facility] ENDOSCOPY;  Service: Cardiovascular;  Laterality: N/A;  . CAROTID ENDARTERECTOMY Right ~ 2005  . CATARACT EXTRACTION W/ INTRAOCULAR LENS IMPLANT Right   . CHOLECYSTECTOMY  ~ 2013   "part of her Whipple OR"  . EP IMPLANTABLE DEVICE N/A 03/04/2016   Procedure: Pacemaker Implant;  Surgeon: Will Meredith Leeds, MD;  Location: [facility] LAB;  Service: Cardiovascular;  Laterality: N/A;  . EP IMPLANTABLE DEVICE N/A 03/05/2016   Procedure: Lead Revision/Repair;  Surgeon: Deboraha Sprang, MD;  Location: [facility];  Service: Cardiovascular;  Laterality: N/A;  . ESOPHAGOGASTRODUODENOSCOPY (EGD) WITH PROPOFOL N/A 05/13/2019   Procedure: ESOPHAGOGASTRODUODENOSCOPY (EGD) WITH PROPOFOL;  Surgeon: Jonathon Bellows, MD;  Location: [facility] ENDOSCOPY;  Service: Gastroenterology;  Laterality: N/A;  . INSERT / REPLACE / REMOVE PACEMAKER    . LEFT HEART CATH AND CORONARY ANGIOGRAPHY N/A 03/27/2017   Procedure: LEFT HEART CATH AND CORONARY ANGIOGRAPHY;  Surgeon: Minna Merritts, MD;  Location: [facility] LAB;  Service:  Cardiovascular;  Laterality: N/A;  . TEE WITHOUT  CARDIOVERSION N/A 05/12/2017   Procedure: TRANSESOPHAGEAL ECHOCARDIOGRAM (TEE);  Surgeon: Sherren Mocha, MD;  Location: [facility];  Service: Open Heart Surgery;  Laterality: N/A;  . TRANSCATHETER AORTIC VALVE REPLACEMENT, TRANSFEMORAL N/A 05/12/2017   Procedure: TRANSCATHETER AORTIC VALVE REPLACEMENT, TRANSFEMORAL using a 24mm Edwards Sapien 3 Aortic Valve;  Surgeon: Sherren Mocha, MD;  Location: [facility];  Service: Open Heart Surgery;  Laterality: N/A;  . US ECHOCARDIOGRAPHY    . VAGINAL HYSTERECTOMY    . WHIPPLE PROCEDURE  ~ 2013    FAMILY HISTORY: family history includes Breast cancer in her sister; Cerebral aneurysm in her mother; Diabetes Mellitus II in her sister; Healthy in her father; Heart disease in her sister; Lung cancer in her brother; Throat cancer in her brother.  SOCIAL HISTORY:  reports that she has never smoked. She has never used smokeless tobacco. She reports that she does not drink alcohol or use drugs.  ALLERGIES: Penicillins, Sulfa antibiotics, and Cymbalta [duloxetine hcl]  MEDICATIONS:  Current Outpatient Medications  Medication Sig Dispense Refill  . acetaminophen (TYLENOL) 500 MG tablet Take 1,000 mg by mouth every 6 (six) hours as needed for moderate pain.    Marland Kitchen aspirin EC 81 MG tablet Take 1 tablet (81 mg total) by mouth daily.    Marland Kitchen diltiazem (CARDIZEM CD) 180 MG 24 hr capsule Take 1 capsule (180 mg total) by mouth daily. 90 capsule 3  . ELIQUIS 2.5 MG TABS tablet Hold until further notice per your outpatient doctor, due to your GI bleeding. (Patient not taking: Reported on 05/19/2019) 60 tablet   . escitalopram (LEXAPRO) 10 MG tablet Take 10 mg by mouth at bedtime.     . ferrous sulfate 325 (65 FE) MG tablet Take 325 mg by mouth daily with breakfast.    . levothyroxine (SYNTHROID) 50 MCG tablet Take 1 tablet (50 mcg total) by mouth daily. 30 tablet 0  . omeprazole (PRILOSEC) 40 MG capsule Take 1 capsule (40 mg total) by mouth 2 (two) times daily before a meal. 60  capsule 0  . pravastatin (PRAVACHOL) 40 MG tablet Take 40 mg by mouth every evening.     . Vitamin D, Ergocalciferol, (DRISDOL) 50000 units CAPS capsule Take 50,000 Units by mouth 2 (two) times a week. Take on Sunday and Thursday     No current facility-administered medications for this encounter.    ECOG PERFORMANCE STATUS:  1 - Symptomatic but completely ambulatory  REVIEW OF SYSTEMS: Patient denies any weight loss, fatigue, weakness, fever, chills or night sweats. Patient denies any loss of vision, blurred vision. Patient denies any ringing  of the ears or hearing loss. No irregular heartbeat. Patient denies heart murmur or history of fainting. Patient denies any chest pain or pain radiating to her upper extremities. Patient denies any shortness of breath, difficulty breathing at night, cough or hemoptysis. Patient denies any swelling in the lower legs. Patient denies any nausea vomiting, vomiting of blood, or coffee ground material in the vomitus. Patient denies any stomach pain. Patient states has had normal bowel movements no significant constipation or diarrhea. Patient denies any dysuria, hematuria or significant nocturia. Patient denies any problems walking, swelling in the joints or loss of balance. Patient denies any skin changes, loss of hair or loss of weight. Patient denies any excessive worrying or anxiety or significant depression. Patient denies any problems with insomnia. Patient denies excessive thirst, polyuria, polydipsia. Patient denies any swollen glands, patient denies easy bruising  or easy bleeding. Patient denies any recent infections, allergies or URI. Patient "s visual fields have not changed significantly in recent time.   PHYSICAL EXAM: There were no vitals taken for this visit. Well-developed well-nourished patient in NAD. HEENT reveals PERLA, EOMI, discs not visualized.  Oral cavity is clear. No oral mucosal lesions are identified. Neck is clear without evidence of  cervical or supraclavicular adenopathy. Lungs are clear to A&P. Cardiac examination is essentially unremarkable with regular rate and rhythm without murmur rub or thrill. Abdomen is benign with no organomegaly or masses noted. Motor sensory and DTR levels are equal and symmetric in the upper and lower extremities. Cranial nerves II through XII are grossly intact. Proprioception is intact. No peripheral adenopathy or edema is identified. No motor or sensory levels are noted. Crude visual fields are within normal range.  LABORATORY DATA: Pathology report reviewed    RADIOLOGY RESULTS: PET scan reviewed compatible with above-stated findings   IMPRESSION: Gastric cardia adenocarcinoma in 86 year old female with multiple medical comorbidities  PLAN: At this time I have recommended radiation therapy to her gastric cardia.  Will plan delivering 5400 cGy in 30 fractions using IMRT treatment planning and delivery.  Patient has declined chemotherapy based on her advanced age and multiple medical comorbidities.  I have opted to simulate her today and start as early as possible next week to prevent further bleeding.  Risks and benefits of treatment occluding possible nausea fatigue alteration of blood count skin reaction all were discussed in detail with the patient and her daughter.  Patient comprehends my treatment plan well.  I would like to take this opportunity to thank you for allowing me to participate in the care of your patient.Noreene Filbert, MD

## 2019-05-20 ENCOUNTER — Inpatient Hospital Stay: Payer: Medicare Other

## 2019-05-20 ENCOUNTER — Inpatient Hospital Stay: Payer: Medicare Other | Admitting: Oncology

## 2019-05-20 ENCOUNTER — Ambulatory Visit: Payer: Medicare Other

## 2019-05-20 DIAGNOSIS — C160 Malignant neoplasm of cardia: Secondary | ICD-10-CM | POA: Diagnosis not present

## 2019-05-21 DIAGNOSIS — Z7189 Other specified counseling: Secondary | ICD-10-CM | POA: Insufficient documentation

## 2019-05-21 DIAGNOSIS — C160 Malignant neoplasm of cardia: Secondary | ICD-10-CM | POA: Insufficient documentation

## 2019-05-21 NOTE — Progress Notes (Signed)
Hematology/Oncology Consult note [facility]  Telephone:([phone] [phone]  Patient Care Team: Kendrick Ranch, MD as PCP - General (Internal Medicine) Constance Haw, MD as PCP - Electrophysiology (Cardiology) Sherren Mocha, MD as PCP - Cardiology (Cardiology) Sindy Guadeloupe, MD as Consulting Physician (Hematology and Oncology) Clent Jacks, RN as Oncology Nurse Navigator   Name of the patient: Theresa Cain  [id number]  1927/03/10   Date of visit: 05/21/19  Diagnosis- gastric adenocarcinoma GE junction [id number]  Chief complaint/ Reason for visit- discuss goals of care  Heme/Onc history: Patient is a 86 year old female with prior h/o neuroendocrine tumor in 2014. She presented with acute upper GI bleed s/p EGD which showed mass in gastric cardia just below the GE junction and acute upper GI bleed s/p hemospray. Was discharged and eliquis was held and came with wrist drop- radial nerve palsy versus acute ischemic stroke per neurology. Pathology from gastric mass shows gastric adenocarcinoma. Wrist drop has resolved but hand grip remains weak  PET scan showed hypermetabolism in the gastric cardia and small LN in the gastrohepatic ligament   Interval history- denies any further bleeding, dark melanotic stools. eliuis currently on hold  ECOG PS- 1 Pain scale- 0   Review of systems- Review of Systems  Constitutional: Positive for malaise/fatigue. Negative for chills, fever and weight loss.  HENT: Negative for congestion, ear discharge and nosebleeds.   Eyes: Negative for blurred vision.  Respiratory: Negative for cough, hemoptysis, sputum production, shortness of breath and wheezing.   Cardiovascular: Negative for chest pain, palpitations, orthopnea and claudication.  Gastrointestinal: Negative for abdominal pain, blood in stool, constipation, diarrhea, heartburn, melena, nausea and vomiting.  Genitourinary: Negative for  dysuria, flank pain, frequency, hematuria and urgency.  Musculoskeletal: Negative for back pain, joint pain and myalgias.  Skin: Negative for rash.  Neurological: Negative for dizziness, tingling, focal weakness, seizures, weakness and headaches.  Endo/Heme/Allergies: Does not bruise/bleed easily.  Psychiatric/Behavioral: Negative for depression and suicidal ideas. The patient does not have insomnia.       Allergies  Allergen Reactions  . Penicillins Hives and Other (See Comments)    PATIENT HAS HAD A PCN REACTION WITH IMMEDIATE RASH, FACIAL/TONGUE/THROAT SWELLING, SOB, OR LIGHTHEADEDNESS WITH HYPOTENSION:  #  #  #  YES  #  #  #   Has patient had a PCN reaction causing severe rash involving mucus membranes or skin necrosis: No Has patient had a PCN reaction that required hospitalization: No Has patient had a PCN reaction occurring within the last 10 years: No If all of the above answers are "NO", then may proceed with Cephalosporin use.   . Sulfa Antibiotics Hives  . Cymbalta [Duloxetine Hcl]     Sweat, feels like having a heart attack      Past Medical History:  Diagnosis Date  . Acute on chronic diastolic CHF (congestive heart failure) ([employer]) 02/22/2016  . Anemia   . Bradycardia   . Carotid artery occlusion   . Chronic diastolic CHF (congestive heart failure) ([place])   . Chronic kidney disease    elevated creatinne  . Coronary artery disease involving native coronary artery of native heart   . GERD (gastroesophageal reflux disease)   . HLD (hyperlipidemia)   . Hypertension   . Hypothyroidism   . Obesity   . Osteoarthritis   . Permanent atrial fibrillation ([place])   . Presence of permanent cardiac pacemaker   . PVD (peripheral vascular disease) ([place])   .  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 Severe aortic stenosis   . Stomach cancer ([place]) ~ 2013  .  Vertigo      Past Surgical History:  Procedure Laterality Date  . CARDIOVASCULAR STRESS TEST    . CARDIOVERSION N/A 08/08/2016   Procedure: CARDIOVERSION;  Surgeon: Sueanne Margarita, MD;  Location: [facility] ENDOSCOPY;  Service: Cardiovascular;  Laterality: N/A;  . CAROTID ENDARTERECTOMY Right ~ 2005  . CATARACT EXTRACTION W/ INTRAOCULAR LENS IMPLANT Right   . CHOLECYSTECTOMY  ~ 2013   "part of her Whipple OR"  . EP IMPLANTABLE DEVICE N/A 03/04/2016   Procedure: Pacemaker Implant;  Surgeon: Will Meredith Leeds, MD;  Location: [place] CV LAB;  Service: Cardiovascular;  Laterality: N/A;  . EP IMPLANTABLE DEVICE N/A 03/05/2016   Procedure: Lead Revision/Repair;  Surgeon: Deboraha Sprang, MD;  Location: [place] CV LAB;  Service: Cardiovascular;  Laterality: N/A;  . ESOPHAGOGASTRODUODENOSCOPY (EGD) WITH PROPOFOL N/A 05/13/2019   Procedure: ESOPHAGOGASTRODUODENOSCOPY (EGD) WITH PROPOFOL;  Surgeon: Jonathon Bellows, MD;  Location[facility] ENDOSCOPY;  Service: Gastroenterology;  Laterality: N/A;  . INSERT / REPLACE / REMOVE PACEMAKER    . LEFT HEART CATH AND CORONARY ANGIOGRAPHY N/A 03/27/2017   Procedure: LEFT HEART CATH AND CORONARY ANGIOGRAPHY;  Surgeon: Minna Merritts, MD;  Location: [place] CV LAB;  Service: Cardiovascular;  Laterality: N/A;  . TEE WITHOUT CARDIOVERSION N/A 05/12/2017   Procedure: TRANSESOPHAGEAL ECHOCARDIOGRAM (TEE);  Surgeon: Sherren Mocha, MD;  Location: [place];  Service: Open Heart Surgery;  Laterality: N/A;  . TRANSCATHETER AORTIC VALVE REPLACEMENT, TRANSFEMORAL N/A 05/12/2017   Procedure: TRANSCATHETER AORTIC VALVE REPLACEMENT, TRANSFEMORAL using a 55mm Edwards Sapien 3 Aortic Valve;  Surgeon: Sherren Mocha, MD;  Location: [place];  Service: Open Heart Surgery;  Laterality: N/A;  . US ECHOCARDIOGRAPHY    . VAGINAL HYSTERECTOMY    . WHIPPLE PROCEDURE  ~ 2013    Social History   Socioeconomic History  . Marital status: Widowed    Spouse name: Not on file  . Number of  children: Not on file  . Years of education: Not on file  . Highest education level: Not on file  Occupational History  . Not on file  Tobacco Use  . Smoking status: Never Smoker  . Smokeless tobacco: Never Used  Substance and Sexual Activity  . Alcohol use: No  . Drug use: No  . Sexual activity: Never  Other Topics Concern  . Not on file  Social History Narrative  . Not on file   Social Determinants of Health   Financial Resource Strain:   . Difficulty of Paying Living Expenses: Not on file  Food Insecurity:   . Worried About Charity fundraiser in the Last Year: Not on file  . Ran Out of Food in the Last Year: Not on file  Transportation Needs:   . Lack of Transportation (Medical): Not on file  . Lack of Transportation (Non-Medical): Not on file  Physical Activity:   . Days of Exercise per Week: Not on file  . Minutes of Exercise per Session: Not on file  Stress:   . Feeling of Stress : Not on file  Social Connections:   . Frequency of Communication with Friends and Family: Not on file  . Frequency of Social Gatherings with Friends and Family: Not on file  . Attends Religious Services: Not on file  .  Active Member of Clubs or Organizations: Not on file  . Attends Archivist Meetings: Not on file  . Marital Status: Not on file  Intimate Partner Violence:   . Fear of Current or Ex-Partner: Not on file  . Emotionally Abused: Not on file  . Physically Abused: Not on file  . Sexually Abused: Not on file    Family History  Problem Relation Age of Onset  . Heart disease Sister   . Breast cancer Sister   . Diabetes Mellitus II Sister   . Cerebral aneurysm Mother   . Healthy Father   . Lung cancer Brother   . Throat cancer Brother      Current Outpatient Medications:  .  acetaminophen (TYLENOL) 500 MG tablet, Take 1,000 mg by mouth every 6 (six) hours as needed for moderate pain., Disp: , Rfl:  .  aspirin EC 81 MG tablet, Take 1 tablet (81 mg total) by  mouth daily., Disp:  , Rfl:  .  diltiazem (CARDIZEM CD) 180 MG 24 hr capsule, Take 1 capsule (180 mg total) by mouth daily., Disp: 90 capsule, Rfl: 3 .  escitalopram (LEXAPRO) 10 MG tablet, Take 10 mg by mouth at bedtime. , Disp: , Rfl:  .  ferrous sulfate 325 (65 FE) MG tablet, Take 325 mg by mouth daily with breakfast., Disp: , Rfl:  .  levothyroxine (SYNTHROID) 50 MCG tablet, Take 1 tablet (50 mcg total) by mouth daily., Disp: 30 tablet, Rfl: 0 .  omeprazole (PRILOSEC) 40 MG capsule, Take 1 capsule (40 mg total) by mouth 2 (two) times daily before a meal., Disp: 60 capsule, Rfl: 0 .  pravastatin (PRAVACHOL) 40 MG tablet, Take 40 mg by mouth every evening. , Disp: , Rfl:  .  Vitamin D, Ergocalciferol, (DRISDOL) 50000 units CAPS capsule, Take 50,000 Units by mouth 2 (two) times a week. Take on Sunday and Thursday, Disp: , Rfl:  .  ELIQUIS 2.5 MG TABS tablet, Hold until further notice per your outpatient doctor, due to your GI bleeding. (Patient not taking: Reported on 05/19/2019), Disp: 60 tablet, Rfl:   Physical exam:  Vitals:   05/19/19 0938  BP: (!) 149/58  Pulse: 63  Temp: 98.7 F (37.1 C)  TempSrc: Tympanic  SpO2: 99%  Weight: 198 lb (89.8 kg)   Physical Exam Constitutional:      Comments: She is sitting in a wheelchair. Appears in no acute distress  HENT:     Head: Normocephalic and atraumatic.  Eyes:     Pupils: Pupils are equal, round, and reactive to light.  Cardiovascular:     Rate and Rhythm: Normal rate and regular rhythm.     Heart sounds: Normal heart sounds.  Pulmonary:     Effort: Pulmonary effort is normal.     Breath sounds: Normal breath sounds.  Abdominal:     General: Bowel sounds are normal.     Palpations: Abdomen is soft.  Musculoskeletal:     Cervical back: Normal range of motion.  Skin:    General: Skin is warm and dry.  Neurological:     Mental Status: She is alert and oriented to person, place, and time.      CMP Latest Ref Rng & Units  05/17/2019  Glucose 70 - 99 mg/dL 117(H)  BUN 8 - 23 mg/dL 11  Creatinine 0.44 - 1.00 mg/dL 1.36(H)  Sodium 135 - 145 mmol/L 139  Potassium 3.5 - 5.1 mmol/L 3.9  Chloride 98 - 111 mmol/L 106  CO2 22 - 32 mmol/L 25  Calcium 8.9 - 10.3 mg/dL 8.5(L)  Total Protein 6.5 - 8.1 g/dL -  Total Bilirubin 0.3 - 1.2 mg/dL -  Alkaline Phos 38 - 126 U/L -  AST 15 - 41 U/L -  ALT 0 - 44 U/L -   CBC Latest Ref Rng & Units 05/17/2019  WBC 4.0 - 10.5 K/uL 5.1  Hemoglobin 12.0 - 15.0 g/dL 7.6(L)  Hematocrit 36.0 - 46.0 % 25.1(L)  Platelets 150 - 400 K/uL 171    No images are attached to the encounter.  CT HEAD WO CONTRAST  Result Date: 05/17/2019 CLINICAL DATA:  Right handed weakness EXAM: CT HEAD WITHOUT CONTRAST TECHNIQUE: Contiguous axial images were obtained from the base of the skull through the vertex without intravenous contrast. COMPARISON:  05/15/2019 FINDINGS: Brain: No evidence of acute infarction, hemorrhage, hydrocephalus, extra-axial collection or mass lesion/mass effect. Extensive low-density changes within the periventricular and subcortical white matter compatible with chronic microvascular ischemic change. Mild diffuse cerebral volume loss. Vascular: Mild atherosclerotic calcifications involving the large vessels of the skull base. No unexpected hyperdense vessel. Skull: Normal. Negative for fracture or focal lesion. Sinuses/Orbits: Mild bilateral paranasal sinus mucosal thickening. Mastoid air cells are clear. Orbital structures unremarkable. Other: None. IMPRESSION: 1. No CT evidence of acute intracranial pathology. No interval change from prior 05/15/2019. 2. Chronic microvascular ischemic changes and cerebral volume loss. 3. Mild paranasal sinus disease. Electronically Signed   By: Davina Poke D.O.   On: 05/17/2019 11:20   CT Head Wo Contrast  Result Date: 05/15/2019 CLINICAL DATA:  Weakness EXAM: CT HEAD WITHOUT CONTRAST TECHNIQUE: Contiguous axial images were obtained from the  base of the skull through the vertex without intravenous contrast. COMPARISON:  None. FINDINGS: Brain: There is no mass, hemorrhage or extra-axial collection. The size and configuration of the ventricles and extra-axial CSF spaces are normal. There is hypoattenuation of the white matter, most commonly indicating chronic small vessel disease. Vascular: Atherosclerotic calcification of the internal carotid arteries at the skull base. No abnormal hyperdensity of the major intracranial arteries or dural venous sinuses. Skull: The visualized skull base, calvarium and extracranial soft tissues are normal. Sinuses/Orbits: No fluid levels or advanced mucosal thickening of the visualized paranasal sinuses. No mastoid or middle ear effusion. The orbits are normal. IMPRESSION: Chronic small vessel disease without acute intracranial abnormality. Electronically Signed   By: Ulyses Jarred M.D.   On: 05/15/2019 20:39   US Carotid Bilateral (at [facility] and [facility] only)  Result Date: 05/16/2019 CLINICAL DATA:  86 year old female with a history of stroke EXAM: BILATERAL CAROTID DUPLEX ULTRASOUND TECHNIQUE: Pearline Cables scale imaging, color Doppler and duplex ultrasound were performed of bilateral carotid and vertebral arteries in the neck. COMPARISON:  None. FINDINGS: Criteria: Quantification of carotid stenosis is based on velocity parameters that correlate the residual internal carotid diameter with NASCET-based stenosis levels, using the diameter of the distal internal carotid lumen as the denominator for stenosis measurement. The following velocity measurements were obtained: RIGHT ICA:  Systolic Q000111Q cm/sec, Diastolic 21 cm/sec CCA:  A999333 cm/sec SYSTOLIC ICA/CCA RATIO:  0.8 ECA:  184 cm/sec LEFT ICA: N/a CCA:  99991111 cm/sec SYSTOLIC ICA/CCA RATIO:  N/a ECA:  298 cm/sec Right Brachial SBP: Not acquired Left Brachial SBP: Not acquired RIGHT CAROTID ARTERY: Calcification of the right common carotid artery. Intermediate waveform maintained.  Moderate heterogeneous and partially calcified plaque at the right carotid bifurcation. Mild shadowing. Low resistance waveform of the right ICA. No significant tortuosity. RIGHT  VERTEBRAL ARTERY: Antegrade flow with low resistance waveform. LEFT CAROTID ARTERY: Calcification of the left common carotid artery. High resistance waveform maintained of the CCA. Occlusion of the ICA at the bifurcation. LEFT VERTEBRAL ARTERY:  Antegrade flow with low resistance waveform. IMPRESSION: Right: Color duplex indicates moderate heterogeneous and calcified plaque, with no hemodynamically significant stenosis by duplex criteria in the extracranial cerebrovascular circulation. 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 Signed, Dulcy Fanny. Dellia Nims, RPVI [facility] Radiology Electronically Signed   By: Corrie Mckusick D.O.   On: 05/16/2019 12:35   NM PET Image Initial (PI) Skull Base To Thigh  Result Date: 05/18/2019 CLINICAL DATA:  Initial treatment strategy for newly diagnosed gastric adenocarcinoma of the gastric cardia. Additional history of Whipple procedure in 2013 for gastric cancer. History of neuroendocrine tumor of the small bowel. EXAM: NUCLEAR MEDICINE PET SKULL BASE TO THIGH TECHNIQUE: 11.0 mCi F-18 FDG was injected intravenously. Full-ring PET imaging was performed from the skull base to thigh after the radiotracer. CT data was obtained and used for attenuation correction and anatomic localization. Fasting blood glucose: 83 mg/dl COMPARISON:  04/07/2017 CT angiogram of the chest, abdomen and pelvis. FINDINGS: Mediastinal blood pool activity: SUV max 2.7 Liver activity: SUV max NA NECK: No enlarged or hypermetabolic lymph nodes in the neck. Incidental CT findings: none CHEST: No enlarged or hypermetabolic axillary, mediastinal or hilar lymph nodes. No hypermetabolic pulmonary findings. Incidental CT findings: Mild cardiomegaly. Two lead left  subclavian pacemaker with lead tips in the right atrium and right ventricular apex. Aortic valve prosthesis in place. Three-vessel coronary atherosclerosis. Atherosclerotic thoracic aorta with ectatic 4.3 cm ascending thoracic aorta. Trace dependent bilateral pleural effusions. Mild interlobular septal thickening and patchy ground-glass opacities in both lungs. Solid 0.9 cm posterior left lower lobe pulmonary nodule (series 3/image 119), stable since 04/07/2017 chest CT and without significant FDG uptake, considered benign. ABDOMEN/PELVIS: Intensely hypermetabolic mass in the posterior gastric cardia with max SUV 27.2, poorly delineated on the CT images, approximately 3.5 x 2.5 cm (series 3/image 150). There is mild hypermetabolism (max SUV 4.1) associated with a small 0.8 cm gastrohepatic ligament lymph node near the gastric cardia (series 3/image 158). No abnormal hypermetabolic activity within the liver, pancreas, adrenal glands, or spleen. No additional hypermetabolic lymph nodes in the abdomen or pelvis. Incidental CT findings: Cholecystectomy. Partial distal gastrectomy with gastrojejunostomy. Partial pancreatectomy with apparent pancreaticojejunostomy and choledochojejunostomy. Mild sigmoid diverticulosis. Hysterectomy. Atherosclerotic nonaneurysmal abdominal aorta. SKELETON: No focal hypermetabolic activity to suggest skeletal metastasis. Incidental CT findings: none IMPRESSION: 1. Intensely hypermetabolic posterior gastric cardia mass, compatible with known primary gastric carcinoma. 2. Mild hypermetabolism associated with a small gastrohepatic ligament lymph node near the gastric cardia, compatible with local nodal metastasis. 3. Otherwise no hypermetabolic metastatic disease. 4. Mild cardiomegaly. Trace dependent bilateral pleural effusions. Mild interlobular septal thickening and patchy ground-glass opacity in the lungs, cannot exclude mild pulmonary edema due to congestive heart failure. 5. Chronic  findings include: Aortic Atherosclerosis (ICD10-I70.0). Ectatic 4.3 cm ascending thoracic aorta. Mild sigmoid diverticulosis. Postsurgical changes from Whipple procedure. Electronically Signed   By: Ilona Sorrel M.D.   On: 05/18/2019 15:21   ECHOCARDIOGRAM COMPLETE  Result Date: 05/17/2019    ECHOCARDIOGRAM REPORT   Patient Name:   SHIVYA SCHRAM Date of Exam: 05/17/2019 Medical Rec #:  [id number]   Height:       72.0 in Accession #:    [id number]  Weight:       195.8  lb Date of Birth:  Apr 09, 1926   BSA:          2.111 m Patient Age:    62 years    BP:           104/56 mmHg Patient Gender: F           HR:           62 bpm. Exam Location:  [facility] Procedure: 2D Echo, Color Doppler and Cardiac Doppler Indications:     I163.9 Stroke  History:         Patient has prior history of Echocardiogram examinations. CAD,                  Pacemaker, PVD, CKD, Aortic Valve Disease; Risk                  Factors:Hypertension and Dyslipidemia.  Sonographer:     Charmayne Sheer RDCS (AE) Referring Phys:  [id number] Athena Masse Diagnosing Phys: Nelva Bush MD  Sonographer Comments: Suboptimal parasternal window. IMPRESSIONS  1. Left ventricular ejection fraction, by estimation, is 65 to 70%. The left ventricle has normal function. The left ventricle has no regional wall motion abnormalities. There is mild left ventricular hypertrophy. Left ventricular diastolic parameters are indeterminate.  2. Right ventricular systolic function is normal. The right ventricular size is normal. There is moderately elevated pulmonary artery systolic pressure.  3. Left atrial size was moderately dilated.  4. Right atrial size was mildly dilated.  5. The mitral valve is grossly normal. Mild to moderate mitral valve regurgitation. No evidence of mitral stenosis.  6. Tricuspid valve regurgitation is mild to moderate.  7. The aortic valve has been repaired/replaced. Aortic valve regurgitation is not visualized. Aortic valve mean gradient measures 22.0 mmHg.  8.  The inferior vena cava is normal in size with <50% respiratory variability, suggesting right atrial pressure of 8 mmHg. FINDINGS  Left Ventricle: Left ventricular ejection fraction, by estimation, is 65 to 70%. The left ventricle has normal function. The left ventricle has no regional wall motion abnormalities. The left ventricular internal cavity size was normal in size. There is  mild left ventricular hypertrophy. Left ventricular diastolic parameters are indeterminate. Right Ventricle: The right ventricular size is normal. No increase in right ventricular wall thickness. Right ventricular systolic function is normal. There is moderately elevated pulmonary artery systolic pressure. The tricuspid regurgitant velocity is 2.90 m/s, and with an assumed right atrial pressure of 8 mmHg, the estimated right ventricular systolic pressure is 99991111 mmHg. Left Atrium: Left atrial size was moderately dilated. Right Atrium: Right atrial size was mildly dilated. Pericardium: There is no evidence of pericardial effusion. Mitral Valve: The mitral valve is grossly normal. Mild mitral annular calcification. Mild to moderate mitral valve regurgitation. No evidence of mitral valve stenosis. MV peak gradient, 10.1 mmHg. The mean mitral valve gradient is 3.0 mmHg. Tricuspid Valve: The tricuspid valve is not well visualized. Tricuspid valve regurgitation is mild to moderate. Aortic Valve: There is no significant paravalvular leak. The aortic valve has been repaired/replaced. Aortic valve regurgitation is not visualized. Aortic valve mean gradient measures 22.0 mmHg. Aortic valve peak gradient measures 38.4 mmHg. Aortic valve  area, by VTI measures 0.99 cm. There is a 26 mm Edwards Sapien prosthetic, stented (TAVR) valve present in the aortic position. Pulmonic Valve: The pulmonic valve was not well visualized. Pulmonic valve regurgitation is not visualized. No evidence of pulmonic stenosis. Aorta: The aortic root is normal in size and  structure. Pulmonary Artery: The pulmonary artery is not well seen. Venous: The inferior vena cava is normal in size with less than 50% respiratory variability, suggesting right atrial pressure of 8 mmHg. IAS/Shunts: No atrial level shunt detected by color flow Doppler. Additional Comments: A pacer wire is visualized in the right ventricle.  LEFT VENTRICLE PLAX 2D LVIDd:         4.98 cm  Diastology LVIDs:         3.35 cm  LV e' lateral:   5.11 cm/s LV PW:         1.08 cm  LV E/e' lateral: 26.7 LV IVS:        1.05 cm  LV e' medial:    7.29 cm/s LVOT diam:     1.80 cm  LV E/e' medial:  18.7 LV SV:         64 LV SV Index:   30 LVOT Area:     2.54 cm  RIGHT VENTRICLE RV Basal diam:  3.63 cm LEFT ATRIUM              Index       RIGHT ATRIUM           Index LA diam:        5.80 cm  2.75 cm/m  RA Area:     18.90 cm LA Vol (A2C):   127.0 ml 60.15 ml/m RA Volume:   50.00 ml  23.68 ml/m LA Vol (A4C):   81.1 ml  38.41 ml/m LA Biplane Vol: 101.0 ml 47.84 ml/m  AORTIC VALVE                    PULMONIC VALVE AV Area (Vmax):    0.93 cm     PV Vmax:       1.13 m/s AV Area (Vmean):   0.91 cm     PV Vmean:      72.000 cm/s AV Area (VTI):     0.99 cm     PV VTI:        0.209 m AV Vmax:           310.00 cm/s  PV Peak grad:  5.1 mmHg AV Vmean:          222.000 cm/s PV Mean grad:  2.0 mmHg AV VTI:            0.645 m AV Peak Grad:      38.4 mmHg AV Mean Grad:      22.0 mmHg LVOT Vmax:         113.00 cm/s LVOT Vmean:        79.800 cm/s LVOT VTI:          0.252 m LVOT/AV VTI ratio: 0.39  AORTA Ao Root diam: 2.60 cm MITRAL VALVE                TRICUSPID VALVE MV Area (PHT): 2.66 cm     TR Peak grad:   33.6 mmHg MV Peak grad:  10.1 mmHg    TR Vmax:        290.00 cm/s MV Mean grad:  3.0 mmHg MV Vmax:       1.59 m/s     SHUNTS MV Vmean:      74.7 cm/s    Systemic VTI:  0.25 m MV Decel Time: 286 msec     Systemic Diam: 1.80 cm MV E velocity: 136.50 cm/s Nelva Bush MD Electronically signed by Nelva Bush MD  Signature  Date/Time: 05/17/2019/4:45:01 PM    Final      Assessment and plan- Patient is a 86 y.o. female with adenocarcinoma of the gastric cardia/ GE junction  I have reviewed PET/CT scan images independently and discussed findings with the patient.  PET CT scan shows hypermetabolic in the gastric cardia along with another lymph node in the gastrohepatic ligament.  No evidence of distant metastatic disease.  Apparent peritoneal metastases is still a possibility which cannot be confirmed without a diagnostic laparoscopy.  I had previously discussed about proceeding with concurrent chemoradiation to which the patient had agreed.  However after thinking about it over the weekend patient does not wish to proceed with chemotherapy.  She does not wish to get a surgical consult.  She is willing to consider palliative radiation alone.  I have personally spoken to Dr. Donella Stade who is going to be seeing her later today to make plans about palliative radiation treatment.  We discussed that radiation is unlikely to be curative and can offer palliation especially given her acute bleeding symptoms.  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  I have therefore asked her to restart her Eliquis on Sunday night.  If she were to have any upper GI bleed after starting Eliquis, I will refer her again to GI for another hemospray until radiation can take effect.  Iron deficiency anemia: Secondary to acute upper GI bleed.  She has received 2 doses of Feraheme in the hospital and I will continue to monitor her counts while she is receiving radiation treatment.  Upon completion of radiation treatment patient has a choice to consider hospice versus best supportive care without hospice   Total face to face encounter time for this patient visit was 40 min.    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 Goals of care, counseling/discussion   3. Iron deficiency anemia, unspecified iron deficiency anemia type      Dr. Randa Evens, MD, MPH [facility] [id number] 05/21/2019 12:50 PM

## 2019-05-23 ENCOUNTER — Inpatient Hospital Stay: Payer: Medicare Other | Admitting: Oncology

## 2019-05-23 ENCOUNTER — Other Ambulatory Visit: Payer: Medicare Other

## 2019-05-23 ENCOUNTER — Ambulatory Visit: Payer: Medicare Other

## 2019-05-24 ENCOUNTER — Other Ambulatory Visit: Payer: Self-pay

## 2019-05-24 ENCOUNTER — Ambulatory Visit
Admission: RE | Admit: 2019-05-24 | Discharge: 2019-05-24 | Disposition: A | Discharge status: Home / Self Care | Payer: Medicare Other | Source: Ambulatory Visit | Attending: Radiation Oncology | Admitting: Radiation Oncology

## 2019-05-24 DIAGNOSIS — C160 Malignant neoplasm of cardia: Secondary | ICD-10-CM | POA: Insufficient documentation

## 2019-05-25 ENCOUNTER — Other Ambulatory Visit: Payer: Self-pay

## 2019-05-25 ENCOUNTER — Ambulatory Visit
Admission: RE | Admit: 2019-05-25 | Discharge: 2019-05-25 | Disposition: A | Discharge status: Home / Self Care | Payer: Medicare Other | Source: Ambulatory Visit | Attending: Radiation Oncology | Admitting: Radiation Oncology

## 2019-05-25 DIAGNOSIS — C160 Malignant neoplasm of cardia: Secondary | ICD-10-CM | POA: Diagnosis present

## 2019-05-26 ENCOUNTER — Inpatient Hospital Stay: Payer: Medicare Other | Attending: Oncology

## 2019-05-26 ENCOUNTER — Other Ambulatory Visit: Payer: Medicare Other

## 2019-05-26 ENCOUNTER — Ambulatory Visit
Admission: RE | Admit: 2019-05-26 | Discharge: 2019-05-26 | Disposition: A | Discharge status: Home / Self Care | Payer: Medicare Other | Source: Ambulatory Visit | Attending: Radiation Oncology | Admitting: Radiation Oncology

## 2019-05-26 ENCOUNTER — Ambulatory Visit: Payer: Medicare Other

## 2019-05-26 ENCOUNTER — Other Ambulatory Visit: Payer: Self-pay

## 2019-05-26 ENCOUNTER — Ambulatory Visit: Payer: Medicare Other | Admitting: Oncology

## 2019-05-26 DIAGNOSIS — D509 Iron deficiency anemia, unspecified: Secondary | ICD-10-CM | POA: Insufficient documentation

## 2019-05-26 DIAGNOSIS — C160 Malignant neoplasm of cardia: Secondary | ICD-10-CM | POA: Insufficient documentation

## 2019-05-26 DIAGNOSIS — C169 Malignant neoplasm of stomach, unspecified: Secondary | ICD-10-CM

## 2019-05-26 LAB — COMPREHENSIVE METABOLIC PANEL
ALT: 11 U/L (ref 0–44)
AST: 15 U/L (ref 15–41)
Albumin: 3.5 g/dL (ref 3.5–5.0)
Alkaline Phosphatase: 89 U/L (ref 38–126)
Anion gap: 10 (ref 5–15)
BUN: 14 mg/dL (ref 8–23)
CO2: 24 mmol/L (ref 22–32)
Calcium: 8.6 mg/dL — ABNORMAL LOW (ref 8.9–10.3)
Chloride: 104 mmol/L (ref 98–111)
Creatinine, Ser: 1.49 mg/dL — ABNORMAL HIGH (ref 0.44–1.00)
GFR calc Af Amer: 35 mL/min — ABNORMAL LOW (ref >60)
GFR calc non Af Amer: 31 mL/min — ABNORMAL LOW (ref >60)
Glucose, Bld: 127 mg/dL — ABNORMAL HIGH (ref 70–99)
Potassium: 4 mmol/L (ref 3.5–5.1)
Sodium: 138 mmol/L (ref 135–145)
Total Bilirubin: 0.7 mg/dL (ref 0.3–1.2)
Total Protein: 6.4 g/dL — ABNORMAL LOW (ref 6.5–8.1)

## 2019-05-26 LAB — CBC
HCT: 31.6 % — ABNORMAL LOW (ref 36.0–46.0)
Hemoglobin: 9.5 g/dL — ABNORMAL LOW (ref 12.0–15.0)
MCH: 27.8 pg (ref 26.0–34.0)
MCHC: 30.1 g/dL (ref 30.0–36.0)
MCV: 92.4 fL (ref 80.0–100.0)
Platelets: 163 10*3/uL (ref 150–400)
RBC: 3.42 MIL/uL — ABNORMAL LOW (ref 3.87–5.11)
RDW: 16.4 % — ABNORMAL HIGH (ref 11.5–15.5)
WBC: 6.3 10*3/uL (ref 4.0–10.5)
nRBC: 0 % (ref 0.0–0.2)

## 2019-05-27 ENCOUNTER — Other Ambulatory Visit: Payer: Self-pay

## 2019-05-27 ENCOUNTER — Ambulatory Visit
Admission: RE | Admit: 2019-05-27 | Discharge: 2019-05-27 | Disposition: A | Discharge status: Home / Self Care | Payer: Medicare Other | Source: Ambulatory Visit | Attending: Radiation Oncology | Admitting: Radiation Oncology

## 2019-05-27 DIAGNOSIS — C160 Malignant neoplasm of cardia: Secondary | ICD-10-CM | POA: Diagnosis not present

## 2019-05-27 LAB — CEA: CEA: 3.2 ng/mL (ref 0.0–4.7)

## 2019-05-30 ENCOUNTER — Inpatient Hospital Stay: Payer: Medicare Other | Attending: Oncology

## 2019-05-30 ENCOUNTER — Ambulatory Visit: Payer: Medicare Other | Admitting: Oncology

## 2019-05-30 ENCOUNTER — Inpatient Hospital Stay (HOSPITAL_BASED_OUTPATIENT_CLINIC_OR_DEPARTMENT_OTHER): Payer: Medicare Other | Admitting: Oncology

## 2019-05-30 ENCOUNTER — Ambulatory Visit
Admission: RE | Admit: 2019-05-30 | Discharge: 2019-05-30 | Disposition: A | Discharge status: Home / Self Care | Payer: Medicare Other | Source: Ambulatory Visit | Attending: Radiation Oncology | Admitting: Radiation Oncology

## 2019-05-30 ENCOUNTER — Other Ambulatory Visit: Payer: Medicare Other

## 2019-05-30 ENCOUNTER — Encounter: Payer: Self-pay | Admitting: Oncology

## 2019-05-30 ENCOUNTER — Other Ambulatory Visit: Payer: Self-pay

## 2019-05-30 VITALS — BP 155/71 | HR 63 | Temp 97.8°F | Wt 198.5 lb

## 2019-05-30 DIAGNOSIS — I639 Cerebral infarction, unspecified: Secondary | ICD-10-CM

## 2019-05-30 DIAGNOSIS — C160 Malignant neoplasm of cardia: Secondary | ICD-10-CM

## 2019-05-30 DIAGNOSIS — D509 Iron deficiency anemia, unspecified: Secondary | ICD-10-CM

## 2019-05-30 LAB — CBC WITH DIFFERENTIAL/PLATELET
Abs Immature Granulocytes: 0.02 10*3/uL (ref 0.00–0.07)
Basophils Absolute: 0 10*3/uL (ref 0.0–0.1)
Basophils Relative: 1 %
Eosinophils Absolute: 0.1 10*3/uL (ref 0.0–0.5)
Eosinophils Relative: 2 %
HCT: 35.2 % — ABNORMAL LOW (ref 36.0–46.0)
Hemoglobin: 10.5 g/dL — ABNORMAL LOW (ref 12.0–15.0)
Immature Granulocytes: 0 %
Lymphocytes Relative: 14 %
Lymphs Abs: 0.8 10*3/uL (ref 0.7–4.0)
MCH: 27.5 pg (ref 26.0–34.0)
MCHC: 29.8 g/dL — ABNORMAL LOW (ref 30.0–36.0)
MCV: 92.1 fL (ref 80.0–100.0)
Monocytes Absolute: 0.3 10*3/uL (ref 0.1–1.0)
Monocytes Relative: 5 %
Neutro Abs: 4.7 10*3/uL (ref 1.7–7.7)
Neutrophils Relative %: 78 %
Platelets: 173 10*3/uL (ref 150–400)
RBC: 3.82 MIL/uL — ABNORMAL LOW (ref 3.87–5.11)
RDW: 16 % — ABNORMAL HIGH (ref 11.5–15.5)
WBC: 6 10*3/uL (ref 4.0–10.5)
nRBC: 0 % (ref 0.0–0.2)

## 2019-05-30 NOTE — Progress Notes (Signed)
Patient has no current complaints.

## 2019-05-31 ENCOUNTER — Other Ambulatory Visit: Payer: Self-pay

## 2019-05-31 ENCOUNTER — Ambulatory Visit
Admission: RE | Admit: 2019-05-31 | Discharge: 2019-05-31 | Disposition: A | Discharge status: Home / Self Care | Payer: Medicare Other | Source: Ambulatory Visit | Attending: Radiation Oncology | Admitting: Radiation Oncology

## 2019-05-31 DIAGNOSIS — C160 Malignant neoplasm of cardia: Secondary | ICD-10-CM | POA: Diagnosis not present

## 2019-06-01 ENCOUNTER — Other Ambulatory Visit: Payer: Self-pay

## 2019-06-01 ENCOUNTER — Ambulatory Visit
Admission: RE | Admit: 2019-06-01 | Discharge: 2019-06-01 | Disposition: A | Discharge status: Home / Self Care | Payer: Medicare Other | Source: Ambulatory Visit | Attending: Radiation Oncology | Admitting: Radiation Oncology

## 2019-06-01 ENCOUNTER — Ambulatory Visit (INDEPENDENT_AMBULATORY_CARE_PROVIDER_SITE_OTHER): Payer: Medicare Other | Admitting: *Deleted

## 2019-06-01 DIAGNOSIS — Z950 Presence of cardiac pacemaker: Secondary | ICD-10-CM

## 2019-06-01 DIAGNOSIS — C160 Malignant neoplasm of cardia: Secondary | ICD-10-CM | POA: Diagnosis not present

## 2019-06-01 LAB — CUP PACEART REMOTE DEVICE CHECK
Battery Remaining Longevity: 54 mo
Battery Voltage: 2.99 V
Brady Statistic AP VP Percent: 0.04 %
Brady Statistic AP VS Percent: 0 %
Brady Statistic AS VP Percent: 85.75 %
Brady Statistic AS VS Percent: 14.21 %
Brady Statistic RA Percent Paced: 0.03 %
Brady Statistic RV Percent Paced: 85.76 %
Date Time Interrogation Session: 20210310100757
Implantable Lead Implant Date: 20171212
Implantable Lead Implant Date: 20171212
Implantable Lead Location: 753859
Implantable Lead Location: 753860
Implantable Lead Model: 5076
Implantable Lead Model: 5076
Implantable Pulse Generator Implant Date: 20171212
Lead Channel Impedance Value: 285 Ohm
Lead Channel Impedance Value: 342 Ohm
Lead Channel Impedance Value: 361 Ohm
Lead Channel Impedance Value: 399 Ohm
Lead Channel Pacing Threshold Amplitude: 0.5 V
Lead Channel Pacing Threshold Pulse Width: 0.4 ms
Lead Channel Sensing Intrinsic Amplitude: 0.375 mV
Lead Channel Sensing Intrinsic Amplitude: 0.375 mV
Lead Channel Sensing Intrinsic Amplitude: 9.625 mV
Lead Channel Sensing Intrinsic Amplitude: 9.625 mV
Lead Channel Setting Pacing Amplitude: 2 V
Lead Channel Setting Pacing Amplitude: 2.5 V
Lead Channel Setting Pacing Pulse Width: 0.4 ms
Lead Channel Setting Sensing Sensitivity: 2.8 mV

## 2019-06-01 NOTE — Progress Notes (Signed)
Hematology/Oncology Consult note [facility]  [phone] [phone]  Patient Care Team: Kendrick Ranch, MD as PCP - General (Internal Medicine) Constance Haw, MD as PCP - Electrophysiology (Cardiology) Sherren Mocha, MD as PCP - Cardiology (Cardiology) Sindy Guadeloupe, MD as Consulting Physician (Hematology and Oncology) Clent Jacks, RN as Oncology Nurse Navigator   Name of the patient: Theresa Cain  [id number]  [birth year]   Date of visit: 06/01/19  Diagnosis- gastric adenocarcinoma GE junction [id number]  Chief complaint/ Reason for visit-routine follow-up of iron deficiency anemia currently undergoing palliative radiation for gastric mass  Heme/Onc history: Patient is a 86 year old female with prior h/o neuroendocrine tumor in 2014. She presented with acute upper GI bleed s/p EGD which showed mass in gastric cardia just below the GE junction and acute upper GI bleed s/p hemospray. Was discharged and eliquis was held and came with wrist drop- radial nerve palsy versus acute ischemic stroke per neurology. Pathology from gastric mass shows gastric adenocarcinoma. Wrist drop has resolved but hand grip remains weak  PET scan showed hypermetabolism in the gastric cardia and small LN in the gastrohepatic ligament  Patient decided to opt for palliative RT alone. No chemo or surgical consideration  Interval history- she is tolerating  Radiation well without any significant side effects.  She does not notice any fresh blood in stool or dark tarry stools.  ECOG PS- 1 Pain scale- 0 Opioid associated constipation- no  Review of systems- Review of Systems  Constitutional: Positive for malaise/fatigue. Negative for chills, fever and weight loss.  HENT: Negative for congestion, ear discharge and nosebleeds.   Eyes: Negative for blurred vision.  Respiratory: Negative for cough, hemoptysis, sputum production, shortness of  breath and wheezing.   Cardiovascular: Negative for chest pain, palpitations, orthopnea and claudication.  Gastrointestinal: Negative for abdominal pain, blood in stool, constipation, diarrhea, heartburn, melena, nausea and vomiting.  Genitourinary: Negative for dysuria, flank pain, frequency, hematuria and urgency.  Musculoskeletal: Negative for back pain, joint pain and myalgias.  Skin: Negative for rash.  Neurological: Negative for dizziness, tingling, focal weakness, seizures, weakness and headaches.  Endo/Heme/Allergies: Does not bruise/bleed easily.  Psychiatric/Behavioral: Negative for depression and suicidal ideas. The patient does not have insomnia.      Allergies  Allergen Reactions   Penicillins Hives and Other (See Comments)    PATIENT HAS HAD A PCN REACTION WITH IMMEDIATE RASH, FACIAL/TONGUE/THROAT SWELLING, SOB, OR LIGHTHEADEDNESS WITH HYPOTENSION:  #  #  #  YES  #  #  #   Has patient had a PCN reaction causing severe rash involving mucus membranes or skin necrosis: No Has patient had a PCN reaction that required hospitalization: No Has patient had a PCN reaction occurring within the last 10 years: No If all of the above answers are "NO", then may proceed with Cephalosporin use.    Sulfa Antibiotics Hives   Cymbalta [Duloxetine Hcl]     Sweat, feels like having a heart attack      Past Medical History:  Diagnosis Date   Acute on chronic diastolic CHF (congestive heart failure) ([place]) 02/22/2016   Anemia    Bradycardia    Carotid artery occlusion    Chronic diastolic CHF (congestive heart failure) (H[facility])    Chronic kidney disease    elevated creatinne   Coronary artery disease involving native coronary artery of native heart    GERD (gastroesophageal reflux disease)    HLD (hyperlipidemia)  Hypertension    Hypothyroidism    Obesity    Osteoarthritis    Permanent atrial fibrillation ([place])    Presence of permanent cardiac pacemaker    PVD  (peripheral vascular disease) (HCC)    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   23 mm Edwards Sapien 3 transcatheter heart valve placed via percutaneous right transfemoral approach    Severe aortic stenosis    Stomach cancer ([place]) ~ 2013   Vertigo      Past Surgical History:  Procedure Laterality Date   CARDIOVASCULAR STRESS TEST     CARDIOVERSION N/A 08/08/2016   Procedure: CARDIOVERSION;  Surgeon: Sueanne Margarita, MD;  Location: [place];  Service: Cardiovascular;  Laterality: N/A;   CAROTID ENDARTERECTOMY Right ~ 2005   CATARACT EXTRACTION W/ INTRAOCULAR LENS IMPLANT Right    CHOLECYSTECTOMY  ~ 2013   "part of her Whipple OR"   EP IMPLANTABLE DEVICE N/A 03/04/2016   Procedure: Pacemaker Implant;  Surgeon: Will Meredith Leeds, MD;  Location: [place] [facility];  Service: Cardiovascular;  Laterality: N/A;   EP IMPLANTABLE DEVICE N/A 03/05/2016   Procedure: Lead Revision/Repair;  Surgeon: Deboraha Sprang, MD;  Location: [place] [facility];  Service: Cardiovascular;  Laterality: N/A;   ESOPHAGOGASTRODUODENOSCOPY (EGD) WITH PROPOFOL N/A 05/13/2019   Procedure: ESOPHAGOGASTRODUODENOSCOPY (EGD) WITH PROPOFOL;  Surgeon: Jonathon Bellows, MD;  [facility]: [facility] ENDOSCOPY;  Service: Gastroenterology;  Laterality: N/A;   INSERT / REPLACE / REMOVE PACEMAKER     LEFT HEART CATH AND CORONARY ANGIOGRAPHY N/A 03/27/2017   Procedure: LEFT HEART CATH AND CORONARY ANGIOGRAPHY;  Surgeon: Minna Merritts, MD;  Location: [place] [facility];  Service: Cardiovascular;  Laterality: N/A;   TEE WITHOUT CARDIOVERSION N/A 05/12/2017   Procedure: TRANSESOPHAGEAL ECHOCARDIOGRAM (TEE);  Surgeon: Sherren Mocha, MD;  Location: [place];  Service: Open Heart Surgery;  Laterality: N/A;   TRANSCATHETER AORTIC VALVE REPLACEMENT, TRANSFEMORAL N/A 05/12/2017   Procedure: TRANSCATHETER AORTIC VALVE REPLACEMENT, TRANSFEMORAL using a 4mm Edwards Sapien 3 Aortic Valve;  Surgeon: Sherren Mocha,  MD;  Location: [place];  Service: Open Heart Surgery;  Laterality: N/A;   US ECHOCARDIOGRAPHY     VAGINAL HYSTERECTOMY     WHIPPLE PROCEDURE  ~ 2013    Social History   Socioeconomic History   Marital status: Widowed    Spouse name: Not on file   Number of children: Not on file   Years of education: Not on file   Highest education level: Not on file  Occupational History   Not on file  Tobacco Use   Smoking status: Never Smoker   Smokeless tobacco: Never Used  Substance and Sexual Activity   Alcohol use: No   Drug use: No   Sexual activity: Never  Other Topics Concern   Not on file  Social History Narrative   Not on file   Social Determinants of Health   Financial Resource Strain:    Difficulty of Paying Living Expenses: Not on file  Food Insecurity:    Worried About [place] in the Last Year: Not on file   Ran Out of Food in the Last Year: Not on file  Transportation Needs:    Lack of Transportation (Medical): Not on file   Lack of Transportation (Non-Medical): Not on file  Physical Activity:    Days of Exercise per Week: Not on file   Minutes of Exercise per Session: Not on file  Stress:    Feeling of Stress : Not  on file  Social Connections:    Frequency of Communication with Friends and Family: Not on file   Frequency of Social Gatherings with Friends and Family: Not on file   Attends Religious Services: Not on Electrical engineer or Organizations: Not on file   Attends Archivist Meetings: Not on file   Marital Status: Not on file  Intimate Partner Violence:    Fear of Current or Ex-Partner: Not on file   Emotionally Abused: Not on file   Physically Abused: Not on file   Sexually Abused: Not on file    Family History  Problem Relation Age of Onset   Heart disease Sister    Breast cancer Sister    Diabetes Mellitus II Sister    Cerebral aneurysm Mother    Healthy Father    Lung  cancer Brother    Throat cancer Brother      Current Outpatient Medications:    diltiazem (CARDIZEM CD) 180 MG 24 hr capsule, Take 1 capsule (180 mg total) by mouth daily., Disp: 90 capsule, Rfl: 3   ELIQUIS 2.5 MG TABS tablet, Hold until further notice per your outpatient doctor, due to your GI bleeding., Disp: 60 tablet, Rfl:    escitalopram (LEXAPRO) 10 MG tablet, Take 10 mg by mouth at bedtime. , Disp: , Rfl:    ferrous sulfate 325 (65 FE) MG tablet, Take 325 mg by mouth daily with breakfast., Disp: , Rfl:    levothyroxine (SYNTHROID) 50 MCG tablet, Take 1 tablet (50 mcg total) by mouth daily., Disp: 30 tablet, Rfl: 0   omeprazole (PRILOSEC) 40 MG capsule, Take 1 capsule (40 mg total) by mouth 2 (two) times daily before a meal., Disp: 60 capsule, Rfl: 0   pravastatin (PRAVACHOL) 40 MG tablet, Take 40 mg by mouth every evening. , Disp: , Rfl:    Vitamin D, Ergocalciferol, (DRISDOL) 50000 units CAPS capsule, Take 50,000 Units by mouth 2 (two) times a week. Take on Sunday and Thursday, Disp: , Rfl:    acetaminophen (TYLENOL) 500 MG tablet, Take 1,000 mg by mouth every 6 (six) hours as needed for moderate pain., Disp: , Rfl:   Physical exam:  Vitals:   05/30/19 1407  BP: (!) 155/71  Pulse: 63  Temp: 97.8 F (36.6 C)  TempSrc: Tympanic  SpO2: 99%  Weight: 198 lb 8 oz (90 kg)   Physical Exam Constitutional:      Comments: Sitting in a wheelchair.  Appears in no acute distress  HENT:     Head: Normocephalic and atraumatic.  Cardiovascular:     Heart sounds: Normal heart sounds.  Pulmonary:     Effort: Pulmonary effort is normal.  Skin:    General: Skin is warm and dry.  Neurological:     Mental Status: She is alert and oriented to person, place, and time.      CMP Latest Ref Rng & Units 05/26/2019  Glucose 70 - 99 mg/dL 127(H)  BUN 8 - 23 mg/dL 14  Creatinine 0.44 - 1.00 mg/dL 1.49(H)  Sodium 135 - 145 mmol/L 138  Potassium 3.5 - 5.1 mmol/L 4.0  Chloride 98 - 111  mmol/L 104  CO2 22 - 32 mmol/L 24  Calcium 8.9 - 10.3 mg/dL 8.6(L)  Total Protein 6.5 - 8.1 g/dL 6.4(L)  Total Bilirubin 0.3 - 1.2 mg/dL 0.7  Alkaline Phos 38 - 126 U/L 89  AST 15 - 41 U/L 15  ALT 0 - 44 U/L 11  CBC Latest Ref Rng & Units 05/30/2019  WBC 4.0 - 10.5 K/uL 6.0  Hemoglobin 12.0 - 15.0 g/dL 10.5(L)  Hematocrit 36.0 - 46.0 % 35.2(L)  Platelets 150 - 400 K/uL 173    No images are attached to the encounter.  CT HEAD WO CONTRAST  Result Date: 05/17/2019 CLINICAL DATA:  Right handed weakness EXAM: CT HEAD WITHOUT CONTRAST TECHNIQUE: Contiguous axial images were obtained from the base of the skull through the vertex without intravenous contrast. COMPARISON:  05/15/2019 FINDINGS: Brain: No evidence of acute infarction, hemorrhage, hydrocephalus, extra-axial collection or mass lesion/mass effect. Extensive low-density changes within the periventricular and subcortical white matter compatible with chronic microvascular ischemic change. Mild diffuse cerebral volume loss. Vascular: Mild atherosclerotic calcifications involving the large vessels of the skull base. No unexpected hyperdense vessel. Skull: Normal. Negative for fracture or focal lesion. Sinuses/Orbits: Mild bilateral paranasal sinus mucosal thickening. Mastoid air cells are clear. Orbital structures unremarkable. Other: None. IMPRESSION: 1. No CT evidence of acute intracranial pathology. No interval change from prior 05/15/2019. 2. Chronic microvascular ischemic changes and cerebral volume loss. 3. Mild paranasal sinus disease. Electronically Signed   By: Davina Poke D.O.   On: 05/17/2019 11:20   CT Head Wo Contrast  Result Date: 05/15/2019 CLINICAL DATA:  Weakness EXAM: CT HEAD WITHOUT CONTRAST TECHNIQUE: Contiguous axial images were obtained from the base of the skull through the vertex without intravenous contrast. COMPARISON:  None. FINDINGS: Brain: There is no mass, hemorrhage or extra-axial collection. The size and  configuration of the ventricles and extra-axial CSF spaces are normal. There is hypoattenuation of the white matter, most commonly indicating chronic small vessel disease. Vascular: Atherosclerotic calcification of the internal carotid arteries at the skull base. No abnormal hyperdensity of the major intracranial arteries or dural venous sinuses. Skull: The visualized skull base, calvarium and extracranial soft tissues are normal. Sinuses/Orbits: No fluid levels or advanced mucosal thickening of the visualized paranasal sinuses. No mastoid or middle ear effusion. The orbits are normal. IMPRESSION: Chronic small vessel disease without acute intracranial abnormality. Electronically Signed   By: Ulyses Jarred M.D.   On: 05/15/2019 20:39   US Carotid Bilateral (at [facility] and [facility] only)  Result Date: 05/16/2019 CLINICAL DATA:  86 year old female with a history of stroke EXAM: BILATERAL CAROTID DUPLEX ULTRASOUND TECHNIQUE: Pearline Cables scale imaging, color Doppler and duplex ultrasound were performed of bilateral carotid and vertebral arteries in the neck. COMPARISON:  None. FINDINGS: Criteria: Quantification of carotid stenosis is based on velocity parameters that correlate the residual internal carotid diameter with NASCET-based stenosis levels, using the diameter of the distal internal carotid lumen as the denominator for stenosis measurement. The following velocity measurements were obtained: RIGHT ICA:  Systolic Q000111Q cm/sec, Diastolic 21 cm/sec CCA:  A999333 cm/sec SYSTOLIC ICA/CCA RATIO:  0.8 ECA:  184 cm/sec LEFT ICA: N/a CCA:  99991111 cm/sec SYSTOLIC ICA/CCA RATIO:  N/a ECA:  298 cm/sec Right Brachial SBP: Not acquired Left Brachial SBP: Not acquired RIGHT CAROTID ARTERY: Calcification of the right common carotid artery. Intermediate waveform maintained. Moderate heterogeneous and partially calcified plaque at the right carotid bifurcation. Mild shadowing. Low resistance waveform of the right ICA. No significant tortuosity. RIGHT  VERTEBRAL ARTERY: Antegrade flow with low resistance waveform. LEFT CAROTID ARTERY: Calcification of the left common carotid artery. High resistance waveform maintained of the CCA. Occlusion of the ICA at the bifurcation. LEFT VERTEBRAL ARTERY:  Antegrade flow with low resistance waveform. IMPRESSION: Right: Color duplex indicates moderate heterogeneous and calcified plaque, with  no hemodynamically significant stenosis by duplex criteria in the extracranial cerebrovascular circulation. 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 Signed, Dulcy Fanny. Dellia Nims, RPVI [facility] Radiology Electronically Signed   By: Corrie Mckusick D.O.   On: 05/16/2019 12:35   NM PET Image Initial (PI) Skull Base To Thigh  Result Date: 05/18/2019 CLINICAL DATA:  Initial treatment strategy for newly diagnosed gastric adenocarcinoma of the gastric cardia. Additional history of Whipple procedure in 2013 for gastric cancer. History of neuroendocrine tumor of the small bowel. EXAM: NUCLEAR MEDICINE PET SKULL BASE TO THIGH TECHNIQUE: 11.0 mCi F-18 FDG was injected intravenously. Full-ring PET imaging was performed from the skull base to thigh after the radiotracer. CT data was obtained and used for attenuation correction and anatomic localization. Fasting blood glucose: 83 mg/dl COMPARISON:  04/07/2017 CT angiogram of the chest, abdomen and pelvis. FINDINGS: Mediastinal blood pool activity: SUV max 2.7 Liver activity: SUV max NA NECK: No enlarged or hypermetabolic lymph nodes in the neck. Incidental CT findings: none CHEST: No enlarged or hypermetabolic axillary, mediastinal or hilar lymph nodes. No hypermetabolic pulmonary findings. Incidental CT findings: Mild cardiomegaly. Two lead left subclavian pacemaker with lead tips in the right atrium and right ventricular apex. Aortic valve prosthesis in place. Three-vessel coronary atherosclerosis. Atherosclerotic thoracic aorta  with ectatic 4.3 cm ascending thoracic aorta. Trace dependent bilateral pleural effusions. Mild interlobular septal thickening and patchy ground-glass opacities in both lungs. Solid 0.9 cm posterior left lower lobe pulmonary nodule (series 3/image 119), stable since 04/07/2017 chest CT and without significant FDG uptake, considered benign. ABDOMEN/PELVIS: Intensely hypermetabolic mass in the posterior gastric cardia with max SUV 27.2, poorly delineated on the CT images, approximately 3.5 x 2.5 cm (series 3/image 150). There is mild hypermetabolism (max SUV 4.1) associated with a small 0.8 cm gastrohepatic ligament lymph node near the gastric cardia (series 3/image 158). No abnormal hypermetabolic activity within the liver, pancreas, adrenal glands, or spleen. No additional hypermetabolic lymph nodes in the abdomen or pelvis. Incidental CT findings: Cholecystectomy. Partial distal gastrectomy with gastrojejunostomy. Partial pancreatectomy with apparent pancreaticojejunostomy and choledochojejunostomy. Mild sigmoid diverticulosis. Hysterectomy. Atherosclerotic nonaneurysmal abdominal aorta. SKELETON: No focal hypermetabolic activity to suggest skeletal metastasis. Incidental CT findings: none IMPRESSION: 1. Intensely hypermetabolic posterior gastric cardia mass, compatible with known primary gastric carcinoma. 2. Mild hypermetabolism associated with a small gastrohepatic ligament lymph node near the gastric cardia, compatible with local nodal metastasis. 3. Otherwise no hypermetabolic metastatic disease. 4. Mild cardiomegaly. Trace dependent bilateral pleural effusions. Mild interlobular septal thickening and patchy ground-glass opacity in the lungs, cannot exclude mild pulmonary edema due to congestive heart failure. 5. Chronic findings include: Aortic Atherosclerosis (ICD10-I70.0). Ectatic 4.3 cm ascending thoracic aorta. Mild sigmoid diverticulosis. Postsurgical changes from Whipple procedure. Electronically Signed    By: Ilona Sorrel M.D.   On: 05/18/2019 15:21   ECHOCARDIOGRAM COMPLETE  Result Date: 05/17/2019    ECHOCARDIOGRAM REPORT   Patient Name:   JOANELLE BONNEMA Date of Exam: 05/17/2019 Medical Rec #:  [id number]   Height:       72.0 in Accession #:    [id number]  Weight:       195.8 lb Date of Birth:  September 20, 1926   BSA:          2.111 m Patient Age:    37 years    BP:           104/56 mmHg Patient Gender: F  HR:           62 bpm. Exam Location:  [facility] Procedure: 2D Echo, Color Doppler and Cardiac Doppler Indications:     I163.9 Stroke  History:         Patient has prior history of Echocardiogram examinations. CAD,                  Pacemaker, PVD, CKD, Aortic Valve Disease; Risk                  Factors:Hypertension and Dyslipidemia.  Sonographer:     Charmayne Sheer RDCS (AE) Referring Phys:  [id number] Athena Masse Diagnosing Phys: Nelva Bush MD  Sonographer Comments: Suboptimal parasternal window. IMPRESSIONS  1. Left ventricular ejection fraction, by estimation, is 65 to 70%. The left ventricle has normal function. The left ventricle has no regional wall motion abnormalities. There is mild left ventricular hypertrophy. Left ventricular diastolic parameters are indeterminate.  2. Right ventricular systolic function is normal. The right ventricular size is normal. There is moderately elevated pulmonary artery systolic pressure.  3. Left atrial size was moderately dilated.  4. Right atrial size was mildly dilated.  5. The mitral valve is grossly normal. Mild to moderate mitral valve regurgitation. No evidence of mitral stenosis.  6. Tricuspid valve regurgitation is mild to moderate.  7. The aortic valve has been repaired/replaced. Aortic valve regurgitation is not visualized. Aortic valve mean gradient measures 22.0 mmHg.  8. The inferior vena cava is normal in size with <50% respiratory variability, suggesting right atrial pressure of 8 mmHg. FINDINGS  Left Ventricle: Left ventricular ejection fraction, by  estimation, is 65 to 70%. The left ventricle has normal function. The left ventricle has no regional wall motion abnormalities. The left ventricular internal cavity size was normal in size. There is  mild left ventricular hypertrophy. Left ventricular diastolic parameters are indeterminate. Right Ventricle: The right ventricular size is normal. No increase in right ventricular wall thickness. Right ventricular systolic function is normal. There is moderately elevated pulmonary artery systolic pressure. The tricuspid regurgitant velocity is 2.90 m/s, and with an assumed right atrial pressure of 8 mmHg, the estimated right ventricular systolic pressure is 99991111 mmHg. Left Atrium: Left atrial size was moderately dilated. Right Atrium: Right atrial size was mildly dilated. Pericardium: There is no evidence of pericardial effusion. Mitral Valve: The mitral valve is grossly normal. Mild mitral annular calcification. Mild to moderate mitral valve regurgitation. No evidence of mitral valve stenosis. MV peak gradient, 10.1 mmHg. The mean mitral valve gradient is 3.0 mmHg. Tricuspid Valve: The tricuspid valve is not well visualized. Tricuspid valve regurgitation is mild to moderate. Aortic Valve: There is no significant paravalvular leak. The aortic valve has been repaired/replaced. Aortic valve regurgitation is not visualized. Aortic valve mean gradient measures 22.0 mmHg. Aortic valve peak gradient measures 38.4 mmHg. Aortic valve  area, by VTI measures 0.99 cm. There is a 26 mm Edwards Sapien prosthetic, stented (TAVR) valve present in the aortic position. Pulmonic Valve: The pulmonic valve was not well visualized. Pulmonic valve regurgitation is not visualized. No evidence of pulmonic stenosis. Aorta: The aortic root is normal in size and structure. Pulmonary Artery: The pulmonary artery is not well seen. Venous: The inferior vena cava is normal in size with less than 50% respiratory variability, suggesting right atrial  pressure of 8 mmHg. IAS/Shunts: No atrial level shunt detected by color flow Doppler. Additional Comments: A pacer wire is visualized in the right ventricle.  LEFT VENTRICLE  PLAX 2D LVIDd:         4.98 cm  Diastology LVIDs:         3.35 cm  LV e' lateral:   5.11 cm/s LV PW:         1.08 cm  LV E/e' lateral: 26.7 LV IVS:        1.05 cm  LV e' medial:    7.29 cm/s LVOT diam:     1.80 cm  LV E/e' medial:  18.7 LV SV:         64 LV SV Index:   30 LVOT Area:     2.54 cm  RIGHT VENTRICLE RV Basal diam:  3.63 cm LEFT ATRIUM              Index       RIGHT ATRIUM           Index LA diam:        5.80 cm  2.75 cm/m  RA Area:     18.90 cm LA Vol (A2C):   127.0 ml 60.15 ml/m RA Volume:   50.00 ml  23.68 ml/m LA Vol (A4C):   81.1 ml  38.41 ml/m LA Biplane Vol: 101.0 ml 47.84 ml/m  AORTIC VALVE                    PULMONIC VALVE AV Area (Vmax):    0.93 cm     PV Vmax:       1.13 m/s AV Area (Vmean):   0.91 cm     PV Vmean:      72.000 cm/s AV Area (VTI):     0.99 cm     PV VTI:        0.209 m AV Vmax:           310.00 cm/s  PV Peak grad:  5.1 mmHg AV Vmean:          222.000 cm/s PV Mean grad:  2.0 mmHg AV VTI:            0.645 m AV Peak Grad:      38.4 mmHg AV Mean Grad:      22.0 mmHg LVOT Vmax:         113.00 cm/s LVOT Vmean:        79.800 cm/s LVOT VTI:          0.252 m LVOT/AV VTI ratio: 0.39  AORTA Ao Root diam: 2.60 cm MITRAL VALVE                TRICUSPID VALVE MV Area (PHT): 2.66 cm     TR Peak grad:   33.6 mmHg MV Peak grad:  10.1 mmHg    TR Vmax:        290.00 cm/s MV Mean grad:  3.0 mmHg MV Vmax:       1.59 m/s     SHUNTS MV Vmean:      74.7 cm/s    Systemic VTI:  0.25 m MV Decel Time: 286 msec     Systemic Diam: 1.80 cm MV E velocity: 136.50 cm/s Nelva Bush MD Electronically signed by Nelva Bush MD Signature Date/Time: 05/17/2019/4:45:01 PM    Final      Assessment and plan- Patient is a 86 y.o. female with adenocarcinoma of the gastric cardia/GE junction TX N1 M0 currently undergoing palliative  radiation here for routine follow-up of iron deficiency anemia  Patient underwent hemospray for upper GI bleeding from the gastric  mass and since then her hemoglobin has remained stable and in fact improved after receiving IV iron.  Hemoglobin is now up to 10 from a baseline of 7 a couple of weeks ago.  She is also undergoing palliative radiation to the gastric mass.  She does not wish to go through any chemotherapy or surgery at her age.  I have asked her to stop taking oral iron as it can cause constipation and also cause dark stools and make it difficult to recognize melena.  I will monitor her hemoglobin from time to time and see her back in 1 month with CBC ferritin and iron studies.   Visit Diagnosis 1. GE junction carcinoma ([place])   2. Iron deficiency anemia, unspecified iron deficiency anemia type      Dr. Randa Evens, MD, MPH [facility] at [facility] [id number] 06/01/2019 9:11 AM

## 2019-06-01 NOTE — Progress Notes (Signed)
PPM Remote  

## 2019-06-02 ENCOUNTER — Ambulatory Visit
Admission: RE | Admit: 2019-06-02 | Discharge: 2019-06-02 | Disposition: A | Discharge status: Home / Self Care | Payer: Medicare Other | Source: Ambulatory Visit | Attending: Radiation Oncology | Admitting: Radiation Oncology

## 2019-06-02 DIAGNOSIS — C160 Malignant neoplasm of cardia: Secondary | ICD-10-CM | POA: Diagnosis not present

## 2019-06-03 ENCOUNTER — Ambulatory Visit
Admission: RE | Admit: 2019-06-03 | Discharge: 2019-06-03 | Disposition: A | Discharge status: Home / Self Care | Payer: Medicare Other | Source: Ambulatory Visit | Attending: Radiation Oncology | Admitting: Radiation Oncology

## 2019-06-03 DIAGNOSIS — C160 Malignant neoplasm of cardia: Secondary | ICD-10-CM | POA: Diagnosis not present

## 2019-06-05 NOTE — Progress Notes (Signed)
Cardiology Office Note  Date:  06/06/2019   ID:  Theresa Cain, DOB 03-Jan-1927, MRN [id number]  PCP:  Kendrick Ranch, MD   Chief Complaint  Patient presents with  . office visit    pt had a storke after recent ED visit (05/15/19). Meds verbally reviewed w/ pt.    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  MRIcan not be performed due to presence of pacemaker.  Neurology consult. rec repeat CT head which showed no acute finding  Carotid Dopplers showed no significant stenosis on the right, and ICA occlusion at the left bifurcation.   Restarted on eliquis 2.5 BID by oncology  Home BP 140 to 150 Elevated on today's visit 180/98  Takes torsemide if weight up to 2-3 points Denies significant leg swelling  EKG personally reviewed by myself on todays visit Shows atrial fibrillation, paced complexes rate 63 bpm   cardiac catheterization 03/27/2017 multivessel coronary disease with 40% stenosis of the LAD, high-grade stenosis of a diagonal branch, and 75% stenosis of the distal right coronary artery  s/p successful TAVR with a 21mm Edwards Sapien 3 THV via the TF approach on 05/12/17. Post operative echo shows normal LV systolic  function, normal function of her transcatheter aortic valve with a mean gradient of 9 mmHg and trivial paravalvular regurgitation.    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  PSH:    Past Surgical History:  Procedure Laterality Date  . CARDIOVASCULAR STRESS TEST    . CARDIOVERSION N/A 08/08/2016   Procedure: CARDIOVERSION;  Surgeon: Sueanne Margarita, MD;  Location[facility] ENDOSCOPY;  Service: Cardiovascular;  Laterality: N/A;  . CAROTID ENDARTERECTOMY Right ~ 2005  . CATARACT EXTRACTION W/ INTRAOCULAR LENS IMPLANT Right   . CHOLECYSTECTOMY  ~ 2013   "part of her [place] OR"  . EP IMPLANTABLE DEVICE N/A 03/04/2016   Procedure: Pacemaker Implant;  Surgeon: Will Meredith Leeds, MD;  Location: [place] CV LAB;  Service: Cardiovascular;  Laterality: N/A;  . EP IMPLANTABLE DEVICE N/A 03/05/2016   Procedure: Lead Revision/Repair;  Surgeon: Deboraha Sprang, MD;  Location: [place] CV LAB;  Service: Cardiovascular;  Laterality: N/A;  . ESOPHAGOGASTRODUODENOSCOPY (EGD) WITH PROPOFOL N/A 05/13/2019   Procedure: ESOPHAGOGASTRODUODENOSCOPY (EGD) WITH PROPOFOL;  Surgeon: Jonathon Bellows, MD;  Location: [facility] ENDOSCOPY;  Service: Gastroenterology;  Laterality: N/A;  . INSERT / REPLACE / REMOVE PACEMAKER    .  LEFT HEART CATH AND CORONARY ANGIOGRAPHY N/A 03/27/2017   Procedure: LEFT HEART  CATH AND CORONARY ANGIOGRAPHY;  Surgeon: Minna Merritts, MD;  Location: [place] CV LAB;  Service: Cardiovascular;  Laterality: N/A;  . TEE WITHOUT CARDIOVERSION N/A 05/12/2017   Procedure: TRANSESOPHAGEAL ECHOCARDIOGRAM (TEE);  Surgeon: Sherren Mocha, MD;  Location: [place];  Service: Open Heart Surgery;  Laterality: N/A;  . TRANSCATHETER AORTIC VALVE REPLACEMENT, TRANSFEMORAL N/A 05/12/2017   Procedure: TRANSCATHETER AORTIC VALVE REPLACEMENT, TRANSFEMORAL using a 50mm Edwards Sapien 3 Aortic Valve;  Surgeon: Sherren Mocha, MD;  Location: [place];  Service: Open Heart Surgery;  Laterality: N/A;  . US ECHOCARDIOGRAPHY    . VAGINAL HYSTERECTOMY    . WHIPPLE PROCEDURE  ~ 2013    Current Outpatient Medications  Medication Sig Dispense Refill  . acetaminophen (TYLENOL) 500 MG tablet Take 1,000 mg by mouth every 6 (six) hours as needed for moderate pain.    Marland Kitchen diltiazem (CARDIZEM CD) 180 MG 24 hr capsule Take 1 capsule (180 mg total) by mouth daily. 90 capsule 3  . ELIQUIS 2.5 MG TABS tablet Hold until further notice per your outpatient doctor, due to your GI bleeding. Taking eliquis 2.5 twice a day 60 tablet   . escitalopram (LEXAPRO) 10 MG tablet Take 10 mg by mouth at bedtime.     Marland Kitchen levothyroxine (SYNTHROID) 50 MCG tablet Take 1 tablet (50 mcg total) by mouth daily. 30 tablet 0  . omeprazole (PRILOSEC) 40 MG capsule Take 1 capsule (40 mg total) by mouth 2 (two) times daily before a meal. 60 capsule 0  . pravastatin (PRAVACHOL) 40 MG tablet Take 40 mg by mouth every evening.     . Vitamin D, Ergocalciferol, (DRISDOL) 50000 units CAPS capsule Take 50,000 Units by mouth 2 (two) times a week. Take on Sunday and Thursday     No current facility-administered medications for this visit.     Allergies:   Penicillins, Sulfa antibiotics, and Cymbalta [duloxetine hcl]   Social History:  The patient  reports that she has never smoked. She has never used smokeless tobacco. She reports that she does  not drink alcohol or use drugs.   Family History:   family history includes Breast cancer in her sister; Cerebral aneurysm in her mother; Diabetes Mellitus II in her sister; Healthy in her father; Heart disease in her sister; Lung cancer in her brother; Throat cancer in her brother.    Review of Systems: Review of Systems  Constitutional: Negative.   Respiratory: Negative.   Cardiovascular: Negative.   Gastrointestinal: Negative.   Musculoskeletal: Negative.        Gait instability  Neurological: Negative.   Psychiatric/Behavioral: Negative.   All other systems reviewed and are negative.    PHYSICAL EXAM: VS:  BP (!) 180/98 (BP Location: Left Arm, Patient Position: Sitting, Cuff Size: Normal)   Pulse 63   Ht 6' (1.829 m)   Wt 198 lb 8 oz (90 kg)   SpO2 96%   BMI 26.92 kg/m  , BMI Body mass index is 26.92 kg/m. Constitutional:  oriented to person, place, and time. No distress.  HENT:  Head: Grossly normal Eyes:  no discharge. No scleral icterus.  Neck: No JVD, no carotid bruits  Cardiovascular: Irregularly irregular no murmurs appreciated Pulmonary/Chest: Clear to auscultation bilaterally, no wheezes or rails Abdominal: Soft.  no distension.  no tenderness.  Musculoskeletal: Normal range of motion Neurological:  normal muscle tone. Coordination normal. No atrophy Skin: Skin warm and dry  Psychiatric: normal affect, pleasant   Recent Labs: 05/10/2019: B Natriuretic Peptide 309.0 05/17/2019: Magnesium 2.1 05/26/2019: ALT 11; BUN 14; Creatinine, Ser 1.49; Potassium 4.0; Sodium 138 05/30/2019: Hemoglobin 10.5; Platelets 173    Lipid Panel Lab Results  Component Value Date   CHOL 121 05/16/2019   HDL 41 05/16/2019   LDLCALC 58 05/16/2019   TRIG 112 05/16/2019      Wt Readings from Last 3 Encounters:  06/06/19 198 lb 8 oz (90 kg)  05/30/19 198 lb 8 oz (90 kg)  05/19/19 198 lb (89.8 kg)       ASSESSMENT AND PLAN:  Chronic diastolic CHF (congestive heart failure)  ([place]) - Plan: Recommend she continue to take torsemide 20 mg as needed Stable renal function creatinine 1.5  Permanent atrial fibrillation ([place]) - Plan: EKG 12-Lead Back on anticoagulation Eliquis 2.5 twice daily Lower dose given creatinine periodically above 1.5 High risk of bleeding  S/P TAVR (transcatheter aortic valve replacement) for aortic valve stenosis Echocardiogram March 2019 well-seated valve Stable valve May 17, 2019, mean gradient 20 mmHg  Essential hypertension Markedly elevated blood pressures on today's visit, Reports is well controlled at home No changes to her medications She will continue to monitor blood pressure for [place] We have given her hydralazine 25 mg 3 times daily as needed for pressure over 160  Coronary artery disease of native artery of native heart with stable angina pectoris (HCC) Currently with no symptoms of angina. No further workup at this time. Continue current medication regimen. On pravastatin  Chronic kidney disease, unspecified CKD stage Stable renal function creatinine 1.5  Disposition:   F/U  6 months  Long review of Hospitalrecords, discussed cancer therapy, management of blood pressure, anemia  Total encounter time more than 45 minutes  Greater than 50% was spent in counseling and coordination of care with the patient    Orders Placed This Encounter  Procedures  . EKG 12-Lead     Signed, Esmond Plants, M.D., Ph.D. 06/06/2019  [place], [place]

## 2019-06-06 ENCOUNTER — Ambulatory Visit
Admission: RE | Admit: 2019-06-06 | Discharge: 2019-06-06 | Disposition: A | Discharge status: Home / Self Care | Payer: Medicare Other | Source: Ambulatory Visit | Attending: Radiation Oncology | Admitting: Radiation Oncology

## 2019-06-06 ENCOUNTER — Other Ambulatory Visit: Payer: Self-pay

## 2019-06-06 ENCOUNTER — Ambulatory Visit (INDEPENDENT_AMBULATORY_CARE_PROVIDER_SITE_OTHER): Payer: Medicare Other | Admitting: Cardiovascular Disease

## 2019-06-06 ENCOUNTER — Encounter: Payer: Self-pay | Admitting: Cardiovascular Disease

## 2019-06-06 VITALS — BP 180/98 | HR 63 | Ht 72.0 in | Wt 198.5 lb

## 2019-06-06 DIAGNOSIS — Z952 Presence of prosthetic heart valve: Secondary | ICD-10-CM

## 2019-06-06 DIAGNOSIS — I5032 Chronic diastolic (congestive) heart failure: Secondary | ICD-10-CM

## 2019-06-06 DIAGNOSIS — I639 Cerebral infarction, unspecified: Secondary | ICD-10-CM

## 2019-06-06 DIAGNOSIS — C160 Malignant neoplasm of cardia: Secondary | ICD-10-CM | POA: Diagnosis not present

## 2019-06-06 DIAGNOSIS — I712 Thoracic aortic aneurysm, without rupture, unspecified: Secondary | ICD-10-CM

## 2019-06-06 DIAGNOSIS — I4821 Permanent atrial fibrillation: Secondary | ICD-10-CM | POA: Diagnosis not present

## 2019-06-06 DIAGNOSIS — I495 Sick sinus syndrome: Secondary | ICD-10-CM | POA: Diagnosis not present

## 2019-06-06 DIAGNOSIS — I10 Essential (primary) hypertension: Secondary | ICD-10-CM

## 2019-06-06 DIAGNOSIS — I350 Nonrheumatic aortic (valve) stenosis: Secondary | ICD-10-CM

## 2019-06-06 MED ORDER — HYDRALAZINE HCL 25 MG PO TABS: 25.0000 mg | ORAL_TABLET | Freq: Three times a day (TID) | ORAL | 3 refills | Status: AC | PRN

## 2019-06-06 MED ORDER — TORSEMIDE 20 MG PO TABS: 20.0000 mg | ORAL_TABLET | Freq: Every day | ORAL | 1 refills | Status: DC | PRN

## 2019-06-06 MED ORDER — OMEPRAZOLE 40 MG PO CPDR: 40.0000 mg | DELAYED_RELEASE_CAPSULE | Freq: Two times a day (BID) | ORAL | 3 refills | Status: AC

## 2019-06-06 NOTE — Patient Instructions (Addendum)
Medication Instructions:  Please monitor blood pressure  Please take hydralazine 25 mg up to three times a day as needed for high blood pressure  >160-170 on top number   If you need a refill on your cardiac medications before your next appointment, please call your pharmacy.    Lab work: No new labs needed   If you have labs (blood work) drawn today and your tests are completely normal, you will receive your results only by: Marland Kitchen MyChart Message (if you have MyChart) OR . A paper copy in the mail If you have any lab test that is abnormal or we need to change your treatment, we will call you to review the results.   Testing/Procedures: No new testing needed   Follow-Up: At [facility], you and your health needs are our priority.  As part of our continuing mission to provide you with exceptional heart care, we have created designated Provider Care Teams.  These Care Teams include your primary Cardiologist (physician) and Advanced Practice Providers (APPs -  Physician Assistants and Nurse Practitioners) who all work together to provide you with the care you need, when you need it.  . You will need a follow up appointment in 6 months  . Providers on your designated Care Team:   . Murray Hodgkins, NP . Christell Faith, PA-C . Marrianne Mood, PA-C  Any Other Special Instructions Will Be Listed Below (If Applicable).  For educational health videos Log in to : [email or web address] Or : [email or web address], password : [email or web address]   How to Take Your Blood Pressure You can take your blood pressure at home with a machine. You may need to check your blood pressure at home:  To check if you have high blood pressure (hypertension).  To check your blood pressure over time.  To make sure your blood pressure medicine is working. Supplies needed: You will need a blood pressure machine, or monitor. You can buy one at a drugstore or online. When choosing one:  Choose one with an arm cuff.  Choose one  that wraps around your upper arm. Only one finger should fit between your arm and the cuff.  Do not choose one that measures your blood pressure from your wrist or finger. Your doctor can suggest a monitor. How to prepare Avoid these things for 30 minutes before checking your blood pressure:  Drinking caffeine.  Drinking alcohol.  Eating.  Smoking.  Exercising. Five minutes before checking your blood pressure:  Pee.  Sit in a dining chair. Avoid sitting in a soft couch or armchair.  Be quiet. Do not talk. How to take your blood pressure Follow the instructions that came with your machine. If you have a digital blood pressure monitor, these may be the instructions: 1. Sit up straight. 2. Place your feet on the floor. Do not cross your ankles or legs. 3. Rest your left arm at the level of your heart. You may rest it on a table, desk, or chair. 4. Pull up your shirt sleeve. 5. Wrap the blood pressure cuff around the upper part of your left arm. The cuff should be 1 inch (2.5 cm) above your elbow. It is best to wrap the cuff around bare skin. 6. Fit the cuff snugly around your arm. You should be able to place only one finger between the cuff and your arm. 7. Put the cord inside the groove of your elbow. 8. Press the power button. 9. Sit quietly while the cuff fills with air and loses  air. 10. Write down the numbers on the screen. 11. Wait 2-3 minutes and then repeat steps 1-10. What do the numbers mean? Two numbers make up your blood pressure. The first number is called systolic pressure. The second is called diastolic pressure. An example of a blood pressure reading is "120 over 80" (or 120/80). If you are an adult and do not have a medical condition, use this guide to find out if your blood pressure is normal: Normal  First number: below 120.  Second number: below 80. Elevated  First number: 120-129.  Second number: below 80. Hypertension stage 1  First number:  130-139.  Second number: 80-89. Hypertension stage 2  First number: 140 or above.  Second number: 39 or above. Your blood pressure is above normal even if only the top or bottom number is above normal. Follow these instructions at home:  Check your blood pressure as often as your doctor tells you to.  Take your monitor to your next doctor's appointment. Your doctor will: ? Make sure you are using it correctly. ? Make sure it is working right.  Make sure you understand what your blood pressure numbers should be.  Tell your doctor if your medicines are causing side effects. Contact a doctor if:  Your blood pressure keeps being high. Get help right away if:  Your first blood pressure number is higher than 180.  Your second blood pressure number is higher than 120. This information is not intended to replace advice given to you by your health care provider. Make sure you discuss any questions you have with your health care provider. Document Revised: 02/20/2017 Document Reviewed: 08/17/2015 Elsevier Patient Education  2020 [employer].

## 2019-06-07 ENCOUNTER — Ambulatory Visit
Admission: RE | Admit: 2019-06-07 | Discharge: 2019-06-07 | Disposition: A | Discharge status: Home / Self Care | Payer: Medicare Other | Source: Ambulatory Visit | Attending: Radiation Oncology | Admitting: Radiation Oncology

## 2019-06-07 DIAGNOSIS — C160 Malignant neoplasm of cardia: Secondary | ICD-10-CM | POA: Diagnosis not present

## 2019-06-08 ENCOUNTER — Ambulatory Visit
Admission: RE | Admit: 2019-06-08 | Discharge: 2019-06-08 | Disposition: A | Discharge status: Home / Self Care | Payer: Medicare Other | Source: Ambulatory Visit | Attending: Radiation Oncology | Admitting: Radiation Oncology

## 2019-06-08 DIAGNOSIS — C160 Malignant neoplasm of cardia: Secondary | ICD-10-CM | POA: Diagnosis not present

## 2019-06-09 ENCOUNTER — Ambulatory Visit
Admission: RE | Admit: 2019-06-09 | Discharge: 2019-06-09 | Disposition: A | Discharge status: Home / Self Care | Payer: Medicare Other | Source: Ambulatory Visit | Attending: Radiation Oncology | Admitting: Radiation Oncology

## 2019-06-09 DIAGNOSIS — C160 Malignant neoplasm of cardia: Secondary | ICD-10-CM | POA: Diagnosis not present

## 2019-06-10 ENCOUNTER — Ambulatory Visit
Admission: RE | Admit: 2019-06-10 | Discharge: 2019-06-10 | Disposition: A | Discharge status: Home / Self Care | Payer: Medicare Other | Source: Ambulatory Visit | Attending: Radiation Oncology | Admitting: Radiation Oncology

## 2019-06-10 DIAGNOSIS — C160 Malignant neoplasm of cardia: Secondary | ICD-10-CM | POA: Diagnosis not present

## 2019-06-13 ENCOUNTER — Ambulatory Visit
Admission: RE | Admit: 2019-06-13 | Discharge: 2019-06-13 | Disposition: A | Discharge status: Home / Self Care | Payer: Medicare Other | Source: Ambulatory Visit | Attending: Radiation Oncology | Admitting: Radiation Oncology

## 2019-06-13 DIAGNOSIS — C160 Malignant neoplasm of cardia: Secondary | ICD-10-CM | POA: Diagnosis not present

## 2019-06-14 ENCOUNTER — Ambulatory Visit
Admission: RE | Admit: 2019-06-14 | Discharge: 2019-06-14 | Disposition: A | Discharge status: Home / Self Care | Payer: Medicare Other | Source: Ambulatory Visit | Attending: Radiation Oncology | Admitting: Radiation Oncology

## 2019-06-14 DIAGNOSIS — C160 Malignant neoplasm of cardia: Secondary | ICD-10-CM | POA: Diagnosis not present

## 2019-06-15 ENCOUNTER — Ambulatory Visit
Admission: RE | Admit: 2019-06-15 | Discharge: 2019-06-15 | Disposition: A | Discharge status: Home / Self Care | Payer: Medicare Other | Source: Ambulatory Visit | Attending: Radiation Oncology | Admitting: Radiation Oncology

## 2019-06-15 DIAGNOSIS — C160 Malignant neoplasm of cardia: Secondary | ICD-10-CM | POA: Diagnosis not present

## 2019-06-16 ENCOUNTER — Ambulatory Visit
Admission: RE | Admit: 2019-06-16 | Discharge: 2019-06-16 | Disposition: A | Discharge status: Home / Self Care | Payer: Medicare Other | Source: Ambulatory Visit | Attending: Radiation Oncology | Admitting: Radiation Oncology

## 2019-06-16 DIAGNOSIS — C160 Malignant neoplasm of cardia: Secondary | ICD-10-CM | POA: Diagnosis not present

## 2019-06-17 ENCOUNTER — Ambulatory Visit
Admission: RE | Admit: 2019-06-17 | Discharge: 2019-06-17 | Disposition: A | Discharge status: Home / Self Care | Payer: Medicare Other | Source: Ambulatory Visit | Attending: Radiation Oncology | Admitting: Radiation Oncology

## 2019-06-17 DIAGNOSIS — C160 Malignant neoplasm of cardia: Secondary | ICD-10-CM | POA: Diagnosis not present

## 2019-06-20 ENCOUNTER — Ambulatory Visit
Admission: RE | Admit: 2019-06-20 | Discharge: 2019-06-20 | Disposition: A | Discharge status: Home / Self Care | Payer: Medicare Other | Source: Ambulatory Visit | Attending: Radiation Oncology | Admitting: Radiation Oncology

## 2019-06-20 DIAGNOSIS — C160 Malignant neoplasm of cardia: Secondary | ICD-10-CM | POA: Diagnosis not present

## 2019-06-21 ENCOUNTER — Ambulatory Visit
Admission: RE | Admit: 2019-06-21 | Discharge: 2019-06-21 | Disposition: A | Discharge status: Home / Self Care | Payer: Medicare Other | Source: Ambulatory Visit | Attending: Radiation Oncology | Admitting: Radiation Oncology

## 2019-06-21 DIAGNOSIS — C160 Malignant neoplasm of cardia: Secondary | ICD-10-CM | POA: Diagnosis not present

## 2019-06-22 ENCOUNTER — Ambulatory Visit
Admission: RE | Admit: 2019-06-22 | Discharge: 2019-06-22 | Disposition: A | Discharge status: Home / Self Care | Payer: Medicare Other | Source: Ambulatory Visit | Attending: Radiation Oncology | Admitting: Radiation Oncology

## 2019-06-22 DIAGNOSIS — C160 Malignant neoplasm of cardia: Secondary | ICD-10-CM | POA: Diagnosis not present

## 2019-06-23 ENCOUNTER — Ambulatory Visit
Admission: RE | Admit: 2019-06-23 | Discharge: 2019-06-23 | Disposition: A | Discharge status: Home / Self Care | Payer: Medicare Other | Source: Ambulatory Visit | Attending: Radiation Oncology | Admitting: Radiation Oncology

## 2019-06-23 DIAGNOSIS — C160 Malignant neoplasm of cardia: Secondary | ICD-10-CM | POA: Insufficient documentation

## 2019-06-24 ENCOUNTER — Ambulatory Visit
Admission: RE | Admit: 2019-06-24 | Discharge: 2019-06-24 | Disposition: A | Discharge status: Home / Self Care | Payer: Medicare Other | Source: Ambulatory Visit | Attending: Radiation Oncology | Admitting: Radiation Oncology

## 2019-06-24 DIAGNOSIS — C160 Malignant neoplasm of cardia: Secondary | ICD-10-CM | POA: Diagnosis not present

## 2019-06-27 ENCOUNTER — Ambulatory Visit
Admission: RE | Admit: 2019-06-27 | Discharge: 2019-06-27 | Disposition: A | Discharge status: Home / Self Care | Payer: Medicare Other | Source: Ambulatory Visit | Attending: Radiation Oncology | Admitting: Radiation Oncology

## 2019-06-27 DIAGNOSIS — C160 Malignant neoplasm of cardia: Secondary | ICD-10-CM | POA: Diagnosis not present

## 2019-06-28 ENCOUNTER — Ambulatory Visit
Admission: RE | Admit: 2019-06-28 | Discharge: 2019-06-28 | Disposition: A | Discharge status: Home / Self Care | Payer: Medicare Other | Source: Ambulatory Visit | Attending: Radiation Oncology | Admitting: Radiation Oncology

## 2019-06-28 DIAGNOSIS — C160 Malignant neoplasm of cardia: Secondary | ICD-10-CM | POA: Diagnosis not present

## 2019-06-29 ENCOUNTER — Ambulatory Visit
Admission: RE | Admit: 2019-06-29 | Discharge: 2019-06-29 | Disposition: A | Discharge status: Home / Self Care | Payer: Medicare Other | Source: Ambulatory Visit | Attending: Radiation Oncology | Admitting: Radiation Oncology

## 2019-06-29 DIAGNOSIS — C160 Malignant neoplasm of cardia: Secondary | ICD-10-CM | POA: Diagnosis not present

## 2019-06-30 ENCOUNTER — Inpatient Hospital Stay: Payer: Medicare Other | Attending: Oncology

## 2019-06-30 ENCOUNTER — Ambulatory Visit
Admission: RE | Admit: 2019-06-30 | Discharge: 2019-06-30 | Disposition: A | Discharge status: Home / Self Care | Payer: Medicare Other | Source: Ambulatory Visit | Attending: Radiation Oncology | Admitting: Radiation Oncology

## 2019-06-30 ENCOUNTER — Other Ambulatory Visit: Payer: Self-pay

## 2019-06-30 ENCOUNTER — Inpatient Hospital Stay: Payer: Medicare Other | Attending: Oncology | Admitting: Oncology

## 2019-06-30 ENCOUNTER — Encounter: Payer: Self-pay | Admitting: Oncology

## 2019-06-30 VITALS — BP 144/67 | HR 61 | Temp 98.7°F | Ht 72.0 in | Wt 193.0 lb

## 2019-06-30 DIAGNOSIS — Z7901 Long term (current) use of anticoagulants: Secondary | ICD-10-CM | POA: Insufficient documentation

## 2019-06-30 DIAGNOSIS — Z8673 Personal history of transient ischemic attack (TIA), and cerebral infarction without residual deficits: Secondary | ICD-10-CM | POA: Insufficient documentation

## 2019-06-30 DIAGNOSIS — I4821 Permanent atrial fibrillation: Secondary | ICD-10-CM | POA: Insufficient documentation

## 2019-06-30 DIAGNOSIS — I639 Cerebral infarction, unspecified: Secondary | ICD-10-CM

## 2019-06-30 DIAGNOSIS — D509 Iron deficiency anemia, unspecified: Secondary | ICD-10-CM | POA: Insufficient documentation

## 2019-06-30 DIAGNOSIS — C160 Malignant neoplasm of cardia: Secondary | ICD-10-CM | POA: Insufficient documentation

## 2019-06-30 LAB — CBC WITH DIFFERENTIAL/PLATELET
Abs Immature Granulocytes: 0.02 10*3/uL (ref 0.00–0.07)
Basophils Absolute: 0 10*3/uL (ref 0.0–0.1)
Basophils Relative: 1 %
Eosinophils Absolute: 0.2 10*3/uL (ref 0.0–0.5)
Eosinophils Relative: 4 %
HCT: 36.2 % (ref 36.0–46.0)
Hemoglobin: 11.9 g/dL — ABNORMAL LOW (ref 12.0–15.0)
Immature Granulocytes: 0 %
Lymphocytes Relative: 5 %
Lymphs Abs: 0.3 10*3/uL — ABNORMAL LOW (ref 0.7–4.0)
MCH: 28 pg (ref 26.0–34.0)
MCHC: 32.9 g/dL (ref 30.0–36.0)
MCV: 85.2 fL (ref 80.0–100.0)
Monocytes Absolute: 0.5 10*3/uL (ref 0.1–1.0)
Monocytes Relative: 9 %
Neutro Abs: 4.4 10*3/uL (ref 1.7–7.7)
Neutrophils Relative %: 81 %
Platelets: 129 10*3/uL — ABNORMAL LOW (ref 150–400)
RBC: 4.25 MIL/uL (ref 3.87–5.11)
RDW: 14.9 % (ref 11.5–15.5)
WBC: 5.4 10*3/uL (ref 4.0–10.5)
nRBC: 0 % (ref 0.0–0.2)

## 2019-06-30 LAB — IRON AND TIBC
Iron: 49 ug/dL (ref 28–170)
Saturation Ratios: 16 % (ref 10.4–31.8)
TIBC: 314 ug/dL (ref 250–450)
UIBC: 265 ug/dL

## 2019-06-30 LAB — FERRITIN: Ferritin: 57 ng/mL (ref 11–307)

## 2019-06-30 NOTE — Progress Notes (Signed)
Patient stated that she had been doing well but her feet continue to ache.

## 2019-06-30 NOTE — Progress Notes (Signed)
Spoke with daughter she assisted with getting patient checked in, patient is doing well no complaints

## 2019-07-01 ENCOUNTER — Ambulatory Visit
Admission: RE | Admit: 2019-07-01 | Discharge: 2019-07-01 | Disposition: A | Discharge status: Home / Self Care | Payer: Medicare Other | Source: Ambulatory Visit | Attending: Radiation Oncology | Admitting: Radiation Oncology

## 2019-07-01 DIAGNOSIS — C160 Malignant neoplasm of cardia: Secondary | ICD-10-CM | POA: Diagnosis not present

## 2019-07-01 NOTE — Progress Notes (Signed)
Hematology/Oncology Consult note [facility]  Telephone:([phone] [phone]  Patient Care Team: Kendrick Ranch, MD as PCP - General (Internal Medicine) Constance Haw, MD as PCP - Electrophysiology (Cardiology) Sherren Mocha, MD as PCP - Cardiology (Cardiology) Sindy Guadeloupe, MD as Consulting Physician (Hematology and Oncology) Clent Jacks, RN as Oncology Nurse Navigator   Name of the patient: Theresa Cain  [id number]  Jan 28, 1927   Date of visit: 07/01/19  Diagnosis- gastric adenocarcinoma GE junction [id number] s/p palliative radiation   Chief complaint/ Reason for visit-routine follow-up of gastric cancer and iron deficiency anemia  Heme/Onc history: Patient is a 86 year old female with prior h/o neuroendocrine tumor in 2014.She presented with acute upper GI bleed s/p EGD which showedmass in gastric cardia just below the GE junctionand acute upper GI bleed s/p hemospray. Was discharged and eliquis was held and came with wrist drop- radial nerve palsy versus acute ischemic stroke per neurology. Pathology from gastric mass shows gastric adenocarcinoma. Wrist drop has resolved but hand grip remains weak  PET scan showed hypermetabolism in the gastric cardia and small LN in the gastrohepatic ligament  Patient decided to opt for palliative RT alone. No chemo or surgical consideration  Interval history-patient reports tolerating radiation treatment well.  She continues to live alone and is independent of her ADLs and needs some assistance with her IADLs.  Reports some pain in her bilateral legs which makes it difficult for her to walk around.  Denies any blood in stools or dark melanotic stools.  ECOG PS- 1 Pain scale- 0   Review of systems- Review of Systems  Constitutional: Positive for malaise/fatigue. Negative for chills, fever and weight loss.  HENT: Negative for congestion, ear discharge and nosebleeds.   Eyes:  Negative for blurred vision.  Respiratory: Negative for cough, hemoptysis, sputum production, shortness of breath and wheezing.   Cardiovascular: Negative for chest pain, palpitations, orthopnea and claudication.  Gastrointestinal: Negative for abdominal pain, blood in stool, constipation, diarrhea, heartburn, melena, nausea and vomiting.  Genitourinary: Negative for dysuria, flank pain, frequency, hematuria and urgency.  Musculoskeletal: Negative for back pain, joint pain and myalgias.  Skin: Negative for rash.  Neurological: Negative for dizziness, tingling, focal weakness, seizures, weakness and headaches.  Endo/Heme/Allergies: Does not bruise/bleed easily.  Psychiatric/Behavioral: Negative for depression and suicidal ideas. The patient does not have insomnia.       Allergies  Allergen Reactions  . Penicillins Hives and Other (See Comments)    PATIENT HAS HAD A PCN REACTION WITH IMMEDIATE RASH, FACIAL/TONGUE/THROAT SWELLING, SOB, OR LIGHTHEADEDNESS WITH HYPOTENSION:  #  #  #  YES  #  #  #   Has patient had a PCN reaction causing severe rash involving mucus membranes or skin necrosis: No Has patient had a PCN reaction that required hospitalization: No Has patient had a PCN reaction occurring within the last 10 years: No If all of the above answers are "NO", then may proceed with Cephalosporin use.   . Sulfa Antibiotics Hives  . Cymbalta [Duloxetine Hcl]     Sweat, feels like having a heart attack      Past Medical History:  Diagnosis Date  . Acute on chronic diastolic CHF (congestive heart failure) ([place]) 02/22/2016  . Anemia   . Bradycardia   . Carotid artery occlusion   . Chronic diastolic CHF (congestive heart failure) ([place])   . Chronic kidney disease    elevated creatinne  . Coronary artery disease  involving native coronary artery of native heart   . GERD (gastroesophageal reflux disease)   . HLD (hyperlipidemia)   . Hypertension   . Hypothyroidism   . Obesity   .  Osteoarthritis   . Permanent atrial fibrillation ([place])   . Presence of permanent cardiac pacemaker   . PVD (peripheral vascular disease) ([place])   . 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 Severe aortic stenosis   . Stomach cancer ([place]) ~ 2013  . Vertigo      Past Surgical History:  Procedure Laterality Date  . CARDIOVASCULAR STRESS TEST    . CARDIOVERSION N/A 08/08/2016   Procedure: CARDIOVERSION;  Surgeon: Sueanne Margarita, MD;  Location: [facility] ENDOSCOPY;  Service: Cardiovascular;  Laterality: N/A;  . CAROTID ENDARTERECTOMY Right ~ 2005  . CATARACT EXTRACTION W/ INTRAOCULAR LENS IMPLANT Right   . CHOLECYSTECTOMY  ~ 2013   "part of her [facility] OR"  . EP IMPLANTABLE DEVICE N/A 03/04/2016   Procedure: Pacemaker Implant;  Surgeon: Will Meredith Leeds, MD;  Location: [place] [facility] CV LAB;  Service: Cardiovascular;  Laterality: N/A;  . EP IMPLANTABLE DEVICE N/A 03/05/2016   Procedure: Lead Revision/Repair;  Surgeon: Deboraha Sprang, MD;  Location: [place] CV LAB;  Service: Cardiovascular;  Laterality: N/A;  . ESOPHAGOGASTRODUODENOSCOPY (EGD) WITH PROPOFOL N/A 05/13/2019   Procedure: ESOPHAGOGASTRODUODENOSCOPY (EGD) WITH PROPOFOL;  Surgeon: Jonathon Bellows, MD;  Location: [facility] ENDOSCOPY;  Service: Gastroenterology;  Laterality: N/A;  . INSERT / REPLACE / REMOVE PACEMAKER    . LEFT HEART CATH AND CORONARY ANGIOGRAPHY N/A 03/27/2017   Procedure: LEFT HEART CATH AND CORONARY ANGIOGRAPHY;  Surgeon: Minna Merritts, MD;  Location: [place] CV LAB;  Service: Cardiovascular;  Laterality: N/A;  . TEE WITHOUT CARDIOVERSION N/A 05/12/2017   Procedure: TRANSESOPHAGEAL ECHOCARDIOGRAM (TEE);  Surgeon: Sherren Mocha, MD;  Location: [facility];  Service: Open Heart Surgery;  Laterality: N/A;  . TRANSCATHETER AORTIC VALVE REPLACEMENT, TRANSFEMORAL N/A 05/12/2017   Procedure: TRANSCATHETER  AORTIC VALVE REPLACEMENT, TRANSFEMORAL using a 54m Edwards Sapien 3 Aortic Valve;  Surgeon: CSherren Mocha MD;  Location: [place]  Service: Open Heart Surgery;  Laterality: N/A;  . [place]CHOCARDIOGRAPHY    . VAGINAL HYSTERECTOMY    . WHIPPLE PROCEDURE  ~ 2013    Social History   Socioeconomic History  . Marital status: Widowed    Spouse name: Not on file  . Number of children: Not on file  . Years of education: Not on file  . Highest education level: Not on file  Occupational History  . Not on file  Tobacco Use  . Smoking status: Never Smoker  . Smokeless tobacco: Never Used  Substance and Sexual Activity  . Alcohol use: No  . Drug use: No  . Sexual activity: Never  Other Topics Concern  . Not on file  Social History Narrative  . Not on file   Social Determinants of Health   Financial Resource Strain:   . Difficulty of Paying Living Expenses:   Food Insecurity:   . Worried About RCharity fundraiserin the Last Year:   . RArboriculturistin the Last Year:   Transportation Needs:   . LFilm/video editor(Medical):   .Marland KitchenLack of Transportation (Non-Medical):   Physical Activity:   . Days of Exercise per Week:   . Minutes of Exercise per Session:   Stress:   .  Feeling of Stress :   Social Connections:   . Frequency of Communication with Friends and Family:   . Frequency of Social Gatherings with Friends and Family:   . Attends Religious Services:   . Active Member of Clubs or Organizations:   . Attends Archivist Meetings:   Marland Kitchen Marital Status:   Intimate Partner Violence:   . Fear of Current or Ex-Partner:   . Emotionally Abused:   Marland Kitchen Physically Abused:   . Sexually Abused:     Family History  Problem Relation Age of Onset  . Heart disease Sister   . Breast cancer Sister   . Diabetes Mellitus II Sister   . Cerebral aneurysm Mother   . Healthy Father   . Lung cancer Brother   . Throat cancer Brother      Current Outpatient Medications:  .   acetaminophen (TYLENOL) 500 MG tablet, Take 1,000 mg by mouth every 6 (six) hours as needed for moderate pain., Disp: , Rfl:  .  diltiazem (CARDIZEM CD) 180 MG 24 hr capsule, Take 1 capsule (180 mg total) by mouth daily., Disp: 90 capsule, Rfl: 3 .  ELIQUIS 2.5 MG TABS tablet, Hold until further notice per your outpatient doctor, Cain to your GI bleeding. (Patient taking differently: Take 2.5 mg by mouth 2 (two) times daily. Hold until further notice per your outpatient doctor, Cain to your GI bleeding.), Disp: 60 tablet, Rfl:  .  escitalopram (LEXAPRO) 10 MG tablet, Take 10 mg by mouth at bedtime. , Disp: , Rfl:  .  hydrALAZINE (APRESOLINE) 25 MG tablet, Take 1 tablet (25 mg total) by mouth 3 (three) times daily as needed (As needed for elevated blood pressure with top number 160-170)., Disp: 270 tablet, Rfl: 3 .  levothyroxine (SYNTHROID) 50 MCG tablet, Take 1 tablet (50 mcg total) by mouth daily., Disp: 30 tablet, Rfl: 0 .  omeprazole (PRILOSEC) 40 MG capsule, Take 1 capsule (40 mg total) by mouth 2 (two) times daily before a meal., Disp: 180 capsule, Rfl: 3 .  pravastatin (PRAVACHOL) 40 MG tablet, Take 40 mg by mouth every evening. , Disp: , Rfl:  .  torsemide (DEMADEX) 20 MG tablet, Take 1 tablet (20 mg total) by mouth daily as needed., Disp: 90 tablet, Rfl: 1 .  Vitamin D, Ergocalciferol, (DRISDOL) 50000 units CAPS capsule, Take 50,000 Units by mouth 2 (two) times a week. Take on Sunday and Thursday, Disp: , Rfl:   Physical exam:  Vitals:   06/30/19 1412  BP: (!) 144/67  Pulse: 61  Temp: 98.7 F (37.1 C)  TempSrc: Tympanic  Weight: 193 lb (87.5 kg)  Height: 6' (1.829 m)   Physical Exam Constitutional:      General: She is not in acute distress.    Comments: Sitting in a wheelchair.  Appears in no acute distress  HENT:     Head: Normocephalic and atraumatic.  Eyes:     Pupils: Pupils are equal, round, and reactive to light.  Cardiovascular:     Rate and Rhythm: Normal rate and  regular rhythm.     Heart sounds: Normal heart sounds.  Pulmonary:     Effort: Pulmonary effort is normal.     Breath sounds: Normal breath sounds.  Abdominal:     General: Bowel sounds are normal.     Palpations: Abdomen is soft.  Musculoskeletal:     Cervical back: Normal range of motion.  Skin:    General: Skin is warm and dry.  Neurological:     Mental Status: She is alert and oriented to person, place, and time.      CMP Latest Ref Rng & Units 05/26/2019  Glucose 70 - 99 mg/dL 127(H)  BUN 8 - 23 mg/dL 14  Creatinine 0.44 - 1.00 mg/dL 1.49(H)  Sodium 135 - 145 mmol/L 138  Potassium 3.5 - 5.1 mmol/L 4.0  Chloride 98 - 111 mmol/L 104  CO2 22 - 32 mmol/L 24  Calcium 8.9 - 10.3 mg/dL 8.6(L)  Total Protein 6.5 - 8.1 g/dL 6.4(L)  Total Bilirubin 0.3 - 1.2 mg/dL 0.7  Alkaline Phos 38 - 126 U/L 89  AST 15 - 41 U/L 15  ALT 0 - 44 U/L 11   CBC Latest Ref Rng & Units 06/30/2019  WBC 4.0 - 10.5 K/uL 5.4  Hemoglobin 12.0 - 15.0 g/dL 11.9(L)  Hematocrit 36.0 - 46.0 % 36.2  Platelets 150 - 400 K/uL 129(L)      Assessment and plan- Patient is a 86 y.o. female with adenocarcinoma of the gastric cardia/GE junction TX N1 M0 s/p palliative radiation here for routine follow-up  1.  Patient has completed palliative radiation to her gastric mass her hemoglobin is improved to 11.9.Iron studies do not indicate any iron deficiency anemia and she does not require any IV iron at this time.  If patient develops any dark melanotic stools or bright red blood in her rectum she will let us know and I will get in touch with GI to see if a repeat hemospray intervention would be warranted.  2.  Regards to treatment of gastric cancer itself patient did not desire to pursue any chemotherapy.  We discussed moving forward-we could consider one of the 3 options 1.  Pursuing palliative immunotherapy if her PD-L1 expression on her tumor is high with a CPS score of greater than 5.  I would recommend Keytruda 200  mg IV every 3 weeks until progression or toxicity.  Discussed risks and benefits of Keytruda including all but not limited to autoimmune side effect such as colitis, dermatitis thyroiditis endocrinopathies and the need to monitor kidney and liver functions.  2.  Continuing best supportive care including checking her hemoglobin and iron studies from time to time with medical intervention as necessary if she were to develop bleeding from her gastric mass  3.  No further lab work or imaging and focusing on comfort and quality of life and pursuing hospice  At this time patient would like to pursue option 2 would continue to monitor her blood work.  She has not decided about repeat scans at this time.  I will see her back in 2 months time with CBC ferritin and iron studies.  Patient continues to be on Eliquis for history of A. fib and stroke   Visit Diagnosis 1. Iron deficiency anemia, unspecified iron deficiency anemia type   2. GE junction carcinoma ([place])      Dr. Randa Evens, MD, MPH [facility] at [facility] [phone] 07/01/2019 2:24 PM

## 2019-07-04 ENCOUNTER — Ambulatory Visit
Admission: RE | Admit: 2019-07-04 | Discharge: 2019-07-04 | Disposition: A | Discharge status: Home / Self Care | Payer: Medicare Other | Source: Ambulatory Visit | Attending: Radiation Oncology | Admitting: Radiation Oncology

## 2019-07-04 DIAGNOSIS — C160 Malignant neoplasm of cardia: Secondary | ICD-10-CM | POA: Diagnosis not present

## 2019-07-05 ENCOUNTER — Ambulatory Visit
Admission: RE | Admit: 2019-07-05 | Discharge: 2019-07-05 | Disposition: A | Discharge status: Home / Self Care | Payer: Medicare Other | Source: Ambulatory Visit | Attending: Radiation Oncology | Admitting: Radiation Oncology

## 2019-07-05 DIAGNOSIS — C160 Malignant neoplasm of cardia: Secondary | ICD-10-CM | POA: Diagnosis not present

## 2019-08-17 ENCOUNTER — Ambulatory Visit
Admission: RE | Admit: 2019-08-17 | Discharge: 2019-08-17 | Disposition: A | Discharge status: Home / Self Care | Payer: Medicare Other | Source: Ambulatory Visit | Attending: Radiation Oncology | Admitting: Radiation Oncology

## 2019-08-17 ENCOUNTER — Encounter: Payer: Self-pay | Admitting: Radiation Oncology

## 2019-08-17 ENCOUNTER — Other Ambulatory Visit: Payer: Self-pay

## 2019-08-17 VITALS — BP 137/65 | HR 73 | Temp 95.9°F | Resp 16 | Wt 184.0 lb

## 2019-08-17 DIAGNOSIS — Z923 Personal history of irradiation: Secondary | ICD-10-CM | POA: Insufficient documentation

## 2019-08-17 DIAGNOSIS — C160 Malignant neoplasm of cardia: Secondary | ICD-10-CM | POA: Diagnosis present

## 2019-08-17 DIAGNOSIS — I6529 Occlusion and stenosis of unspecified carotid artery: Secondary | ICD-10-CM | POA: Insufficient documentation

## 2019-08-17 NOTE — Progress Notes (Signed)
Radiation Oncology Follow up Note  Name: Theresa Cain   Date:   08/17/2019 MRN:  [id number] DOB: 06-30-1926    This 86 y.o. female presents to the clinic today for 1 month follow-up status post palliative radiation therapy to her stomach for a adenocarcinoma gastric cardia GE junction.Marland Kitchen  REFERRING PROVIDER: Jannett Celestine*  HPI: Patient is a 86 year old female 1 month out from palliative radiation therapy to her stomach for adenocarcinoma gastric cardia GE junction.  Her hemoglobin has improved with no indication of iron deficiency anemia at this time indicative of a good response.  She specifically [place] any dysphagia she actually feels extremely well.  She is scheduled to see Dr. Janese Banks next week of possible immunotherapy.  She specifically denies any nausea or gastric reflux at this time..  COMPLICATIONS OF TREATMENT: none  FOLLOW UP COMPLIANCE: keeps appointments   PHYSICAL EXAM:  BP 137/65 (BP Location: Right Arm, Patient Position: Sitting, Cuff Size: Normal)   Pulse 73   Temp (!) 95.9 F (35.5 C) (Tympanic)   Resp 16   Wt 184 lb (83.5 kg)   BMI 24.95 kg/m  Well-developed well-nourished patient in NAD. HEENT reveals PERLA, EOMI, discs not visualized.  Oral cavity is clear. No oral mucosal lesions are identified. Neck is clear without evidence of cervical or supraclavicular adenopathy. Lungs are clear to A&P. Cardiac examination is essentially unremarkable with regular rate and rhythm without murmur rub or thrill. Abdomen is benign with no organomegaly or masses noted. Motor sensory and DTR levels are equal and symmetric in the upper and lower extremities. Cranial nerves II through XII are grossly intact. Proprioception is intact. No peripheral adenopathy or edema is identified. No motor or sensory levels are noted. Crude visual fields are within normal range.  RADIOLOGY RESULTS: No current films for review  PLAN: Present time she is achieved excellent palliation as indicated by  her stable hemoglobin and patient with adenocarcinoma the GE junction cardia.  I am pleased with her overall progress.  Of asked to see her back in 4 to 5 months for follow-up.  She has an appointment next week with Dr. Janese Banks to discuss immunotherapy which I told her I think is an extremely good idea.  Patient knows to call with any concerns.  I would like to take this opportunity to thank you for allowing me to participate in the care of your patient.Noreene Filbert, MD

## 2019-08-30 ENCOUNTER — Inpatient Hospital Stay: Payer: Medicare Other | Attending: Oncology

## 2019-08-30 ENCOUNTER — Inpatient Hospital Stay (HOSPITAL_BASED_OUTPATIENT_CLINIC_OR_DEPARTMENT_OTHER): Payer: Medicare Other | Admitting: Oncology

## 2019-08-30 ENCOUNTER — Other Ambulatory Visit: Payer: Self-pay

## 2019-08-30 ENCOUNTER — Encounter: Payer: Self-pay | Admitting: Oncology

## 2019-08-30 VITALS — BP 165/87 | HR 62 | Temp 95.9°F | Resp 18 | Wt 191.9 lb

## 2019-08-30 DIAGNOSIS — D509 Iron deficiency anemia, unspecified: Secondary | ICD-10-CM | POA: Insufficient documentation

## 2019-08-30 DIAGNOSIS — I639 Cerebral infarction, unspecified: Secondary | ICD-10-CM | POA: Diagnosis not present

## 2019-08-30 DIAGNOSIS — Z7901 Long term (current) use of anticoagulants: Secondary | ICD-10-CM | POA: Diagnosis not present

## 2019-08-30 DIAGNOSIS — C160 Malignant neoplasm of cardia: Secondary | ICD-10-CM

## 2019-08-30 DIAGNOSIS — I4821 Permanent atrial fibrillation: Secondary | ICD-10-CM | POA: Diagnosis not present

## 2019-08-30 LAB — IRON AND TIBC
Iron: 44 ug/dL (ref 28–170)
Saturation Ratios: 14 % (ref 10.4–31.8)
TIBC: 312 ug/dL (ref 250–450)
UIBC: 268 ug/dL

## 2019-08-30 LAB — CBC WITH DIFFERENTIAL/PLATELET
Abs Immature Granulocytes: 0.04 10*3/uL (ref 0.00–0.07)
Basophils Absolute: 0.1 10*3/uL (ref 0.0–0.1)
Basophils Relative: 1 %
Eosinophils Absolute: 0.2 10*3/uL (ref 0.0–0.5)
Eosinophils Relative: 3 %
HCT: 34.4 % — ABNORMAL LOW (ref 36.0–46.0)
Hemoglobin: 11.5 g/dL — ABNORMAL LOW (ref 12.0–15.0)
Immature Granulocytes: 1 %
Lymphocytes Relative: 13 %
Lymphs Abs: 0.9 10*3/uL (ref 0.7–4.0)
MCH: 28.8 pg (ref 26.0–34.0)
MCHC: 33.4 g/dL (ref 30.0–36.0)
MCV: 86 fL (ref 80.0–100.0)
Monocytes Absolute: 0.4 10*3/uL (ref 0.1–1.0)
Monocytes Relative: 7 %
Neutro Abs: 5 10*3/uL (ref 1.7–7.7)
Neutrophils Relative %: 75 %
Platelets: 151 10*3/uL (ref 150–400)
RBC: 4 MIL/uL (ref 3.87–5.11)
RDW: 15.3 % (ref 11.5–15.5)
WBC: 6.6 10*3/uL (ref 4.0–10.5)
nRBC: 0 % (ref 0.0–0.2)

## 2019-08-30 LAB — FERRITIN: Ferritin: 48 ng/mL (ref 11–307)

## 2019-08-30 NOTE — Progress Notes (Signed)
Patient here for oncology follow-up appointment, chronic knee pain per patient, otherwise expresses no other complaints or concerns at this time.

## 2019-08-31 ENCOUNTER — Ambulatory Visit (INDEPENDENT_AMBULATORY_CARE_PROVIDER_SITE_OTHER): Payer: Medicare Other | Admitting: *Deleted

## 2019-08-31 DIAGNOSIS — I495 Sick sinus syndrome: Secondary | ICD-10-CM | POA: Diagnosis not present

## 2019-09-01 ENCOUNTER — Telehealth: Payer: Self-pay

## 2019-09-01 LAB — CUP PACEART REMOTE DEVICE CHECK
Battery Remaining Longevity: 46 mo
Battery Voltage: 2.99 V
Brady Statistic AP VP Percent: 0.02 %
Brady Statistic AP VS Percent: 0 %
Brady Statistic AS VP Percent: 88.77 %
Brady Statistic AS VS Percent: 11.21 %
Brady Statistic RA Percent Paced: 0.01 %
Brady Statistic RV Percent Paced: 88.8 %
Date Time Interrogation Session: 20210610093950
Implantable Lead Implant Date: 20171212
Implantable Lead Implant Date: 20171212
Implantable Lead Location: 753859
Implantable Lead Location: 753860
Implantable Lead Model: 5076
Implantable Lead Model: 5076
Implantable Pulse Generator Implant Date: 20171212
Lead Channel Impedance Value: 304 Ohm
Lead Channel Impedance Value: 361 Ohm
Lead Channel Impedance Value: 361 Ohm
Lead Channel Impedance Value: 456 Ohm
Lead Channel Pacing Threshold Amplitude: 0.5 V
Lead Channel Pacing Threshold Pulse Width: 0.4 ms
Lead Channel Sensing Intrinsic Amplitude: 0.375 mV
Lead Channel Sensing Intrinsic Amplitude: 0.375 mV
Lead Channel Sensing Intrinsic Amplitude: 11.375 mV
Lead Channel Sensing Intrinsic Amplitude: 11.375 mV
Lead Channel Setting Pacing Amplitude: 2 V
Lead Channel Setting Pacing Amplitude: 2.5 V
Lead Channel Setting Pacing Pulse Width: 0.4 ms
Lead Channel Setting Sensing Sensitivity: 2.8 mV

## 2019-09-01 NOTE — Telephone Encounter (Signed)
Left message for patient to remind of missed remote transmission.  

## 2019-09-02 NOTE — Progress Notes (Signed)
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 Was discharged and eliquis was held and came with wrist drop- radial nerve palsy versus acute ischemic stroke per neurology. Pathology from gastric mass shows gastric adenocarcinoma. Wrist drop has resolved but hand grip remains weak  PET scan showed hypermetabolism in the gastric cardia and small LN in the gastrohepatic ligament  Patient decided to opt for palliative RT alone. No chemo or surgical consideration  Interval history-patient continues to be on Eliquis and tolerating it well.  She has not noticed any bright red blood per rectum or dark melanotic stools.  Energy levels are good.  She has been active and independent of her ADLs.  Appetite is good and weight has remained stable  ECOG PS- 1 Pain scale- 0   Review of systems- Review of Systems  Constitutional: Negative for chills, fever, malaise/fatigue and weight loss.  HENT: Negative for congestion, ear discharge and nosebleeds.   Eyes: Negative for blurred vision.  Respiratory: Negative for cough,  hemoptysis, sputum production, shortness of breath and wheezing.   Cardiovascular: Negative for chest pain, palpitations, orthopnea and claudication.  Gastrointestinal: Negative for abdominal pain, blood in stool, constipation, diarrhea, heartburn, melena, nausea and vomiting.  Genitourinary: Negative for dysuria, flank pain, frequency, hematuria and urgency.  Musculoskeletal: Negative for back pain, joint pain and myalgias.  Skin: Negative for rash.  Neurological: Negative for dizziness, tingling, focal weakness, seizures, weakness and headaches.  Endo/Heme/Allergies: Does not bruise/bleed easily.  Psychiatric/Behavioral: Negative for depression and suicidal ideas. The patient does not have insomnia.       Allergies  Allergen Reactions  . Penicillins Hives and Other (See Comments)    PATIENT HAS HAD A PCN REACTION WITH IMMEDIATE RASH, FACIAL/TONGUE/THROAT SWELLING, SOB, OR LIGHTHEADEDNESS WITH HYPOTENSION:  #  #  #  YES  #  #  #   Has patient had a PCN reaction causing severe rash involving mucus membranes or skin necrosis: No Has patient had a PCN reaction that required hospitalization: No Has patient had a PCN reaction occurring within the last 10 years: No If all of the above answers are "NO", then may proceed with Cephalosporin use.   . Sulfa Antibiotics Hives  . Cymbalta [Duloxetine Hcl]     Sweat, feels like having a heart attack      Past Medical History:  Diagnosis Date  . Acute on chronic diastolic CHF (congestive heart failure) ([place]) 02/22/2016  . Anemia   . Bradycardia   . Carotid artery occlusion   . Chronic diastolic CHF (congestive heart failure) ([place])   . Chronic kidney disease    elevated creatinne  . Coronary artery disease involving native coronary artery of native heart   .  GERD (gastroesophageal reflux disease)   . HLD (hyperlipidemia)   . Hypertension   . Hypothyroidism   . Obesity   . Osteoarthritis   . Permanent atrial fibrillation ([place])   . Presence  of permanent cardiac pacemaker   . PVD (peripheral vascular disease) ([place])   . 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 Severe aortic stenosis   . Stomach cancer ([facility]) ~ 2013  . Vertigo      Past Surgical History:  Procedure Laterality Date  . CARDIOVASCULAR STRESS TEST    . CARDIOVERSION N/A 08/08/2016   Procedure: CARDIOVERSION;  Surgeon: Sueanne Margarita, MD;  Location: [facility] ENDOSCOPY;  Service: Cardiovascular;  Laterality: N/A;  . CAROTID ENDARTERECTOMY Right ~ 2005  . CATARACT EXTRACTION W/ INTRAOCULAR LENS IMPLANT Right   . CHOLECYSTECTOMY  ~ 2013   "part of her [place] OR"  . EP IMPLANTABLE DEVICE N/A 03/04/2016   Procedure: Pacemaker Implant;  Surgeon: Will Meredith Leeds, MD;  Location: [place] CV LAB;  Service: Cardiovascular;  Laterality: N/A;  . EP IMPLANTABLE DEVICE N/A 03/05/2016   Procedure: Lead Revision/Repair;  Surgeon: Deboraha Sprang, MD;  Location: [place] CV LAB;  Service: Cardiovascular;  Laterality: N/A;  . ESOPHAGOGASTRODUODENOSCOPY (EGD) WITH PROPOFOL N/A 05/13/2019   Procedure: ESOPHAGOGASTRODUODENOSCOPY (EGD) WITH PROPOFOL;  Surgeon: Jonathon Bellows, MD;  [facility] ENDOSCOPY;  Service: Gastroenterology;  Laterality: N/A;  . INSERT / REPLACE / REMOVE PACEMAKER    . LEFT HEART CATH AND CORONARY ANGIOGRAPHY N/A 03/27/2017   Procedure: LEFT HEART CATH AND CORONARY ANGIOGRAPHY;  Surgeon: Minna Merritts, MD;  Location: [place] CV LAB;  Service: Cardiovascular;  Laterality: N/A;  . TEE WITHOUT CARDIOVERSION N/A 05/12/2017   Procedure: TRANSESOPHAGEAL ECHOCARDIOGRAM (TEE);  Surgeon: Sherren Mocha, MD;  Location: [place];  Service: Open Heart Surgery;  Laterality: N/A;  . TRANSCATHETER AORTIC VALVE REPLACEMENT, TRANSFEMORAL N/A 05/12/2017   Procedure: TRANSCATHETER AORTIC VALVE REPLACEMENT, TRANSFEMORAL using a 67mm Edwards Sapien 3  Aortic Valve;  Surgeon: Sherren Mocha, MD;  Location: [place];  Service: Open Heart Surgery;  Laterality: N/A;  . US ECHOCARDIOGRAPHY    . VAGINAL HYSTERECTOMY    . WHIPPLE PROCEDURE  ~ 2013    Social History   Socioeconomic History  . Marital status: Widowed    Spouse name: Not on file  . Number of children: Not on file  . Years of education: Not on file  . Highest education level: Not on file  Occupational History  . Not on file  Tobacco Use  . Smoking status: Never Smoker  . Smokeless tobacco: Never Used  Vaping Use  . Vaping Use: Never used  Substance and Sexual Activity  . Alcohol use: No  . Drug use: No  . Sexual activity: Never  Other Topics Concern  . Not on file  Social History Narrative  . Not on file   Social Determinants of Health   Financial Resource Strain:   . Difficulty of Paying Living Expenses:   Food Insecurity:   . Worried About Charity fundraiser in the Last Year:   . Arboriculturist in the Last Year:   Transportation Needs:   . Film/video editor (Medical):   Marland Kitchen Lack of Transportation (Non-Medical):   Physical Activity:   . Days of Exercise per Week:   . Minutes of Exercise per Session:   Stress:   .  Feeling of Stress :   Social Connections:   . Frequency of Communication with Friends and Family:   . Frequency of Social Gatherings with Friends and Family:   . Attends Religious Services:   . Active Member of Clubs or Organizations:   . Attends Archivist Meetings:   Marland Kitchen Marital Status:   Intimate Partner Violence:   . Fear of Current or Ex-Partner:   . Emotionally Abused:   Marland Kitchen Physically Abused:   . Sexually Abused:     Family History  Problem Relation Age of Onset  . Heart disease Sister   . Breast cancer Sister   . Diabetes Mellitus II Sister   . Cerebral aneurysm Mother   . Healthy Father   . Lung cancer Brother   . Throat cancer Brother      Current Outpatient Medications:  .  acetaminophen (TYLENOL) 500 MG  tablet, Take 1,000 mg by mouth every 6 (six) hours as needed for moderate pain., Disp: , Rfl:  .  diltiazem (CARDIZEM CD) 180 MG 24 hr capsule, Take 1 capsule (180 mg total) by mouth daily., Disp: 90 capsule, Rfl: 3 .  ELIQUIS 2.5 MG TABS tablet, Hold until further notice per your outpatient doctor, due to your GI bleeding. (Patient taking differently: Take 2.5 mg by mouth 2 (two) times daily. Hold until further notice per your outpatient doctor, due to your GI bleeding.), Disp: 60 tablet, Rfl:  .  escitalopram (LEXAPRO) 10 MG tablet, Take 10 mg by mouth at bedtime. , Disp: , Rfl:  .  hydrALAZINE (APRESOLINE) 25 MG tablet, Take 1 tablet (25 mg total) by mouth 3 (three) times daily as needed (As needed for elevated blood pressure with top number 160-170)., Disp: 270 tablet, Rfl: 3 .  levothyroxine (SYNTHROID) 50 MCG tablet, Take 1 tablet (50 mcg total) by mouth daily., Disp: 30 tablet, Rfl: 0 .  omeprazole (PRILOSEC) 40 MG capsule, Take 1 capsule (40 mg total) by mouth 2 (two) times daily before a meal., Disp: 180 capsule, Rfl: 3 .  pravastatin (PRAVACHOL) 40 MG tablet, Take 40 mg by mouth every evening. , Disp: , Rfl:  .  torsemide (DEMADEX) 20 MG tablet, Take 1 tablet (20 mg total) by mouth daily as needed., Disp: 90 tablet, Rfl: 1 .  Vitamin D, Ergocalciferol, (DRISDOL) 50000 units CAPS capsule, Take 50,000 Units by mouth 2 (two) times a week. Take on Sunday and Thursday, Disp: , Rfl:   Physical exam:  Vitals:   08/30/19 1135  BP: (!) 165/87  Pulse: 62  Resp: 18  Temp: (!) 95.9 F (35.5 C)  TempSrc: Tympanic  SpO2: 98%  Weight: 191 lb 14.4 oz (87 kg)   Physical Exam Constitutional:      General: She is not in acute distress.    Comments: She is sitting in a wheelchair.  Appears in no acute distress  Cardiovascular:     Rate and Rhythm: Normal rate and regular rhythm.     Heart sounds: Normal heart sounds.  Pulmonary:     Effort: Pulmonary effort is normal.     Breath sounds: Normal  breath sounds.  Abdominal:     General: Bowel sounds are normal.     Palpations: Abdomen is soft.  Musculoskeletal:     Comments: Trace bilateral edema  Skin:    General: Skin is warm and dry.  Neurological:     Mental Status: She is alert and oriented to person, place, and time.  CMP Latest Ref Rng & Units 05/26/2019  Glucose 70 - 99 mg/dL 127(H)  BUN 8 - 23 mg/dL 14  Creatinine 0.44 - 1.00 mg/dL 1.49(H)  Sodium 135 - 145 mmol/L 138  Potassium 3.5 - 5.1 mmol/L 4.0  Chloride 98 - 111 mmol/L 104  CO2 22 - 32 mmol/L 24  Calcium 8.9 - 10.3 mg/dL 8.6(L)  Total Protein 6.5 - 8.1 g/dL 6.4(L)  Total Bilirubin 0.3 - 1.2 mg/dL 0.7  Alkaline Phos 38 - 126 U/L 89  AST 15 - 41 U/L 15  ALT 0 - 44 U/L 11   CBC Latest Ref Rng & Units 08/30/2019  WBC 4.0 - 10.5 K/uL 6.6  Hemoglobin 12.0 - 15.0 g/dL 11.5(L)  Hematocrit 36 - 46 % 34.4(L)  Platelets 150 - 400 K/uL 151    No images are attached to the encounter.  CUP PACEART REMOTE DEVICE CHECK  Result Date: 09/01/2019 Scheduled remote reviewed. Normal device function.  Known permanent AF, on [place] according to previous reports Next remote in 91 days. Kathy Breach, RN, CCDS, [employer]    Assessment and plan- Patient is a 86 y.o. female with adenocarcinoma of the gastric cardia/GE junction TX N1 M0 s/p palliative radiation and resulting iron deficiency anemia here for routine follow-up  1.  Iron deficiency anemia: Hemoglobin presently remained stable around 11.  Iron studies are normal.  This indicates she is not having any active bleeding despite being on Eliquis.  We will continue to monitor her labs every 4 months and I will see her back in 4 months  2.  Gastric cardia/GE junction tumor: S/p palliative radiation treatment.  Patient did not desire any systemic treatment.  We are not getting any systemic scans to assess response.  She continues to do well from a performance status standpoint and continues to be independent of her  ADLs   Visit Diagnosis 1. GE junction carcinoma ([place])   2. Iron deficiency anemia, unspecified iron deficiency anemia type      Dr. Randa Evens, MD, MPH [facility] [phone] 09/02/2019 8:32 AM

## 2019-09-05 NOTE — Progress Notes (Signed)
Remote pacemaker transmission.   

## 2019-10-03 ENCOUNTER — Other Ambulatory Visit: Payer: Self-pay | Admitting: Cardiovascular Disease

## 2019-10-03 NOTE — Telephone Encounter (Signed)
Refill request

## 2019-10-03 NOTE — Telephone Encounter (Signed)
Pt's age 86, wt 27 kg, SCr 1.49, CrCl 34.47, last ov w/ TG 06/06/19. Pt on lower dosage of Eliquis 2/2 age and kidney fx.

## 2019-10-05 ENCOUNTER — Other Ambulatory Visit: Payer: Self-pay | Admitting: Cardiovascular Disease

## 2019-10-31 ENCOUNTER — Inpatient Hospital Stay: Payer: Medicare Other | Attending: Oncology

## 2019-10-31 ENCOUNTER — Inpatient Hospital Stay (HOSPITAL_BASED_OUTPATIENT_CLINIC_OR_DEPARTMENT_OTHER): Payer: Medicare Other | Admitting: Nurse Practitioner

## 2019-10-31 ENCOUNTER — Other Ambulatory Visit: Payer: Self-pay | Admitting: *Deleted

## 2019-10-31 ENCOUNTER — Other Ambulatory Visit: Payer: Self-pay

## 2019-10-31 ENCOUNTER — Telehealth: Payer: Self-pay | Admitting: *Deleted

## 2019-10-31 ENCOUNTER — Encounter: Payer: Self-pay | Admitting: Nurse Practitioner

## 2019-10-31 VITALS — BP 158/71 | HR 61 | Temp 98.2°F | Resp 18 | Wt 188.0 lb

## 2019-10-31 DIAGNOSIS — K921 Melena: Secondary | ICD-10-CM | POA: Diagnosis present

## 2019-10-31 DIAGNOSIS — Z7901 Long term (current) use of anticoagulants: Secondary | ICD-10-CM

## 2019-10-31 DIAGNOSIS — Z85028 Personal history of other malignant neoplasm of stomach: Secondary | ICD-10-CM | POA: Insufficient documentation

## 2019-10-31 DIAGNOSIS — C160 Malignant neoplasm of cardia: Secondary | ICD-10-CM | POA: Diagnosis not present

## 2019-10-31 DIAGNOSIS — D509 Iron deficiency anemia, unspecified: Secondary | ICD-10-CM

## 2019-10-31 DIAGNOSIS — D500 Iron deficiency anemia secondary to blood loss (chronic): Secondary | ICD-10-CM | POA: Diagnosis not present

## 2019-10-31 DIAGNOSIS — R531 Weakness: Secondary | ICD-10-CM

## 2019-10-31 DIAGNOSIS — I4821 Permanent atrial fibrillation: Secondary | ICD-10-CM | POA: Diagnosis not present

## 2019-10-31 LAB — COMPREHENSIVE METABOLIC PANEL
ALT: 16 U/L (ref 0–44)
AST: 31 U/L (ref 15–41)
Albumin: 3.5 g/dL (ref 3.5–5.0)
Alkaline Phosphatase: 130 U/L — ABNORMAL HIGH (ref 38–126)
Anion gap: 10 (ref 5–15)
BUN: 16 mg/dL (ref 8–23)
CO2: 28 mmol/L (ref 22–32)
Calcium: 8.5 mg/dL — ABNORMAL LOW (ref 8.9–10.3)
Chloride: 99 mmol/L (ref 98–111)
Creatinine, Ser: 1.54 mg/dL — ABNORMAL HIGH (ref 0.44–1.00)
GFR calc Af Amer: 34 mL/min — ABNORMAL LOW (ref >60)
GFR calc non Af Amer: 29 mL/min — ABNORMAL LOW (ref >60)
Glucose, Bld: 115 mg/dL — ABNORMAL HIGH (ref 70–99)
Potassium: 4.6 mmol/L (ref 3.5–5.1)
Sodium: 137 mmol/L (ref 135–145)
Total Bilirubin: 0.6 mg/dL (ref 0.3–1.2)
Total Protein: 6.8 g/dL (ref 6.5–8.1)

## 2019-10-31 LAB — CBC WITH DIFFERENTIAL/PLATELET
Abs Immature Granulocytes: 0.02 10*3/uL (ref 0.00–0.07)
Basophils Absolute: 0 10*3/uL (ref 0.0–0.1)
Basophils Relative: 1 %
Eosinophils Absolute: 0.2 10*3/uL (ref 0.0–0.5)
Eosinophils Relative: 4 %
HCT: 27.9 % — ABNORMAL LOW (ref 36.0–46.0)
Hemoglobin: 9 g/dL — ABNORMAL LOW (ref 12.0–15.0)
Immature Granulocytes: 0 %
Lymphocytes Relative: 11 %
Lymphs Abs: 0.7 10*3/uL (ref 0.7–4.0)
MCH: 28.6 pg (ref 26.0–34.0)
MCHC: 32.3 g/dL (ref 30.0–36.0)
MCV: 88.6 fL (ref 80.0–100.0)
Monocytes Absolute: 0.5 10*3/uL (ref 0.1–1.0)
Monocytes Relative: 7 %
Neutro Abs: 5.2 10*3/uL (ref 1.7–7.7)
Neutrophils Relative %: 77 %
Platelets: 157 10*3/uL (ref 150–400)
RBC: 3.15 MIL/uL — ABNORMAL LOW (ref 3.87–5.11)
RDW: 15 % (ref 11.5–15.5)
WBC: 6.7 10*3/uL (ref 4.0–10.5)
nRBC: 0 % (ref 0.0–0.2)

## 2019-10-31 LAB — IRON AND TIBC
Iron: 24 ug/dL — ABNORMAL LOW (ref 28–170)
Saturation Ratios: 7 % — ABNORMAL LOW (ref 10.4–31.8)
TIBC: 335 ug/dL (ref 250–450)
UIBC: 311 ug/dL

## 2019-10-31 LAB — SAMPLE TO BLOOD BANK

## 2019-10-31 LAB — FERRITIN: Ferritin: 32 ng/mL (ref 11–307)

## 2019-10-31 NOTE — Telephone Encounter (Signed)
Daughter Theresa Cain called reporting and requesting that appointment needs to be moved up as patient is very weak, pale and that she has been passing dark red blood in her stools for 4 days. Please advise

## 2019-10-31 NOTE — Telephone Encounter (Signed)
Lab (cbc, cmp, hold tube) at 2:45, see [place] at 3:15.

## 2019-10-31 NOTE — Progress Notes (Signed)
Pt has noticed that she has had dark stools for 2 weeks. She has IDA. She is tired all the time. She does not see any bright red blood in her stool. She usually has 2 BM's a day. Yest. She was constipated but that is not the normal. She eats and drinks good.

## 2019-10-31 NOTE — Progress Notes (Signed)
Symptom Management [place]  [phone] [phone]  Patient Care Team: Kendrick Ranch, MD as PCP - General (Internal Medicine) Constance Haw, MD as PCP - Electrophysiology (Cardiology) Sherren Mocha, MD as PCP - Cardiology (Cardiology) Sindy Guadeloupe, MD as Consulting Physician (Hematology and Oncology) Clent Jacks, RN as Oncology Nurse Navigator   Name of the patient: Theresa Cain  [id number]  10/10/1926   Date of visit: 10/31/19  Diagnosis-gastric adenocarcinoma of the GE junction  Chief complaint/ Reason for visit-melena  Heme/Onc history:  Patient presented as 86 year old female with prior history of neuroendocrine tumor in 2014.  She presented with acute GI bleed status post EGD which showed mass in the gastric cardia just below the GE junction and acute upper GI bleed status post hemospray with Dr. Vicente Males on 05/13/2019.  She was discharged and Eliquis was held.  She had wrist drop due to radial nerve palsy versus acute ischemic stroke per neurology.  Pathology from gastric mass showed gastric adenocarcinoma.  Wrist drop resolved but handgrip remained weak.  PET scan showed hypermetabolic some in the gastric cardia and small lymph node in the gastrohepatic ligament.  She opted for palliative radiation alone and declined chemotherapy or surgery.  She completed radiation 07/05/2019.  Oncology History   No history exists.    Interval history-patient reports to symptom management clinic for complaints of black stools.  Symptoms started 2 weeks ago.  She denies abdominal pain, nausea or vomiting, alternating constipation and diarrhea, or bright red blood.  She has a history of dark stools and history of iron deficiency anemia.  She is on Eliquis.  Had similar episodes to this in the past though she felt this was related to eating tomatoes however symptoms persisted.  No dysphagia.  Iron studies were normal in  June.  ECOG FS:1 - Symptomatic but completely ambulatory  Review of systems- Review of Systems  Constitutional: Negative for chills, fever, malaise/fatigue and weight loss.  HENT: Negative for hearing loss, nosebleeds, sore throat and tinnitus.   Eyes: Negative for blurred vision and double vision.  Respiratory: Negative for cough, hemoptysis, shortness of breath and wheezing.   Cardiovascular: Negative for chest pain, palpitations and leg swelling.  Gastrointestinal: Positive for melena. Negative for abdominal pain, blood in stool, constipation, diarrhea, nausea and vomiting.  Genitourinary: Negative for dysuria and urgency.  Musculoskeletal: Negative for back pain, falls, joint pain and myalgias.  Skin: Negative for itching and rash.  Neurological: Negative for dizziness, tingling, sensory change, loss of consciousness, weakness and headaches.  Endo/Heme/Allergies: Negative for environmental allergies. Does not bruise/bleed easily.  Psychiatric/Behavioral: Negative for depression. The patient is not nervous/anxious and does not have insomnia.      Current treatment-surveillance  Allergies  Allergen Reactions  . Penicillins Hives and Other (See Comments)    PATIENT HAS HAD A PCN REACTION WITH IMMEDIATE RASH, FACIAL/TONGUE/THROAT SWELLING, SOB, OR LIGHTHEADEDNESS WITH HYPOTENSION:  #  #  #  YES  #  #  #   Has patient had a PCN reaction causing severe rash involving mucus membranes or skin necrosis: No Has patient had a PCN reaction that required hospitalization: No Has patient had a PCN reaction occurring within the last 10 years: No If all of the above answers are "NO", then may proceed with Cephalosporin use.   . Sulfa Antibiotics Hives  . Cymbalta [Duloxetine Hcl]     Sweat, feels like having a heart attack     Past  Medical History:  Diagnosis Date  . Acute on chronic diastolic CHF (congestive heart failure) ([place]) 02/22/2016  . Anemia   . Bradycardia   . Carotid artery  occlusion   . Chronic diastolic CHF (congestive heart failure) ([place])   . Chronic kidney disease    elevated creatinne  . Coronary artery disease involving native coronary artery of native heart   . GERD (gastroesophageal reflux disease)   . HLD (hyperlipidemia)   . Hypertension   . Hypothyroidism   . Obesity   . Osteoarthritis   . Permanent atrial fibrillation ([place])   . Presence of permanent cardiac pacemaker   . PVD (peripheral vascular disease) ([place])   . 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 Severe aortic stenosis   . Stomach cancer ([place]) ~ 2013  . Vertigo     Past Surgical History:  Procedure Laterality Date  . CARDIOVASCULAR STRESS TEST    . CARDIOVERSION N/A 08/08/2016   Procedure: CARDIOVERSION;  Surgeon: Sueanne Margarita, MD;  Location: [facility] ENDOSCOPY;  Service: Cardiovascular;  Laterality: N/A;  . CAROTID ENDARTERECTOMY Right ~ 2005  . CATARACT EXTRACTION W/ INTRAOCULAR LENS IMPLANT Right   . CHOLECYSTECTOMY  ~ 2013   "part of her [facility] OR"  . EP IMPLANTABLE DEVICE N/A 03/04/2016   Procedure: Pacemaker Implant;  Surgeon: Will Meredith Leeds, MD;  Location: [facility] CV LAB;  Service: Cardiovascular;  Laterality: N/A;  . EP IMPLANTABLE DEVICE N/A 03/05/2016   Procedure: Lead Revision/Repair;  Surgeon: Deboraha Sprang, MD;  Location: [facility] CV LAB;  Service: Cardiovascular;  Laterality: N/A;  . ESOPHAGOGASTRODUODENOSCOPY (EGD) WITH PROPOFOL N/A 05/13/2019   Procedure: ESOPHAGOGASTRODUODENOSCOPY (EGD) WITH PROPOFOL;  Surgeon: Jonathon Bellows, MD;  Location: [facility] ENDOSCOPY;  Service: Gastroenterology;  Laterality: N/A;  . INSERT / REPLACE / REMOVE PACEMAKER    . LEFT HEART CATH AND CORONARY ANGIOGRAPHY N/A 03/27/2017   Procedure: LEFT HEART CATH AND CORONARY ANGIOGRAPHY;  Surgeon: Minna Merritts, MD;  Location: [facility] CV LAB;  Service:  Cardiovascular;  Laterality: N/A;  . TEE WITHOUT CARDIOVERSION N/A 05/12/2017   Procedure: TRANSESOPHAGEAL ECHOCARDIOGRAM (TEE);  Surgeon: Sherren Mocha, MD;  Location: [facility];  Service: Open Heart Surgery;  Laterality: N/A;  . TRANSCATHETER AORTIC VALVE REPLACEMENT, TRANSFEMORAL N/A 05/12/2017   Procedure: TRANSCATHETER AORTIC VALVE REPLACEMENT, TRANSFEMORAL using a 18mm Edwards Sapien 3 Aortic Valve;  Surgeon: Sherren Mocha, MD;  Location: [place] [facility] [facility];  Service: Open Heart Surgery;  Laterality: N/A;  . US ECHOCARDIOGRAPHY    . VAGINAL HYSTERECTOMY    . WHIPPLE PROCEDURE  ~ 2013    Social History   Socioeconomic History  . Marital status: Widowed    Spouse name: Not on file  . Number of children: Not on file  . Years of education: Not on file  . Highest education level: Not on file  Occupational History  . Not on file  Tobacco Use  . Smoking status: Never Smoker  . Smokeless tobacco: Never Used  Vaping Use  . Vaping Use: Never used  Substance and Sexual Activity  . Alcohol use: No  . Drug use: No  . Sexual activity: Never  Other Topics Concern  . Not on file  Social History Narrative  . Not on file   Social Determinants of Health   Financial Resource Strain:   . Difficulty of Paying Living Expenses:   Food Insecurity:   .  Worried About Charity fundraiser in the Last Year:   . Arboriculturist in the Last Year:   Transportation Needs:   . Film/video editor (Medical):   Marland Kitchen Lack of Transportation (Non-Medical):   Physical Activity:   . Days of Exercise per Week:   . Minutes of Exercise per Session:   Stress:   . Feeling of Stress :   Social Connections:   . Frequency of Communication with Friends and Family:   . Frequency of Social Gatherings with Friends and Family:   . Attends Religious Services:   . Active Member of Clubs or Organizations:   . Attends Archivist Meetings:   Marland Kitchen Marital Status:   Intimate Partner Violence:   . Fear of Current or  Ex-Partner:   . Emotionally Abused:   Marland Kitchen Physically Abused:   . Sexually Abused:     Family History  Problem Relation Age of Onset  . Heart disease Sister   . Breast cancer Sister   . Diabetes Mellitus II Sister   . Cerebral aneurysm Mother   . Healthy Father   . Lung cancer Brother   . Throat cancer Brother   . Throat cancer Child      Current Outpatient Medications:  .  acetaminophen (TYLENOL) 500 MG tablet, Take 1,000 mg by mouth every 6 (six) hours as needed for moderate pain., Disp: , Rfl:  .  apixaban (ELIQUIS) 2.5 MG TABS tablet, TAKE 1 TABLET BY MOUTH TWICE DAILY, Disp: 180 tablet, Rfl: 1 .  diltiazem (CARDIZEM CD) 180 MG 24 hr capsule, Take 1 capsule (180 mg total) by mouth daily., Disp: 90 capsule, Rfl: 3 .  escitalopram (LEXAPRO) 10 MG tablet, Take 10 mg by mouth at bedtime. , Disp: , Rfl:  .  hydrALAZINE (APRESOLINE) 25 MG tablet, Take 1 tablet (25 mg total) by mouth 3 (three) times daily as needed (As needed for elevated blood pressure with top number 160-170)., Disp: 270 tablet, Rfl: 3 .  levothyroxine (SYNTHROID) 50 MCG tablet, Take 1 tablet (50 mcg total) by mouth daily., Disp: 30 tablet, Rfl: 0 .  omeprazole (PRILOSEC) 40 MG capsule, Take 1 capsule (40 mg total) by mouth 2 (two) times daily before a meal., Disp: 180 capsule, Rfl: 3 .  pravastatin (PRAVACHOL) 40 MG tablet, Take 40 mg by mouth every evening. , Disp: , Rfl:  .  torsemide (DEMADEX) 20 MG tablet, TAKE (1) TABLET BY MOUTH ONCE DAILY AS NEEDED., Disp: 90 tablet, Rfl: 0 .  Vitamin D, Ergocalciferol, (DRISDOL) 50000 units CAPS capsule, Take 50,000 Units by mouth 2 (two) times a week. Take on Sunday and Thursday, Disp: , Rfl:   Physical exam:  Vitals:   10/31/19 1518  BP: (!) 158/71  Pulse: 61  Resp: 18  Temp: 98.2 F (36.8 C)  TempSrc: Oral  Weight: 188 lb (85.3 kg)   Physical Exam Constitutional:      Appearance: She is well-developed.  HENT:     Head: Atraumatic.     Nose: Nose normal.      Mouth/Throat:     Pharynx: No oropharyngeal exudate.  Cardiovascular:     Rate and Rhythm: Normal rate and regular rhythm.  Pulmonary:     Effort: Pulmonary effort is normal.     Breath sounds: Normal breath sounds.  Abdominal:     General: Bowel sounds are normal. There is no distension.     Palpations: Abdomen is soft.     Tenderness: There  is no abdominal tenderness.  Musculoskeletal:        General: Normal range of motion.     Cervical back: Normal range of motion and neck supple.  Skin:    General: Skin is warm and dry.  Neurological:     Mental Status: She is alert and oriented to person, place, and time.  Psychiatric:        Mood and Affect: Mood normal.        Behavior: Behavior normal.      CMP Latest Ref Rng & Units 10/31/2019  Glucose 70 - 99 mg/dL 115(H)  BUN 8 - 23 mg/dL 16  Creatinine 0.44 - 1.00 mg/dL 1.54(H)  Sodium 135 - 145 mmol/L 137  Potassium 3.5 - 5.1 mmol/L 4.6  Chloride 98 - 111 mmol/L 99  CO2 22 - 32 mmol/L 28  Calcium 8.9 - 10.3 mg/dL 8.5(L)  Total Protein 6.5 - 8.1 g/dL 6.8  Total Bilirubin 0.3 - 1.2 mg/dL 0.6  Alkaline Phos 38 - 126 U/L 130(H)  AST 15 - 41 U/L 31  ALT 0 - 44 U/L 16   CBC Latest Ref Rng & Units 10/31/2019  WBC 4.0 - 10.5 K/uL 6.7  Hemoglobin 12.0 - 15.0 g/dL 9.0(L)  Hematocrit 36 - 46 % 27.9(L)  Platelets 150 - 400 K/uL 157    No images are attached to the encounter.  No results found.  Assessment and plan- Patient is a 86 y.o. female diagnosed with adenocarcinoma of the gastric cardia/GE junction status post palliative radiation and resulting iron deficiency anemia who presents to symptom management clinic for complaints of melena.  1.  Melena-this was her presenting symptom which resulted in diagnosis of GE junction tumor.  Recurrent symptoms concerning for acute bleed versus recurrent tumor.  Discussed with Dr. Marolyn Haller who recommends EGD.  Scheduled for EGD with Dr. Vicente Males on 11/07/2019. Gi to fax pre-op form to cardiology  for clearance.   2.  Anticoagulation-patient on Eliquis for a fib & stroke.  I reached out to Dr. Rockey Situ for recommendations regarding Eliquis with planned EGD.  They recommended holding Eliquis 2 days prior.  3.  Anemia-hemoglobin 9.0.  Worse.  2 months ago was 11.5.  Saturation ratio 7, TIBC 335, ferritin 32 (previously 48).  Platelets 157,000.  Likely related to melena and bleed. No indication for transfusion currently. Will check labs later this week if symptoms persist to evaluate for possible transfusion. Given iron deficiency, will plan for iron next week.  4.  Adenocarcinoma of the GE junction-status post palliative radiation.  She declined any systemic treatment including Keytruda.  Not getting any scans to assess response.  She has done well from a performance status standpoint and has been asymptomatic up until now.  Discussed with Dr. Janese Banks who recommends follow-up approximately 6 weeks after EGD.  We can continue to follow her with supportive and palliative care if recurrent disease. Discussed that home and clinic based palliative care services are available.  Recommendation for EGD and anticoagulation discussed with patient's daughter who agrees with plan.    Visit Diagnosis 1. Melena   2. Long term current use of anticoagulant therapy   3. Iron deficiency anemia due to chronic blood loss   4. GE junction carcinoma ([place])     Patient expressed understanding and was in agreement with this plan. She also understands that She can call clinic at any time with any questions, concerns, or complaints.   Thank you for allowing me to participate in the  care of this very pleasant patient.   Beckey Rutter, DNP, AGNP-C [facility]

## 2019-10-31 NOTE — Telephone Encounter (Signed)
I called Theresa Cain and she is ok with 2:45 lab and see lauren after that.

## 2019-11-01 ENCOUNTER — Other Ambulatory Visit: Payer: Self-pay

## 2019-11-01 ENCOUNTER — Telehealth: Payer: Self-pay

## 2019-11-01 DIAGNOSIS — K921 Melena: Secondary | ICD-10-CM

## 2019-11-01 DIAGNOSIS — D509 Iron deficiency anemia, unspecified: Secondary | ICD-10-CM

## 2019-11-01 NOTE — Telephone Encounter (Signed)
   [facility] Pre-operative Risk Assessment    HEARTCARE STAFF: - Please ensure there is not already an duplicate clearance open for this procedure. - Under Visit Info/Reason for Call, type in Other and utilize the format Clearance MM/DD/YY or Clearance TBD. Do not use dashes or single digits. - If request is for dental extraction, please clarify the # of teeth to be extracted.  Request for surgical clearance:  1. What type of surgery is being performed? Upper Endoscopy  2. When is this surgery scheduled? 11/07/19   3. What type of clearance is required (medical clearance vs. Pharmacy clearance to hold med vs. Both)? Both  4. Are there any medications that need to be held prior to surgery and how long? Eliquis 2.5 mg, 2 day hold   5. Practice name and name of physician performing surgery? [facility],  Dr. Jonathon Bellows   6. What is the office phone number? [phone]   7.   What is the office fax number? [phone]  8.   Anesthesia type (None, local, MAC, general) ? General   Theresa Cain 11/01/2019, 2:58 PM  _________________________________________________________________   (provider comments below)

## 2019-11-01 NOTE — Telephone Encounter (Signed)
Patient with diagnosis of afib on Eliquis 2.5mg  BID for anticoagulation.    Procedure: endoscopy Date of procedure: 11/07/19  CHADS2-VASc score of 8 (age x2, sex, CHF, HTN, CAD, ? CVA 04/2019)  CrCl 61mL/min Platelet count 157K  Request is to hold Eliquis for 2 days. Will route to MD for input given elevated CV risk including history of questionable CVA earlier this year.

## 2019-11-01 NOTE — Telephone Encounter (Signed)
Pharmacy please comment on holding Eliquis as requested and then I will contact the patient.  Kerin Ransom PA-C 11/01/2019 4:09 PM

## 2019-11-01 NOTE — Progress Notes (Signed)
Called the pt today and spoke to her daughter Theresa Cain and she is agreeable to the infusion. The pt. Has gi appt on Monday of next week so any day but that. She will need 2 doses of feraheme and I will send message to heather to get the appts. Made. Theresa Cain agreeable to  plan and we can call her at cell phone listed in chart

## 2019-11-02 ENCOUNTER — Other Ambulatory Visit: Payer: Self-pay | Admitting: Oncology

## 2019-11-02 NOTE — Telephone Encounter (Signed)
   Primary Cardiologist: Sherren Mocha, MD  Chart reviewed and patient contacted by phone today as part of pre-operative protocol coverage. Given past medical history and time since last visit, based on ACC/AHA guidelines, Theresa Cain would be at acceptable risk for the planned procedure without further cardiovascular testing.   OK to hold Eliquis 2 days pre op and resume as soon as safe post op.   I will route this recommendation to the requesting party via Epic fax function and remove from pre-op pool.  Please call with questions.  Kerin Ransom, PA-C 11/02/2019, 8:48 AM

## 2019-11-02 NOTE — Telephone Encounter (Signed)
Okay to hold Eliquis 2 days

## 2019-11-03 ENCOUNTER — Other Ambulatory Visit: Payer: Self-pay

## 2019-11-03 ENCOUNTER — Other Ambulatory Visit
Admission: RE | Admit: 2019-11-03 | Discharge: 2019-11-03 | Disposition: A | Discharge status: Home / Self Care | Payer: Medicare Other | Source: Ambulatory Visit | Attending: Gastroenterology | Admitting: Gastroenterology

## 2019-11-03 ENCOUNTER — Inpatient Hospital Stay: Payer: Medicare Other

## 2019-11-03 DIAGNOSIS — Z01812 Encounter for preprocedural laboratory examination: Secondary | ICD-10-CM | POA: Diagnosis present

## 2019-11-03 DIAGNOSIS — K921 Melena: Secondary | ICD-10-CM

## 2019-11-03 DIAGNOSIS — Z20822 Contact with and (suspected) exposure to covid-19: Secondary | ICD-10-CM | POA: Insufficient documentation

## 2019-11-03 LAB — CBC WITH DIFFERENTIAL/PLATELET
Abs Immature Granulocytes: 0.01 10*3/uL (ref 0.00–0.07)
Basophils Absolute: 0 10*3/uL (ref 0.0–0.1)
Basophils Relative: 1 %
Eosinophils Absolute: 0.2 10*3/uL (ref 0.0–0.5)
Eosinophils Relative: 4 %
HCT: 26.8 % — ABNORMAL LOW (ref 36.0–46.0)
Hemoglobin: 8.7 g/dL — ABNORMAL LOW (ref 12.0–15.0)
Immature Granulocytes: 0 %
Lymphocytes Relative: 12 %
Lymphs Abs: 0.7 10*3/uL (ref 0.7–4.0)
MCH: 28.6 pg (ref 26.0–34.0)
MCHC: 32.5 g/dL (ref 30.0–36.0)
MCV: 88.2 fL (ref 80.0–100.0)
Monocytes Absolute: 0.4 10*3/uL (ref 0.1–1.0)
Monocytes Relative: 6 %
Neutro Abs: 4.5 10*3/uL (ref 1.7–7.7)
Neutrophils Relative %: 77 %
Platelets: 159 10*3/uL (ref 150–400)
RBC: 3.04 MIL/uL — ABNORMAL LOW (ref 3.87–5.11)
RDW: 15 % (ref 11.5–15.5)
WBC: 5.8 10*3/uL (ref 4.0–10.5)
nRBC: 0 % (ref 0.0–0.2)

## 2019-11-03 LAB — SAMPLE TO BLOOD BANK

## 2019-11-04 ENCOUNTER — Encounter: Payer: Self-pay | Admitting: Gastroenterology

## 2019-11-04 LAB — SARS CORONAVIRUS 2 (TAT 6-24 HRS): SARS Coronavirus 2: NEGATIVE

## 2019-11-07 ENCOUNTER — Encounter
Admission: RE | Disposition: A | Discharge status: Home / Self Care | Payer: Self-pay | Source: Home / Self Care | Attending: Gastroenterology

## 2019-11-07 ENCOUNTER — Encounter: Payer: Self-pay | Admitting: Gastroenterology

## 2019-11-07 ENCOUNTER — Ambulatory Visit: Payer: Medicare Other | Admitting: Anesthesiology

## 2019-11-07 ENCOUNTER — Other Ambulatory Visit: Payer: Self-pay

## 2019-11-07 ENCOUNTER — Ambulatory Visit
Admission: RE | Admit: 2019-11-07 | Discharge: 2019-11-07 | Disposition: A | Discharge status: Home / Self Care | Payer: Medicare Other | Attending: Gastroenterology | Admitting: Gastroenterology

## 2019-11-07 DIAGNOSIS — K219 Gastro-esophageal reflux disease without esophagitis: Secondary | ICD-10-CM | POA: Diagnosis not present

## 2019-11-07 DIAGNOSIS — Z950 Presence of cardiac pacemaker: Secondary | ICD-10-CM | POA: Insufficient documentation

## 2019-11-07 DIAGNOSIS — Z952 Presence of prosthetic heart valve: Secondary | ICD-10-CM | POA: Insufficient documentation

## 2019-11-07 DIAGNOSIS — C160 Malignant neoplasm of cardia: Secondary | ICD-10-CM | POA: Insufficient documentation

## 2019-11-07 DIAGNOSIS — Z79890 Hormone replacement therapy (postmenopausal): Secondary | ICD-10-CM | POA: Insufficient documentation

## 2019-11-07 DIAGNOSIS — I252 Old myocardial infarction: Secondary | ICD-10-CM | POA: Insufficient documentation

## 2019-11-07 DIAGNOSIS — I4821 Permanent atrial fibrillation: Secondary | ICD-10-CM | POA: Insufficient documentation

## 2019-11-07 DIAGNOSIS — N189 Chronic kidney disease, unspecified: Secondary | ICD-10-CM | POA: Insufficient documentation

## 2019-11-07 DIAGNOSIS — I130 Hypertensive heart and chronic kidney disease with heart failure and stage 1 through stage 4 chronic kidney disease, or unspecified chronic kidney disease: Secondary | ICD-10-CM | POA: Diagnosis not present

## 2019-11-07 DIAGNOSIS — Z85028 Personal history of other malignant neoplasm of stomach: Secondary | ICD-10-CM | POA: Insufficient documentation

## 2019-11-07 DIAGNOSIS — K921 Melena: Secondary | ICD-10-CM | POA: Insufficient documentation

## 2019-11-07 DIAGNOSIS — I739 Peripheral vascular disease, unspecified: Secondary | ICD-10-CM | POA: Diagnosis not present

## 2019-11-07 DIAGNOSIS — Z7901 Long term (current) use of anticoagulants: Secondary | ICD-10-CM | POA: Insufficient documentation

## 2019-11-07 DIAGNOSIS — E039 Hypothyroidism, unspecified: Secondary | ICD-10-CM | POA: Diagnosis not present

## 2019-11-07 DIAGNOSIS — I5032 Chronic diastolic (congestive) heart failure: Secondary | ICD-10-CM | POA: Diagnosis not present

## 2019-11-07 DIAGNOSIS — I6529 Occlusion and stenosis of unspecified carotid artery: Secondary | ICD-10-CM | POA: Diagnosis not present

## 2019-11-07 DIAGNOSIS — Z79899 Other long term (current) drug therapy: Secondary | ICD-10-CM | POA: Insufficient documentation

## 2019-11-07 DIAGNOSIS — E669 Obesity, unspecified: Secondary | ICD-10-CM | POA: Insufficient documentation

## 2019-11-07 DIAGNOSIS — E785 Hyperlipidemia, unspecified: Secondary | ICD-10-CM | POA: Insufficient documentation

## 2019-11-07 DIAGNOSIS — I2510 Atherosclerotic heart disease of native coronary artery without angina pectoris: Secondary | ICD-10-CM | POA: Insufficient documentation

## 2019-11-07 DIAGNOSIS — D509 Iron deficiency anemia, unspecified: Secondary | ICD-10-CM

## 2019-11-07 HISTORY — PX: ESOPHAGOGASTRODUODENOSCOPY (EGD) WITH PROPOFOL: SHX5813

## 2019-11-07 SURGERY — ESOPHAGOGASTRODUODENOSCOPY (EGD) WITH PROPOFOL
Anesthesia: General

## 2019-11-07 MED ORDER — PROPOFOL 10 MG/ML IV BOLUS
INTRAVENOUS | Status: AC
  Filled 2019-11-07: qty 40

## 2019-11-07 MED ORDER — CEFAZOLIN SODIUM-DEXTROSE 2-4 GM/100ML-% IV SOLN
INTRAVENOUS | Status: AC
  Filled 2019-11-07: qty 100

## 2019-11-07 MED ORDER — PROPOFOL 10 MG/ML IV BOLUS
INTRAVENOUS | Status: DC | PRN
  Administered 2019-11-07 (×2): 50 mg via INTRAVENOUS

## 2019-11-07 MED ORDER — SODIUM CHLORIDE 0.9 % IV SOLN: INTRAVENOUS | Status: DC

## 2019-11-07 NOTE — H&P (Signed)
Jonathon Bellows, MD [address], [place], [place], [place], [address] [address], [place], [place], [place], [address] Phone: [phone]  Fax: [phone]  Primary Care Physician:  Kendrick Ranch, MD   Pre-Procedure History & Physical: HPI:  Theresa Cain is a 86 y.o. female is here for an endoscopy    Past Medical History:  Diagnosis Date  . Acute on chronic diastolic CHF (congestive heart failure) ([place]) 02/22/2016  . Anemia   . Bradycardia   . Carotid artery occlusion   . Chronic diastolic CHF (congestive heart failure) ([place])   . Chronic kidney disease    elevated creatinne  . Coronary artery disease involving native coronary artery of native heart   . GERD (gastroesophageal reflux disease)   . HLD (hyperlipidemia)   . Hypertension   . Hypothyroidism   . Obesity   . Osteoarthritis   . Permanent atrial fibrillation ([place])   . Presence of permanent cardiac pacemaker   . PVD (peripheral vascular disease) ([place])   . 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 Severe aortic stenosis   . Stomach cancer ([place]) ~ 2013  . Vertigo     Past Surgical History:  Procedure Laterality Date  . CARDIOVASCULAR STRESS TEST    . CARDIOVERSION N/A 08/08/2016   Procedure: CARDIOVERSION;  Surgeon: Sueanne Margarita, MD;  Location: [facility] ENDOSCOPY;  Service: Cardiovascular;  Laterality: N/A;  . CAROTID ENDARTERECTOMY Right ~ 2005  . CATARACT EXTRACTION W/ INTRAOCULAR LENS IMPLANT Right   . CHOLECYSTECTOMY  ~ 2013   "part of her [facility] OR"  . CORONARY ANGIOPLASTY    . EP IMPLANTABLE DEVICE N/A 03/04/2016   Procedure: Pacemaker Implant;  Surgeon: Will Meredith Leeds, MD;  Location: [facility] CV LAB;  Service: Cardiovascular;  Laterality: N/A;  . EP IMPLANTABLE DEVICE N/A 03/05/2016   Procedure: Lead Revision/Repair;  Surgeon: Deboraha Sprang, MD;   Location: [facility] CV LAB;  Service: Cardiovascular;  Laterality: N/A;  . ESOPHAGOGASTRODUODENOSCOPY (EGD) WITH PROPOFOL N/A 05/13/2019   Procedure: ESOPHAGOGASTRODUODENOSCOPY (EGD) WITH PROPOFOL;  Surgeon: Jonathon Bellows, MD;  Location: [facility] ENDOSCOPY;  Service: Gastroenterology;  Laterality: N/A;  . EYE SURGERY    . INSERT / REPLACE / REMOVE PACEMAKER    . LEFT HEART CATH AND CORONARY ANGIOGRAPHY N/A 03/27/2017   Procedure: LEFT HEART CATH AND CORONARY ANGIOGRAPHY;  Surgeon: Minna Merritts, MD;  Location: [place] CV LAB;  Service: Cardiovascular;  Laterality: N/A;  . TEE WITHOUT CARDIOVERSION N/A 05/12/2017   Procedure: TRANSESOPHAGEAL ECHOCARDIOGRAM (TEE);  Surgeon: Sherren Mocha, MD;  Location: [facility];  Service: Open Heart Surgery;  Laterality: N/A;  . TRANSCATHETER AORTIC VALVE REPLACEMENT, TRANSFEMORAL N/A 05/12/2017   Procedure: TRANSCATHETER AORTIC VALVE REPLACEMENT, TRANSFEMORAL using a 14mm Edwards Sapien 3 Aortic Valve;  Surgeon: Sherren Mocha, MD;  Location: [facility];  Service: Open Heart Surgery;  Laterality: N/A;  . US ECHOCARDIOGRAPHY    . VAGINAL HYSTERECTOMY    . WHIPPLE PROCEDURE  ~ 2013    Prior to Admission medications   Medication Sig Start Date End Date Taking? Authorizing Provider  acetaminophen (TYLENOL) 500 MG tablet Take 1,000 mg by mouth every 6 (six) hours as needed for moderate pain.   Yes [provider]  apixaban (ELIQUIS) 2.5 MG TABS tablet TAKE 1 TABLET BY MOUTH TWICE DAILY 10/03/19  Yes Gollan, Kathlene November, MD  diltiazem (  CARDIZEM CD) 180 MG 24 hr capsule Take 1 capsule (180 mg total) by mouth daily. 03/27/17  Yes Vaughan Basta, MD  escitalopram (LEXAPRO) 10 MG tablet Take 10 mg by mouth at bedtime.    Yes [provider]  omeprazole (PRILOSEC) 40 MG capsule Take 1 capsule (40 mg total) by mouth 2 (two) times daily before a meal. 06/06/19  Yes Gollan, Kathlene November, MD  pravastatin (PRAVACHOL) 40 MG tablet Take 40 mg by mouth every  evening.    Yes [provider]  torsemide (DEMADEX) 20 MG tablet TAKE (1) TABLET BY MOUTH ONCE DAILY AS NEEDED. 10/05/19  Yes Gollan, Kathlene November, MD  Vitamin D, Ergocalciferol, (DRISDOL) 50000 units CAPS capsule Take 50,000 Units by mouth 2 (two) times a week. Take on Sunday and Thursday   Yes [provider]  hydrALAZINE (APRESOLINE) 25 MG tablet Take 1 tablet (25 mg total) by mouth 3 (three) times daily as needed (As needed for elevated blood pressure with top number 160-170). 06/06/19 10/31/19  Minna Merritts, MD  levothyroxine (SYNTHROID) 50 MCG tablet Take 1 tablet (50 mcg total) by mouth daily. 03/27/17 10/31/19  Vaughan Basta, MD    Allergies as of 11/02/2019 - Review Complete 10/31/2019  Allergen Reaction Noted  . Penicillins Hives and Other (See Comments) 02/05/2016  . Sulfa antibiotics Hives 02/05/2016  . Cymbalta [duloxetine hcl]  05/10/2019    Family History  Problem Relation Age of Onset  . Heart disease Sister   . Breast cancer Sister   . Diabetes Mellitus II Sister   . Cerebral aneurysm Mother   . Healthy Father   . Lung cancer Brother   . Throat cancer Brother   . Throat cancer Child     Social History   Socioeconomic History  . Marital status: Widowed    Spouse name: Not on file  . Number of children: Not on file  . Years of education: Not on file  . Highest education level: Not on file  Occupational History  . Not on file  Tobacco Use  . Smoking status: Never Smoker  . Smokeless tobacco: Never Used  Vaping Use  . Vaping Use: Never used  Substance and Sexual Activity  . Alcohol use: No  . Drug use: No  . Sexual activity: Never  Other Topics Concern  . Not on file  Social History Narrative  . Not on file   Social Determinants of Health   Financial Resource Strain:   . Difficulty of Paying Living Expenses:   Food Insecurity:   . Worried About Charity fundraiser in the Last Year:   . Arboriculturist in the Last Year:     Transportation Needs:   . Film/video editor (Medical):   Marland Kitchen Lack of Transportation (Non-Medical):   Physical Activity:   . Days of Exercise per Week:   . Minutes of Exercise per Session:   Stress:   . Feeling of Stress :   Social Connections:   . Frequency of Communication with Friends and Family:   . Frequency of Social Gatherings with Friends and Family:   . Attends Religious Services:   . Active Member of Clubs or Organizations:   . Attends Archivist Meetings:   Marland Kitchen Marital Status:   Intimate Partner Violence:   . Fear of Current or Ex-Partner:   . Emotionally Abused:   Marland Kitchen Physically Abused:   . Sexually Abused:     Review of Systems: See HPI,  otherwise negative ROS  Physical Exam: BP 137/67   Pulse 61   Temp (!) 97.2 F (36.2 C) (Temporal)   Resp 18   Ht 6' (1.829 m)   Wt 83.5 kg   SpO2 100%   BMI 24.95 kg/m  General:   Alert,  pleasant and cooperative in NAD Head:  Normocephalic and atraumatic. Neck:  Supple; no masses or thyromegaly. Lungs:  Clear throughout to auscultation, normal respiratory effort.    Heart:  +S1, +S2, Regular rate and rhythm, No edema. Abdomen:  Soft, nontender and nondistended. Normal bowel sounds, without guarding, and without rebound.   Neurologic:  Alert and  oriented x4;  grossly normal neurologically.  Impression/Plan: Theresa Cain is here for an endoscopy  to be performed for  evaluation of melena    Risks, benefits, limitations, and alternatives regarding endoscopy have been reviewed with the patient.  Questions have been answered.  All parties agreeable.   Jonathon Bellows, MD  11/07/2019, 11:00 AM

## 2019-11-07 NOTE — Anesthesia Postprocedure Evaluation (Signed)
Anesthesia Post Note  Patient: Theresa Cain  Procedure(s) Performed: ESOPHAGOGASTRODUODENOSCOPY (EGD) WITH PROPOFOL (N/A )  Patient location during evaluation: Endoscopy Anesthesia Type: General Level of consciousness: awake and alert and oriented Pain management: pain level controlled Vital Signs Assessment: post-procedure vital signs reviewed and stable Respiratory status: spontaneous breathing Cardiovascular status: blood pressure returned to baseline Anesthetic complications: no   No complications documented.   Last Vitals:  Vitals:   11/07/19 1140 11/07/19 1150  BP: 130/60 139/62  Pulse: 65 (!) 59  Resp: (!) 22 20  Temp:    SpO2: 97% 98%    Last Pain:  Vitals:   11/07/19 1131  TempSrc:   PainSc: 0-No pain                 Brelan Hannen

## 2019-11-07 NOTE — Anesthesia Preprocedure Evaluation (Addendum)
Anesthesia Evaluation  Patient identified by MRN, date of birth, ID band Patient awake    Reviewed: Allergy & Precautions, NPO status , Patient's Chart, lab work & pertinent test results  History of Anesthesia Complications Negative for: history of anesthetic complications  Airway Mallampati: II  TM Distance: >3 FB Neck ROM: Full    Dental  (+) Poor Dentition, Missing   Pulmonary neg pulmonary ROS, neg sleep apnea, neg COPD,    breath sounds clear to auscultation- rhonchi (-) wheezing      Cardiovascular hypertension, Pt. on medications + CAD, + Past MI, + Peripheral Vascular Disease and +CHF  (-) Cardiac Stents and (-) CABG + pacemaker + Valvular Problems/Murmurs (s/p TAVR)  Rhythm:Regular Rate:Normal - Systolic murmurs and - Diastolic murmurs    Neuro/Psych neg Seizures negative neurological ROS  negative psych ROS   GI/Hepatic Neg liver ROS, hiatal hernia, GERD  ,  Endo/Other  neg diabetesHypothyroidism   Renal/GU Renal InsufficiencyRenal disease     Musculoskeletal  (+) Arthritis ,   Abdominal (+) - obese,   Peds negative pediatric ROS (+)  Hematology  (+) anemia ,   Anesthesia Other Findings Past Medical History: 02/22/2016: Acute on chronic diastolic CHF (congestive heart failure)  (HCC) No date: Anemia No date: Bradycardia No date: Carotid artery occlusion No date: Chronic diastolic CHF (congestive heart failure) (HCC) No date: Chronic kidney disease     Comment:  elevated creatinne No date: Coronary artery disease involving native coronary artery of  native heart No date: GERD (gastroesophageal reflux disease) No date: HLD (hyperlipidemia) No date: Hypertension No date: Hypothyroidism No date: Obesity No date: Osteoarthritis No date: Permanent atrial fibrillation (HCC) No date: Presence of permanent cardiac pacemaker No date: PVD (peripheral vascular disease) ([place]) 05/12/2017: S/P TAVR  (transcatheter aortic valve replacement)     Comment:  23 mm Edwards Sapien 3 transcatheter heart valve placed               via percutaneous right transfemoral approach  No date: Severe aortic stenosis ~ 2013: Stomach cancer (HCC) No date: Vertigo   Reproductive/Obstetrics                             Anesthesia Physical  Anesthesia Plan  ASA: IV  Anesthesia Plan: General   Post-op Pain Management:    Induction: Intravenous  PONV Risk Score and Plan: 2 and Propofol infusion  Airway Management Planned: Natural Airway and Nasal Cannula  Additional Equipment:   Intra-op Plan:   Post-operative Plan:   Informed Consent: I have reviewed the patients History and Physical, chart, labs and discussed the procedure including the risks, benefits and alternatives for the proposed anesthesia with the patient or authorized representative who has indicated his/her understanding and acceptance.     Dental advisory given  Plan Discussed with: CRNA and Anesthesiologist  Anesthesia Plan Comments:        Anesthesia Quick Evaluation

## 2019-11-07 NOTE — Op Note (Signed)
[facility] Gastroenterology Patient Name: Theresa Cain Procedure Date: 11/07/2019 10:59 AM MRN: [id number] Account #: [id number] Date of Birth: [birth year] Admit Type: Outpatient Age: 86 Room: [facility] ENDO ROOM 4 Gender: Female Note Status: Finalized Procedure:             Upper GI endoscopy Indications:           Melena Providers:             Jonathon Bellows MD, MD Referring MD:          Bettina Gavia. Vasireddy, MD (Referring MD) Medicines:             Monitored Anesthesia Care Complications:         No immediate complications. Procedure:             Pre-Anesthesia Assessment:                        - Prior to the procedure, a History and Physical was                         performed, and patient medications, allergies and                         sensitivities were reviewed. The patient's tolerance                         of previous anesthesia was reviewed.                        - The risks and benefits of the procedure and the                         sedation options and risks were discussed with the                         patient. All questions were answered and informed                         consent was obtained.                        - ASA Grade Assessment: III - A patient with severe                         systemic disease.                        After obtaining informed consent, the endoscope was                         passed under direct vision. Throughout the procedure,                         the patient's blood pressure, pulse, and oxygen                         saturations were monitored continuously. The Endoscope                         was introduced through the mouth, and advanced  to the                         third part of duodenum. The upper GI endoscopy was                         accomplished with ease. The patient tolerated the                         procedure well. Findings:      A large, ulcerating mass with bleeding and stigmata of  recent bleeding       was found in the cardia. The mass was non-obstructing and not       circumferential. To stop active bleeding, hemostatic spray was deployed.       Multiple sprays were applied. There was no bleeding at the end of the       procedure.      The stomach was normal.      The examined duodenum was normal. 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                         Hemostatic spray applied.                        - Normal stomach.                        - Normal examined duodenum.                        - No specimens collected. Recommendation:        - Discharge patient to home (with escort).                        - Resume previous diet.                        - Continue present medications.                        - Return to GI clinic PRN. Procedure Code(s):     --- Professional ---                        [phone], Esophagogastroduodenoscopy, flexible,                         transoral; with control of bleeding, any method Diagnosis Code(s):     --- Professional ---                        C16.0, Malignant neoplasm of cardia                        K92.1, Melena (includes Hematochezia) CPT copyright 2019 American Medical Association. All rights reserved. The codes documented in this report are preliminary and upon coder review may  be revised to meet current compliance requirements. Jonathon Bellows, MD Jonathon Bellows MD, MD 11/07/2019 11:19:29 AM This report has been signed electronically. Number of Addenda: 0 Note Initiated On: 11/07/2019 10:59 AM Estimated  Blood Loss:  Estimated blood loss: none.      [facility]

## 2019-11-07 NOTE — Transfer of Care (Signed)
Immediate Anesthesia Transfer of Care Note  Patient: Theresa Cain  Procedure(s) Performed: ESOPHAGOGASTRODUODENOSCOPY (EGD) WITH PROPOFOL (N/A )  Patient Location: PACU and Endoscopy Unit  Anesthesia Type:General  Level of Consciousness: awake  Airway & Oxygen Therapy: Patient Spontanous Breathing and Patient connected to nasal cannula oxygen  Post-op Assessment: Report given to RN and Post -op Vital signs reviewed and stable  Post vital signs: Reviewed and stable  Last Vitals:  Vitals Value Taken Time  BP    Temp    Pulse 66 11/07/19 1121  Resp 24 11/07/19 1121  SpO2 100 % 11/07/19 1121  Vitals shown include unvalidated device data.  Last Pain:  Vitals:   11/07/19 0949  TempSrc: Temporal  PainSc: 0-No pain         Complications: No complications documented.

## 2019-11-08 ENCOUNTER — Encounter: Payer: Self-pay | Admitting: Gastroenterology

## 2019-11-09 ENCOUNTER — Other Ambulatory Visit: Payer: Self-pay

## 2019-11-09 ENCOUNTER — Inpatient Hospital Stay: Payer: Medicare Other

## 2019-11-09 VITALS — BP 137/71 | HR 60 | Temp 98.0°F | Resp 18

## 2019-11-09 DIAGNOSIS — D509 Iron deficiency anemia, unspecified: Secondary | ICD-10-CM

## 2019-11-09 DIAGNOSIS — K921 Melena: Secondary | ICD-10-CM | POA: Diagnosis not present

## 2019-11-09 MED ORDER — SODIUM CHLORIDE 0.9 % IV SOLN
Freq: Once | INTRAVENOUS | Status: AC
  Filled 2019-11-09: qty 250

## 2019-11-09 MED ORDER — SODIUM CHLORIDE 0.9 % IV SOLN
510.0000 mg | Freq: Once | INTRAVENOUS | Status: AC
  Administered 2019-11-09: 510 mg via INTRAVENOUS
  Filled 2019-11-09: qty 510

## 2019-11-10 ENCOUNTER — Telehealth: Payer: Self-pay | Admitting: Nurse Practitioner

## 2019-11-10 DIAGNOSIS — C160 Malignant neoplasm of cardia: Secondary | ICD-10-CM

## 2019-11-10 NOTE — Telephone Encounter (Signed)
Spoke to Collins after her mother's EGD. EGD showed large recurrent mass. Per Sunday Spillers, her mother is weak and not feeling well after her procedure. Received iron yesterday. I offered [facility] and she said she would discuss with her mom. Also recommended palliative care visit to assist with comfort based care. Patient and Sunday Spillers are agreeable. They request appt for 8/25 when she is coming for iron infusion. Offered appt sooner if needed to notify clinic.

## 2019-11-16 ENCOUNTER — Inpatient Hospital Stay: Payer: Medicare Other

## 2019-11-16 ENCOUNTER — Other Ambulatory Visit: Payer: Self-pay

## 2019-11-16 ENCOUNTER — Inpatient Hospital Stay (HOSPITAL_BASED_OUTPATIENT_CLINIC_OR_DEPARTMENT_OTHER): Payer: Medicare Other | Admitting: Hospice and Palliative Medicine

## 2019-11-16 ENCOUNTER — Telehealth: Payer: Self-pay | Admitting: *Deleted

## 2019-11-16 DIAGNOSIS — Z515 Encounter for palliative care: Secondary | ICD-10-CM | POA: Diagnosis not present

## 2019-11-16 DIAGNOSIS — C160 Malignant neoplasm of cardia: Secondary | ICD-10-CM | POA: Diagnosis not present

## 2019-11-16 NOTE — Telephone Encounter (Signed)
Daughter Theresa Cain called stating that patient is having a "bad morning" and that she does not feel that she can come in for her appointment this morning for lab and iron infusion as well as palliative care appointment, but is asking if palliative care practioner would please call her back. I spoke with Sharion Dove, NP who has agreed top call daughter.

## 2019-11-16 NOTE — Progress Notes (Signed)
Virtual Visit via Telephone Note  I connected with Theresa Cain on 11/16/19 at 10:30 AM EDT by telephone and verified that I am speaking with the correct person using two identifiers.   I discussed the limitations, risks, security and privacy concerns of performing an evaluation and management service by telephone and the availability of in person appointments. I also discussed with the patient that there may be a patient responsible charge related to this service. The patient expressed understanding and agreed to proceed.   History of Present Illness: Ms. Theresa Cain is a 86-year-old woman with multiple medical problems including history of neuroendocrine tumor.  She developed acute GI bleed status post EGD which showed gastric cardia mass and required hemospray in February 2021.  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  PET scan showed hypermetabolic mass in the gastric cardia with small lymph node in the gastrohepatic ligament.  Patient opted for XRT but declined chemotherapy or surgery.  She completed XRT in April 2021.  Patient has subsequently developed recurrence of black tarry stools.  She underwent repeat EGD on 11/07/2019 with findings of recurrence.  She was referred to palliative care to help address goals.   Observations/Objective: Patient was scheduled to see me in the clinic today but I received a call from her daughter that patient was too weak to come in.  Unable to complete my chart visit.  I called and spoke with patient and daughter by phone.  Patient endorses several black tarry stools with progressive weakness and poor appetite.  Discussed options for further work-up, labs, and possible transfusion.  Patient states that she would prefer just to remain home and be comfortable even if it means that she is at end-of-life.  She has reportedly told her daughter multiple times recently that she is ready to  die and just wants to be comfortable.  We discussed option of hospice involvement and patient and daughter were in agreement.  Will hold Eliquis.  Discussed CODE STATUS.  Patient states that she would not want to be resuscitated nor have her life prolonged artificially on machines.  I completed a DNR order, which I will mail to the home.  Assessment and Plan: GI bleed -secondary to recurrent cancer.  Hold Eliquis.  Pursue best supportive care at home.  We will send hospice referral.  DNR order was completed and will mail to the home.  Case discussed with Dr. Janese Banks  Follow Up Instructions: As needed   I discussed the assessment and treatment plan with the patient. The patient was provided an opportunity to ask questions and all were answered. The patient agreed with the plan and demonstrated an understanding of the instructions.   The patient was advised to call back or seek an in-person evaluation if the symptoms worsen or if the condition fails to improve as anticipated.  I provided 15 minutes of non-face-to-face time during this encounter.   Irean Hong, NP

## 2019-11-16 NOTE — Telephone Encounter (Signed)
Appts cancelled for lab/ infusion, Palliative care appointment changed to telephone visit

## 2019-11-18 ENCOUNTER — Other Ambulatory Visit: Payer: Self-pay | Admitting: Hospice and Palliative Medicine

## 2019-11-18 MED ORDER — MORPHINE SULFATE (CONCENTRATE) 10 MG /0.5 ML PO SOLN: 5.0000 mg | ORAL | 0 refills | Status: AC | PRN

## 2019-11-18 MED ORDER — ONDANSETRON HCL 8 MG PO TABS: 8.0000 mg | ORAL_TABLET | Freq: Three times a day (TID) | ORAL | 0 refills | Status: AC | PRN

## 2019-11-18 NOTE — Progress Notes (Signed)
I spoke with patient's hospice nurse. Rx for morphine elixir and Zofran sent to pharmacy

## 2019-11-29 ENCOUNTER — Telehealth: Payer: Self-pay | Admitting: *Deleted

## 2019-11-29 NOTE — Telephone Encounter (Signed)
SW with [facility] would like to speak with Elease Etienne regarding this patient.

## 2019-12-05 ENCOUNTER — Telehealth: Payer: Self-pay | Admitting: *Deleted

## 2019-12-05 NOTE — Telephone Encounter (Signed)
Theresa Cain, with a funeral home in [place], had questions on how to get a death certificate signed. can you please call her about this? [phone]. for [place]. thanks! I called andrea and gave her the fax number for cremation and then gave her the address to mail the original to [place]. She will fax it in am

## 2019-12-12 ENCOUNTER — Ambulatory Visit: Payer: Medicare Other | Admitting: Cardiovascular Disease

## 2019-12-21 ENCOUNTER — Ambulatory Visit: Payer: Medicare Other | Admitting: Radiation Oncology

## 2019-12-23 DEATH — deceased

## 2019-12-30 ENCOUNTER — Ambulatory Visit: Payer: Medicare Other | Admitting: Oncology

## 2019-12-30 ENCOUNTER — Other Ambulatory Visit: Payer: Medicare Other

## 2020-08-12 IMAGING — CT CT HEAD W/O CM
3 series · 15 of 47 positions shown, 18 images · non-contrast
Comparison: 05/15/2019

CLINICAL DATA: Right handed weakness

EXAM:
CT HEAD WITHOUT CONTRAST
TECHNIQUE: Contiguous axial images were obtained from the base of the skull
through the vertex without intravenous contrast.

[Series 2: head wo · axial · 0.40mm/px · z∈[-115,+10]mm · 9 of 31 slices shown, 12 images]
[im 3/31  brain]
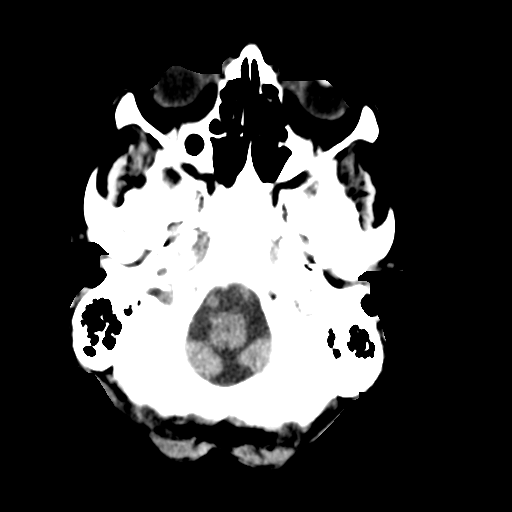
[im 3/31  bone]
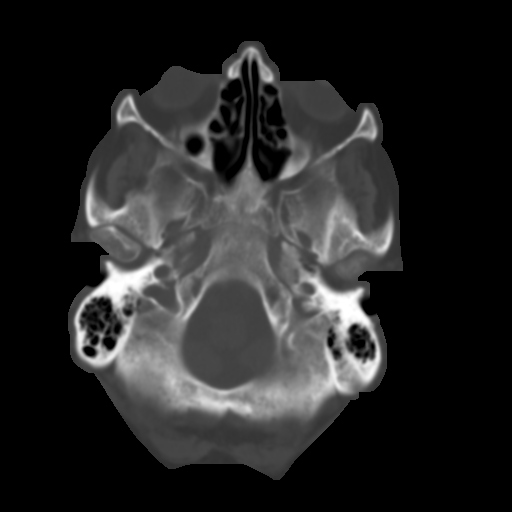
[im 6/31  brain]
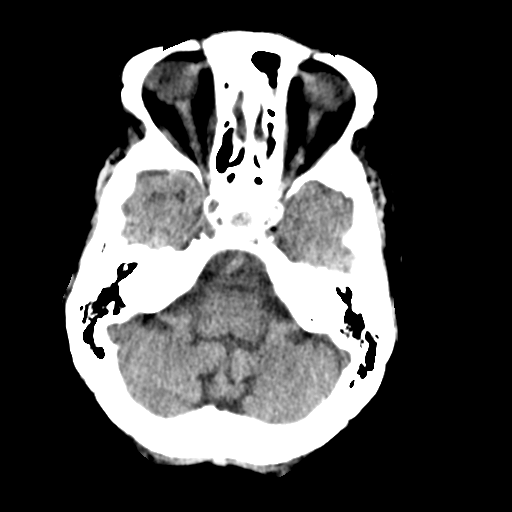
[im 9/31  brain]
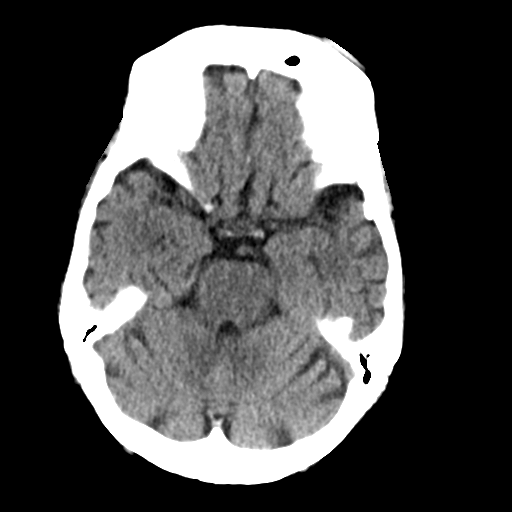
[im 12/31  brain]
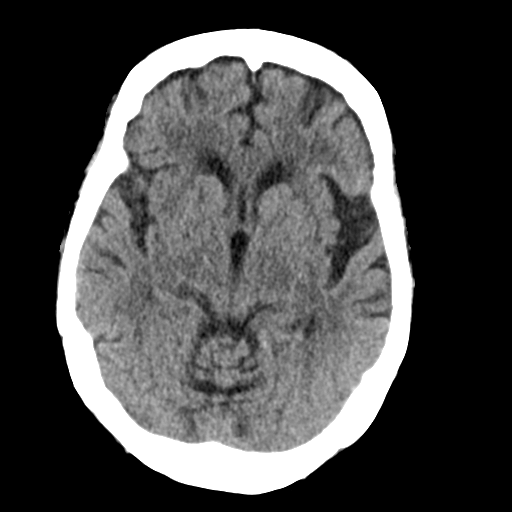
[im 16/31  brain]
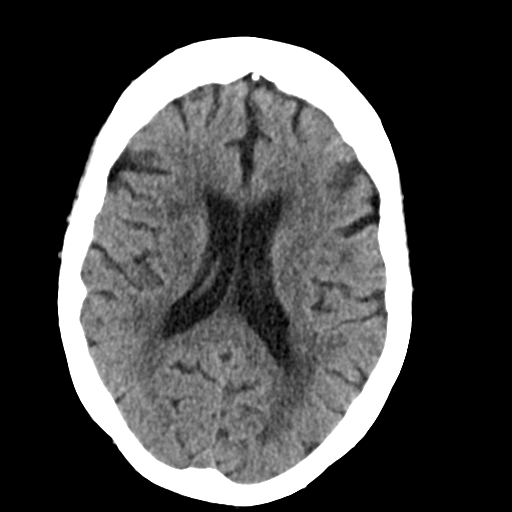
[im 16/31  bone]
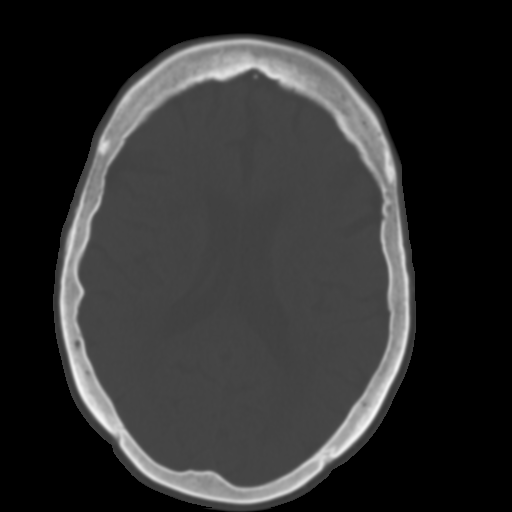
[im 19/31  brain]
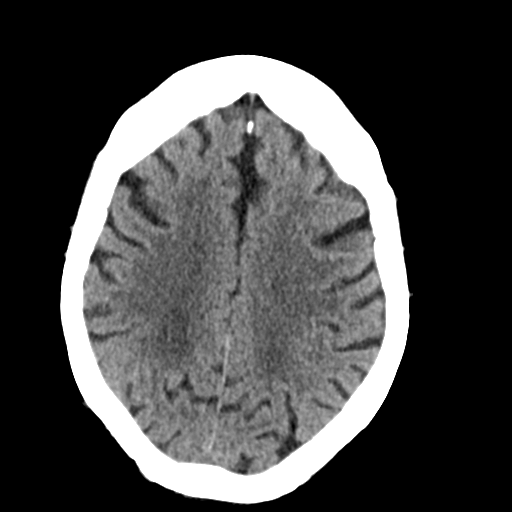
[im 22/31  brain]
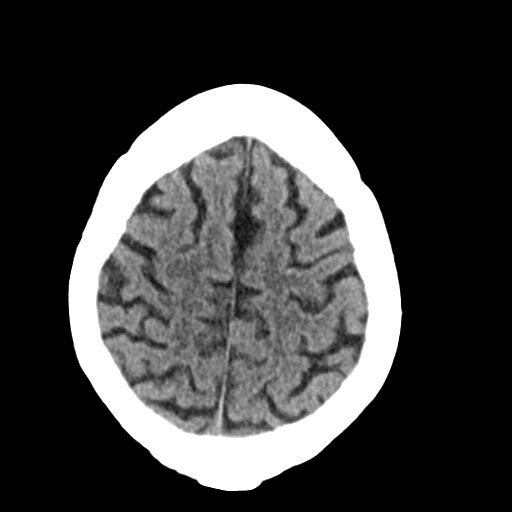
[im 25/31  brain]
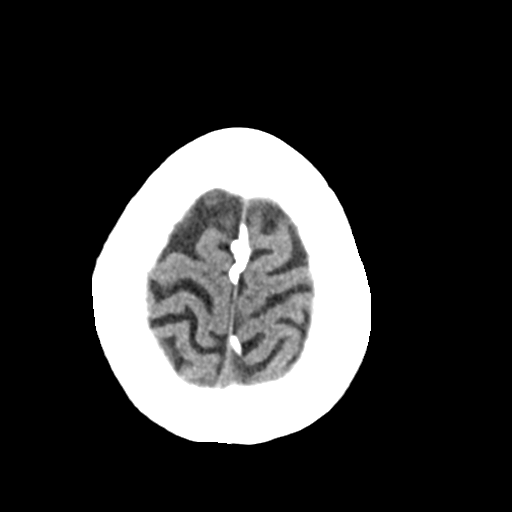
[im 28/31  brain]
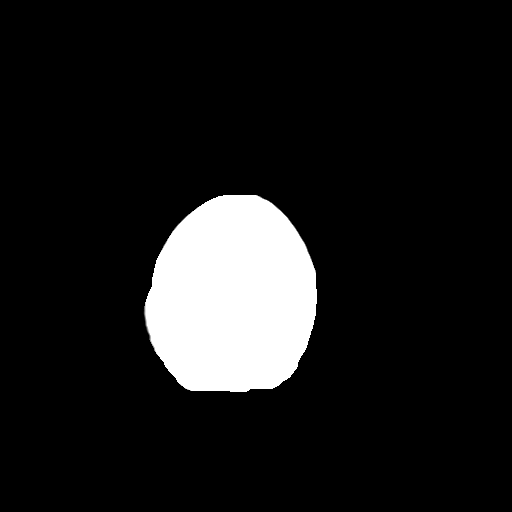
[im 28/31  bone]
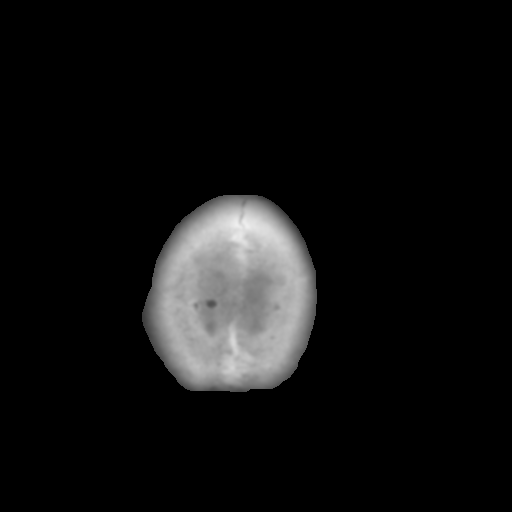

[Series 4: coronal soft tissue · coronal · 0.30mm/px · 3 of 67 slices shown]
[im 23/67  brain]
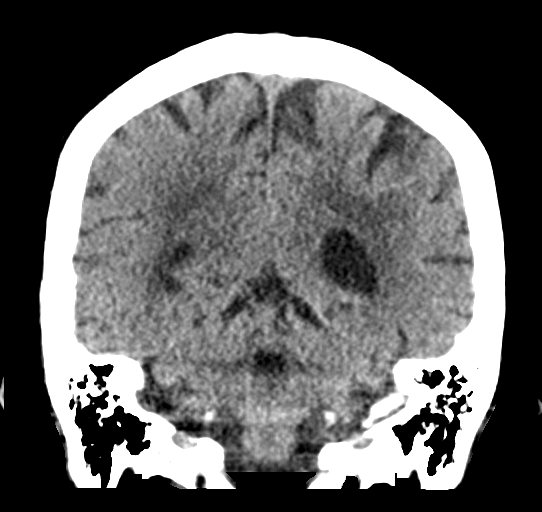
[im 30/67  brain]
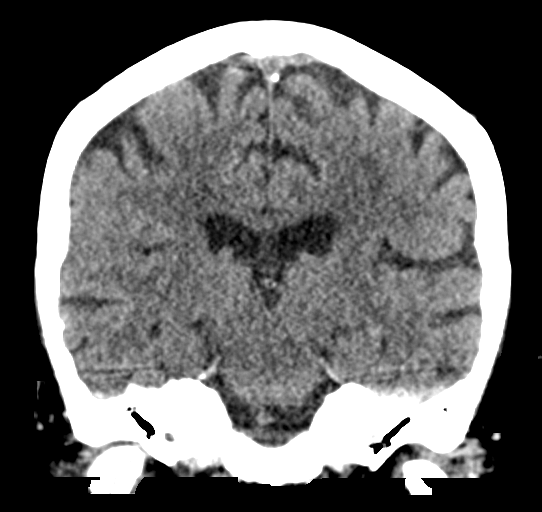
[im 37/67  brain]
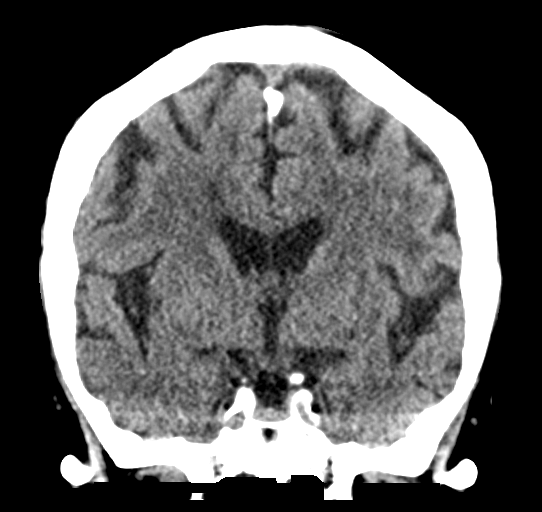

[Series 5: sagittal soft tissue · sagittal · 0.30mm/px · 3 of 55 slices shown]
[im 19/55  brain]
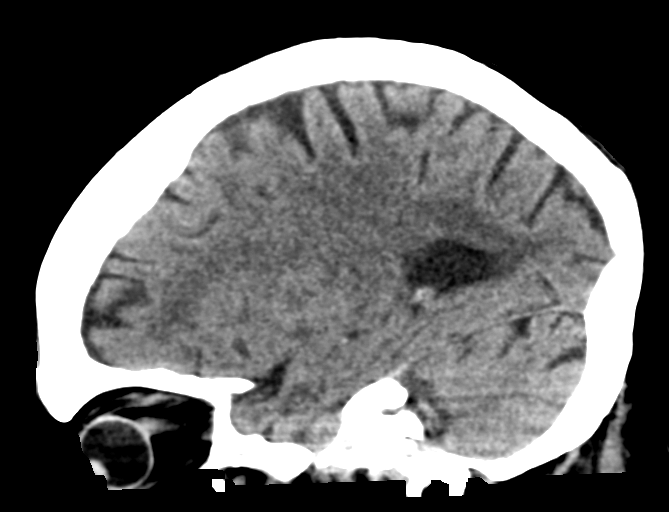
[im 28/55  brain]
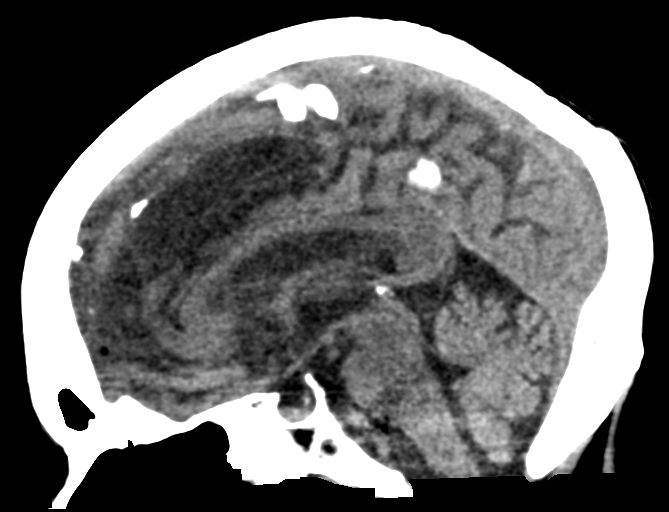
[im 37/55  brain]
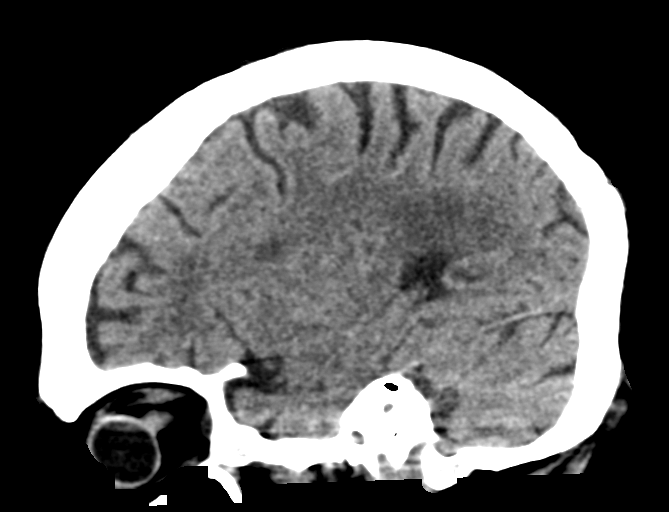

[15 of 47 positions shown; findings below may reference images not displayed]

FINDINGS: Brain: No evidence of acute infarction, hemorrhage, hydrocephalus,
extra-axial collection or mass lesion/mass effect. Extensive
low-density changes within the periventricular and subcortical white
matter compatible with chronic microvascular ischemic change. Mild
diffuse cerebral volume loss.

Vascular: Mild atherosclerotic calcifications involving the large
vessels of the skull base. No unexpected hyperdense vessel.

Skull: Normal. Negative for fracture or focal lesion.

Sinuses/Orbits: Mild bilateral paranasal sinus mucosal thickening.
Mastoid air cells are clear. Orbital structures unremarkable.

Other: None.
IMPRESSION: 1. No CT evidence of acute intracranial pathology. No interval
change from prior 05/15/2019.
2. Chronic microvascular ischemic changes and cerebral volume loss.
3. Mild paranasal sinus disease.
